# Patient Record
Sex: Female | Born: 1990 | Race: White | Hispanic: No | Smoking: Former smoker
Health system: Southern US, Community
[De-identification: ages and names within clinical notes are randomized; demographics above are authoritative.]

## PROBLEM LIST (undated history)

## (undated) ENCOUNTER — Inpatient Hospital Stay (HOSPITAL_COMMUNITY): Payer: Medicaid Other

## (undated) DIAGNOSIS — F419 Anxiety disorder, unspecified: Secondary | ICD-10-CM

## (undated) DIAGNOSIS — F32A Depression, unspecified: Secondary | ICD-10-CM

## (undated) HISTORY — DX: Anxiety disorder, unspecified: F41.9

## (undated) HISTORY — DX: Depression, unspecified: F32.A

## (undated) HISTORY — PX: NO PAST SURGERIES: SHX2092

---
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
## 2015-06-24 ENCOUNTER — Emergency Department
Admission: EM | Admit: 2015-06-24 | Discharge: 2015-06-24 | Disposition: A | Discharge status: Home / Self Care | Payer: Self-pay | Attending: Emergency Medicine | Admitting: Emergency Medicine

## 2015-06-24 ENCOUNTER — Encounter: Payer: Self-pay | Admitting: Emergency Medicine

## 2015-06-24 DIAGNOSIS — Z720 Tobacco use: Secondary | ICD-10-CM | POA: Insufficient documentation

## 2015-06-24 DIAGNOSIS — L509 Urticaria, unspecified: Secondary | ICD-10-CM | POA: Insufficient documentation

## 2015-06-24 MED ORDER — FAMOTIDINE 20 MG PO TABS
ORAL_TABLET | ORAL | Status: AC
  Administered 2015-06-24: 20 mg via ORAL
  Filled 2015-06-24: qty 1

## 2015-06-24 MED ORDER — DIPHENHYDRAMINE HCL 50 MG PO CAPS
ORAL_CAPSULE | ORAL | Status: AC
  Administered 2015-06-24: 50 mg via ORAL
  Filled 2015-06-24: qty 1

## 2015-06-24 MED ORDER — PREDNISONE 20 MG PO TABS: 40.0000 mg | ORAL_TABLET | Freq: Every day | ORAL | Status: AC

## 2015-06-24 MED ORDER — EPINEPHRINE 0.3 MG/0.3ML IJ SOAJ: 0.3000 mg | Freq: Once | INTRAMUSCULAR | Status: DC

## 2015-06-24 MED ORDER — PREDNISONE 20 MG PO TABS
60.0000 mg | ORAL_TABLET | Freq: Once | ORAL | Status: AC
  Administered 2015-06-24: 60 mg via ORAL

## 2015-06-24 MED ORDER — PREDNISONE 20 MG PO TABS
ORAL_TABLET | ORAL | Status: AC
  Administered 2015-06-24: 60 mg via ORAL
  Filled 2015-06-24: qty 3

## 2015-06-24 MED ORDER — FAMOTIDINE 20 MG PO TABS
20.0000 mg | ORAL_TABLET | Freq: Once | ORAL | Status: AC
  Administered 2015-06-24: 20 mg via ORAL

## 2015-06-24 MED ORDER — DIPHENHYDRAMINE HCL 50 MG PO CAPS
50.0000 mg | ORAL_CAPSULE | Freq: Once | ORAL | Status: AC
  Administered 2015-06-24: 50 mg via ORAL

## 2015-06-24 NOTE — ED Notes (Signed)
Patient stable at discharge.

## 2015-06-24 NOTE — ED Provider Notes (Signed)
[facility] Emergency Department Provider Note  ____________________________________________  Time seen: 6:15 AM  I have reviewed the triage vital signs and the nursing notes.   HISTORY  Chief Complaint Allergic Reaction     HPI Danielle Gillespie is a 24 y.o. female presents with generalize pruritic rash acute onset last night. Patient states after returning indoor from outdoor she started to experience generalized pruritus with resultant hives. Patient denies any difficulty breathing no difficulty swallowing. Patient denies any previous history of allergies.    Past medical history None  Past surgical history None Allergies No known drug allergies No family history on file.  Social History History  Substance Use Topics  . Smoking status: Current Every Day Smoker -- 1.00 packs/day    Types: Cigarettes  . Smokeless tobacco: Not on file  . Alcohol Use: No    Review of Systems  Constitutional: Negative for fever. Eyes: Negative for visual changes. ENT: Negative for sore throat. Cardiovascular: Negative for chest pain. Respiratory: Negative for shortness of breath. Gastrointestinal: Negative for abdominal pain, vomiting and diarrhea. Genitourinary: Negative for dysuria. Musculoskeletal: Negative for back pain. Skin: Positive for rash. Neurological: Negative for headaches, focal weakness or numbness.   10-point ROS otherwise negative.  ____________________________________________   PHYSICAL EXAM:  VITAL SIGNS: ED Triage Vitals  Enc Vitals Group     BP 06/24/15 0454 114/57 mmHg     Pulse Rate 06/24/15 0454 60     Resp 06/24/15 0454 18     Temp 06/24/15 0454 98 F (36.7 C)     Temp Source 06/24/15 0454 Oral     SpO2 06/24/15 0454 99 %     Weight 06/24/15 0454 125 lb (56.7 kg)     Height 06/24/15 0454 5\' 2"  (1.575 m)     Head Cir --      Peak Flow --      Pain Score --      Pain Loc --      Pain Edu? --      Excl. in GC? --       Constitutional: Alert and oriented. Well appearing and in no distress. Eyes: Conjunctivae are normal. PERRL. Normal extraocular movements. ENT   Head: Normocephalic and atraumatic.   Nose: No congestion/rhinnorhea.   Mouth/Throat: Mucous membranes are moist.   Neck: No stridor. Cardiovascular: Normal rate, regular rhythm. Normal and symmetric distal pulses are present in all extremities. No murmurs, rubs, or gallops. Respiratory: Normal respiratory effort without tachypnea nor retractions. Breath sounds are clear and equal bilaterally. No wheezes/rales/rhonchi. Gastrointestinal: Soft and nontender. No distention. There is no CVA tenderness. Genitourinary: deferred Musculoskeletal: Nontender with normal range of motion in all extremities. No joint effusions.  No lower extremity tenderness nor edema. Neurologic:  Normal speech and language. No gross focal neurologic deficits are appreciated. Speech is normal.  Skin:  Generalized urticaria involving posterior neck abdomen back and arms. Psychiatric: Mood and affect are normal. Speech and behavior are normal. Patient exhibits appropriate insight and judgment.      INITIAL IMPRESSION / ASSESSMENT AND PLAN / ED COURSE  Pertinent labs & imaging results that were available during my care of the patient were reviewed by me and considered in my medical decision making (see chart for details).  History of physical exam consistent with hives/urticaria most likely secondary to allergic reaction to unknown agent.  ____________________________________________   FINAL CLINICAL IMPRESSION(S) / ED DIAGNOSES  Final diagnoses:  Hives      Darci Current,  MD 06/25/15 0532

## 2015-06-24 NOTE — ED Notes (Signed)
Patient ambulatory to triage with steady gait, without difficulty or distress noted; pt reports since last night noted some generalized itching; awoke with some hives noted, no known cause

## 2015-06-24 NOTE — Discharge Instructions (Signed)
Hives Hives are itchy, red, swollen areas of the skin. They can vary in size and location on your body. Hives can come and go for hours or several days (acute hives) or for several weeks (chronic hives). Hives do not spread from person to person (noncontagious). They may get worse with scratching, exercise, and emotional stress. CAUSES   Allergic reaction to food, additives, or drugs.  Infections, including the common cold.  Illness, such as vasculitis, lupus, or thyroid disease.  Exposure to sunlight, heat, or cold.  Exercise.  Stress.  Contact with chemicals. SYMPTOMS   Red or white swollen patches on the skin. The patches may change size, shape, and location quickly and repeatedly.  Itching.  Swelling of the hands, feet, and face. This may occur if hives develop deeper in the skin. DIAGNOSIS  Your caregiver can usually tell what is wrong by performing a physical exam. Skin or blood tests may also be done to determine the cause of your hives. In some cases, the cause cannot be determined. TREATMENT  Mild cases usually get better with medicines such as antihistamines. Severe cases may require an emergency epinephrine injection. If the cause of your hives is known, treatment includes avoiding that trigger.  HOME CARE INSTRUCTIONS   Avoid causes that trigger your hives.  Take antihistamines as directed by your caregiver to reduce the severity of your hives. Non-sedating or low-sedating antihistamines are usually recommended. Do not drive while taking an antihistamine.  Take any other medicines prescribed for itching as directed by your caregiver.  Wear loose-fitting clothing.  Keep all follow-up appointments as directed by your caregiver. SEEK MEDICAL CARE IF:   You have persistent or severe itching that is not relieved with medicine.  You have painful or swollen joints. SEEK IMMEDIATE MEDICAL CARE IF:   You have a fever.  Your tongue or lips are swollen.  You have  trouble breathing or swallowing.  You feel tightness in the throat or chest.  You have abdominal pain. These problems may be the first sign of a life-threatening allergic reaction. Call your local emergency services (911 in [place] MAKE SURE YOU:   Understand these instructions.  Will watch your condition.  Will get help right away if you are not doing well or get worse. Document Released: 11/20/2005 Document Revised: 11/25/2013 Document Reviewed: 02/13/2012 Exit[employer] Patient Information 2015 [employer]. This information is not intended to replace advice given to you by your health care provider. Make sure you discuss any questions you have with your health care provider.  

## 2017-01-23 ENCOUNTER — Emergency Department
Admission: EM | Admit: 2017-01-23 | Discharge: 2017-01-23 | Disposition: A | Discharge status: Home / Self Care | Payer: Self-pay | Attending: Emergency Medicine | Admitting: Emergency Medicine

## 2017-01-23 DIAGNOSIS — Z79899 Other long term (current) drug therapy: Secondary | ICD-10-CM | POA: Insufficient documentation

## 2017-01-23 DIAGNOSIS — B349 Viral infection, unspecified: Secondary | ICD-10-CM | POA: Insufficient documentation

## 2017-01-23 DIAGNOSIS — R69 Illness, unspecified: Secondary | ICD-10-CM

## 2017-01-23 DIAGNOSIS — F17210 Nicotine dependence, cigarettes, uncomplicated: Secondary | ICD-10-CM | POA: Insufficient documentation

## 2017-01-23 DIAGNOSIS — J111 Influenza due to unidentified influenza virus with other respiratory manifestations: Secondary | ICD-10-CM

## 2017-01-23 LAB — INFLUENZA PANEL BY PCR (TYPE A & B)
Influenza A By PCR: NEGATIVE
Influenza B By PCR: NEGATIVE

## 2017-01-23 MED ORDER — IBUPROFEN 600 MG PO TABS: 600.0000 mg | ORAL_TABLET | Freq: Three times a day (TID) | ORAL | 0 refills | Status: DC | PRN

## 2017-01-23 MED ORDER — PSEUDOEPH-BROMPHEN-DM 30-2-10 MG/5ML PO SYRP: 5.0000 mL | ORAL_SOLUTION | Freq: Four times a day (QID) | ORAL | 0 refills | Status: DC | PRN

## 2017-01-23 MED ORDER — ONDANSETRON HCL 8 MG PO TABS: 8.0000 mg | ORAL_TABLET | Freq: Three times a day (TID) | ORAL | 0 refills | Status: DC | PRN

## 2017-01-23 MED ORDER — ACYCLOVIR 400 MG PO TABS: 400.0000 mg | ORAL_TABLET | Freq: Every day | ORAL | 0 refills | Status: AC

## 2017-01-23 MED ORDER — ONDANSETRON 8 MG PO TBDP
8.0000 mg | ORAL_TABLET | Freq: Once | ORAL | Status: AC
  Administered 2017-01-23: 8 mg via ORAL
  Filled 2017-01-23: qty 1

## 2017-01-23 MED ORDER — HYDROXYZINE HCL 50 MG PO TABS: 50.0000 mg | ORAL_TABLET | Freq: Three times a day (TID) | ORAL | 0 refills | Status: DC | PRN

## 2017-01-23 MED ORDER — KETOROLAC TROMETHAMINE 60 MG/2ML IM SOLN
60.0000 mg | Freq: Once | INTRAMUSCULAR | Status: AC
  Administered 2017-01-23: 60 mg via INTRAMUSCULAR
  Filled 2017-01-23: qty 2

## 2017-01-23 NOTE — ED Notes (Signed)
See triage note  States she developed fever,nasal congestion and body yesterday  Afebrile on arrival

## 2017-01-23 NOTE — ED Provider Notes (Signed)
[facility] Emergency Department Provider Note  ____________________________________________   First MD Initiated Contact with Patient 01/23/17 1112     (approximate)  I have reviewed the triage vital signs and the nursing notes.   HISTORY  Chief Complaint Fever    HPI Danielle Gillespie is a 26 y.o. female patient complaining of fever, bodyaches, nasal congestion for 3 days. Patient also state there is nausea but no vomiting. Patient states she has diarrhea. No palliative measures for this complaint. Patient has not taken a flu shot this season. She rates the pain as a 7/10. Patient however pain as "achy".   No past medical history on file.  There are no active problems to display for this patient.   No past surgical history on file.  Prior to Admission medications   Medication Sig Start Date End Date Taking? Authorizing Provider  acyclovir (ZOVIRAX) 400 MG tablet Take 1 tablet (400 mg total) by mouth 5 (five) times daily. 01/23/17 02/02/17  Joni Reining, PA-C  brompheniramine-pseudoephedrine-DM 30-2-10 MG/5ML syrup Take 5 mLs by mouth 4 (four) times daily as needed. 01/23/17   Joni Reining, PA-C  EPINEPHrine (EPIPEN 2-PAK) 0.3 mg/0.3 mL IJ SOAJ injection Inject 0.3 mLs (0.3 mg total) into the muscle once. 06/24/15   Darci Current, MD  hydrOXYzine (ATARAX/VISTARIL) 50 MG tablet Take 1 tablet (50 mg total) by mouth 3 (three) times daily as needed. 01/23/17   Joni Reining, PA-C  ibuprofen (ADVIL,MOTRIN) 600 MG tablet Take 1 tablet (600 mg total) by mouth every 8 (eight) hours as needed. 01/23/17   Joni Reining, PA-C  ondansetron (ZOFRAN) 8 MG tablet Take 1 tablet (8 mg total) by mouth every 8 (eight) hours as needed for nausea or vomiting. 01/23/17   Joni Reining, PA-C    Allergies Patient has no known allergies.  No family history on file.  Social History Social History  Substance Use Topics  . Smoking status: Current Every Day Smoker   Packs/day: 1.00    Types: Cigarettes  . Smokeless tobacco: Not on file  . Alcohol use No    Review of Systems Constitutional: Fever and body ache Eyes: No visual changes. ENT: No throat and nasal congestion Cardiovascular: Denies chest pain. Respiratory: Denies shortness of breath. Gastrointestinal: No abdominal pain.  No nausea, no vomiting.  No diarrhea.  No constipation. Genitourinary: Negative for dysuria. Musculoskeletal: Negative for back pain. Skin: Negative for rash. Neurological: Negative for headaches, focal weakness or numbness.    ____________________________________________   PHYSICAL EXAM:  VITAL SIGNS: ED Triage Vitals  Enc Vitals Group     BP 01/23/17 1026 100/60     Pulse Rate 01/23/17 1026 94     Resp 01/23/17 1026 18     Temp 01/23/17 1026 98.1 F (36.7 C)     Temp Source 01/23/17 1026 Oral     SpO2 01/23/17 1026 100 %     Weight 01/23/17 1026 145 lb (65.8 kg)     Height 01/23/17 1026 5\' 3"  (1.6 m)     Head Circumference --      Peak Flow --      Pain Score 01/23/17 1032 7     Pain Loc --      Pain Edu? --      Excl. in GC? --     Constitutional: Alert and oriented. Well appearing and in no acute distress. Eyes: Conjunctivae are normal. PERRL. EOMI. Head: Atraumatic. Nose: Edematous nasal turbinates with  clear rhinorrhea Mouth/Throat: Mucous membranes are moist.  Oropharynx non-erythematous. Neck: No stridor.  No cervical spine tenderness to palpation. Hematological/Lymphatic/Immunilogical: No cervical lymphadenopathy. Cardiovascular: Normal rate, regular rhythm. Grossly normal heart sounds.  Good peripheral circulation. Respiratory: Normal respiratory effort.  No retractions. Lungs CTAB. Gastrointestinal: Soft and nontender. No distention. No abdominal bruits. No CVA tenderness. Musculoskeletal: No lower extremity tenderness nor edema.  No joint effusions. Neurologic:  Normal speech and language. No gross focal neurologic deficits are  appreciated. No gait instability. Skin:  Skin is warm, dry and intact. No rash noted. Psychiatric: Mood and affect are normal. Speech and behavior are normal.  ____________________________________________   LABS (all labs ordered are listed, but only abnormal results are displayed)  Labs Reviewed  INFLUENZA PANEL BY PCR (TYPE A & B)   ____________________________________________  EKG   ____________________________________________  RADIOLOGY   ____________________________________________   PROCEDURES  Procedure(s) performed: None  Procedures  Critical Care performed: No  ____________________________________________   INITIAL IMPRESSION / ASSESSMENT AND PLAN / ED COURSE  Pertinent labs & imaging results that were available during my care of the patient were reviewed by me and considered in my medical decision making (see chart for details).  Viral illness. Patient advised  test was negative. Patient given discharge care instructions. Skin prescription from [facility] and ibuprofen. Patient given work excuse. Patient advised to [facility].      ____________________________________________   FINAL CLINICAL IMPRESSION(S) / ED DIAGNOSES  Final diagnoses:  Influenza-like illness      NEW MEDICATIONS STARTED DURING THIS VISIT:  New Prescriptions   ACYCLOVIR (ZOVIRAX) 400 MG TABLET    Take 1 tablet (400 mg total) by mouth 5 (five) times daily.   BROMPHENIRAMINE-PSEUDOEPHEDRINE-DM 30-2-10 MG/5ML SYRUP    Take 5 mLs by mouth 4 (four) times daily as needed.   HYDROXYZINE (ATARAX/VISTARIL) 50 MG TABLET    Take 1 tablet (50 mg total) by mouth 3 (three) times daily as needed.   IBUPROFEN (ADVIL,MOTRIN) 600 MG TABLET    Take 1 tablet (600 mg total) by mouth every 8 (eight) hours as needed.   ONDANSETRON (ZOFRAN) 8 MG TABLET    Take 1 tablet (8 mg total) by mouth every 8 (eight) hours as needed for nausea or vomiting.     Note:   This document was prepared using Dragon voice recognition software and may include unintentional dictation errors.    Joni Reiningonald K Chas Axel, PA-C 01/23/17 1247    Phineas SemenGraydon Goodman, MD 01/23/17 [phone]

## 2017-01-23 NOTE — ED Triage Notes (Signed)
Pt reports she began having fever, body aches nasal congestion Sunday.

## 2017-01-23 NOTE — ED Notes (Signed)
Pt alert and oriented X4, active, cooperative, pt in NAD. RR even and unlabored, color WNL.  Pt informed to return if any life threatening symptoms occur.   

## 2020-04-20 ENCOUNTER — Ambulatory Visit (INDEPENDENT_AMBULATORY_CARE_PROVIDER_SITE_OTHER): Payer: Medicaid Other | Admitting: Obstetrics and Gynecology

## 2020-04-20 ENCOUNTER — Other Ambulatory Visit: Payer: Self-pay

## 2020-04-20 ENCOUNTER — Encounter: Payer: Self-pay | Admitting: Obstetrics and Gynecology

## 2020-04-20 VITALS — BP 111/74 | HR 89 | Ht 63.0 in | Wt 168.4 lb

## 2020-04-20 DIAGNOSIS — N912 Amenorrhea, unspecified: Secondary | ICD-10-CM | POA: Diagnosis not present

## 2020-04-20 LAB — POCT URINE PREGNANCY: Preg Test, Ur: POSITIVE — AB

## 2020-04-20 NOTE — Progress Notes (Signed)
HPI:      [name] is a 29 y.o. G1P0 who LMP was Patient's last menstrual period was 03/01/2020.  Subjective:   She presents today for pregnancy confirmation.  She had been told earlier in her life that she could not become pregnant and so has had unprotected intercourse for more than 10 years.  She has now become pregnant.  She thought the reason for her inability to conceive had to do with cervical issues and some kind of a "dye test" that told her she had cancer of the cervix.  She reports normal Pap smears in the last few years.    Hx: The following portions of the patient's history were reviewed and updated as appropriate:             She  has no past medical history on file. She does not have a problem list on file. She  has no past surgical history on file. Her family history is not on file. She  reports that she quit smoking about 2 weeks ago. Her smoking use included cigarettes. She smoked 1.00 pack per day. She has never used smokeless tobacco. She reports previous drug use. She reports that she does not drink alcohol. She currently has no medications in their medication list. She has No Known Allergies.       Review of Systems:  Review of Systems  Constitutional: Denied constitutional symptoms, night sweats, recent illness, fatigue, fever, insomnia and weight loss.  Eyes: Denied eye symptoms, eye pain, photophobia, vision change and visual disturbance.  Ears/Nose/Throat/Neck: Denied ear, nose, throat or neck symptoms, hearing loss, nasal discharge, sinus congestion and sore throat.  Cardiovascular: Denied cardiovascular symptoms, arrhythmia, chest pain/pressure, edema, exercise intolerance, orthopnea and palpitations.  Respiratory: Denied pulmonary symptoms, asthma, pleuritic pain, productive sputum, cough, dyspnea and wheezing.  Gastrointestinal: Denied, gastro-esophageal reflux, melena, nausea and vomiting.  Genitourinary: Denied genitourinary symptoms including  symptomatic vaginal discharge, pelvic relaxation issues, and urinary complaints.  Musculoskeletal: Denied musculoskeletal symptoms, stiffness, swelling, muscle weakness and myalgia.  Dermatologic: Denied dermatology symptoms, rash and scar.  Neurologic: Denied neurology symptoms, dizziness, headache, neck pain and syncope.  Psychiatric: Denied psychiatric symptoms, anxiety and depression.  Endocrine: Denied endocrine symptoms including hot flashes and night sweats.   Meds:   No current outpatient medications on file prior to visit.   No current facility-administered medications on file prior to visit.    Objective:     Vitals:   04/20/20 1342  BP: 111/74  Pulse: 89              UPT positive  Assessment:    G1P0 There are no problems to display for this patient.    1. Amenorrhea     Approximately 7-week estimated gestational age based on LMP   Plan:            Prenatal Plan 1.  The patient was given prenatal literature. 2.  She was begun on prenatal vitamins. 3.  A prenatal lab panel was ordered or drawn. 4.  An ultrasound was ordered to better determine an EDC. 5.  A nurse visit was scheduled. 6.  Genetic testing and testing for other inheritable conditions discussed in detail. She will decide in the future whether to have these labs performed. 7.  A general overview of pregnancy testing, visit schedule, ultrasound schedule, and prenatal care was discussed. 8.  COVID and its risks associated with pregnancy, prevention by limiting exposure and use of masks, as well as  the risks and benefits of vaccination during pregnancy were discussed in detail.  [facility] policy regarding office and hospital visitation and testing was explained. 9.  Benefits of breast-feeding discussed in detail including both maternal and infant benefits. Ready Set Baby website discussed.   Orders Orders Placed This Encounter  Procedures  . US OB Transvaginal  . US OB Comp Less 14 Wks  . POCT  urine pregnancy    No orders of the defined types were placed in this encounter.     F/U  Return in about 5 weeks (around 05/25/2020). I spent 33 minutes involved in the care of this patient preparing to see the patient by obtaining and reviewing her medical history (including labs, imaging tests and prior procedures), documenting clinical information in the electronic health record (EHR), counseling and coordinating care plans, writing and sending prescriptions, ordering tests or procedures and directly communicating with the patient by discussing pertinent items from her history and physical exam as well as detailing my assessment and plan as noted above so that she has an informed understanding.  All of her questions were answered.  Danielle Gillespie, M.D. 04/20/2020 2:19 PM

## 2020-04-27 ENCOUNTER — Other Ambulatory Visit: Payer: Self-pay

## 2020-04-27 ENCOUNTER — Ambulatory Visit (INDEPENDENT_AMBULATORY_CARE_PROVIDER_SITE_OTHER): Payer: Medicaid Other

## 2020-04-27 DIAGNOSIS — Z3A01 Less than 8 weeks gestation of pregnancy: Secondary | ICD-10-CM

## 2020-04-27 DIAGNOSIS — N912 Amenorrhea, unspecified: Secondary | ICD-10-CM

## 2020-05-02 ENCOUNTER — Telehealth: Payer: Self-pay | Admitting: Nurse Practitioner

## 2020-05-02 ENCOUNTER — Ambulatory Visit (INDEPENDENT_AMBULATORY_CARE_PROVIDER_SITE_OTHER)
Admission: RE | Admit: 2020-05-02 | Discharge: 2020-05-02 | Disposition: A | Discharge status: Home / Self Care | Payer: Self-pay | Source: Ambulatory Visit

## 2020-05-02 DIAGNOSIS — Z3490 Encounter for supervision of normal pregnancy, unspecified, unspecified trimester: Secondary | ICD-10-CM

## 2020-05-02 DIAGNOSIS — J00 Acute nasopharyngitis [common cold]: Secondary | ICD-10-CM

## 2020-05-02 DIAGNOSIS — B3731 Acute candidiasis of vulva and vagina: Secondary | ICD-10-CM

## 2020-05-02 DIAGNOSIS — R509 Fever, unspecified: Secondary | ICD-10-CM

## 2020-05-02 DIAGNOSIS — N760 Acute vaginitis: Secondary | ICD-10-CM

## 2020-05-02 MED ORDER — FLUTICASONE PROPIONATE 50 MCG/ACT NA SUSP: 2.0000 | Freq: Every day | NASAL | 6 refills | Status: DC

## 2020-05-02 MED ORDER — CLOTRIMAZOLE 2 % VA CREA: 1.0000 | TOPICAL_CREAM | Freq: Every day | VAGINAL | 0 refills | Status: AC

## 2020-05-02 NOTE — Discharge Instructions (Addendum)
Start clotrimazole as directed. Continue to monitor, contact OBGYN if symptoms not improving, or recheck as scheduled. If having vaginal bleeding, abdominal pain, go to [facility] ED

## 2020-05-02 NOTE — Progress Notes (Signed)
We are sorry you are not feeling well.  Here is how we plan to help!  Based on what you have shared with me, it looks like you may have a viral upper respiratory infection.  Upper respiratory infections are caused by a large number of viruses; however, rhinovirus is the most common cause.   Symptoms vary from person to person, with common symptoms including sore throat, cough, fatigue or lack of energy and feeling of general discomfort.  A low-grade fever of up to 100.4 may present, but is often uncommon.  Symptoms vary however, and are closely related to a person's age or underlying illnesses.  The most common symptoms associated with an upper respiratory infection are nasal discharge or congestion, cough, sneezing, headache and pressure in the ears and face.  These symptoms usually persist for about 3 to 10 days, but can last up to 2 weeks.  It is important to know that upper respiratory infections do not cause serious illness or complications in most cases.    Upper respiratory infections can be transmitted from person to person, with the most common method of transmission being a person's hands.  The virus is able to live on the skin and can infect other persons for up to 2 hours after direct contact.  Also, these can be transmitted when someone coughs or sneezes; thus, it is important to cover the mouth to reduce this risk.  To keep the spread of the illness at [place], good hand hygiene is very important.  This is an infection that is most likely caused by a virus. There are no specific treatments other than to help you with the symptoms until the infection runs its course.  We are sorry you are not feeling well.  Here is how we plan to help!   For nasal congestion, you may use an oral decongestants such as Mucinex D or if you have glaucoma or high blood pressure use plain Mucinex.  Saline nasal spray or nasal drops can help and can safely be used as often as needed for congestion.  For your congestion,  I have prescribed Fluticasone nasal spray one spray in each nostril twice a day  If you do not have a history of heart disease, hypertension, diabetes or thyroid disease, prostate/bladder issues or glaucoma, you may also use Sudafed to treat nasal congestion.  It is highly recommended that you consult with a pharmacist or your primary care physician to ensure this medication is safe for you to take.     If you have a cough, you may use cough suppressants such as  Robitussin is safe during pregnancy.       If you have a sore or scratchy throat, use a saltwater gargle-  to  teaspoon of salt dissolved in a 4-ounce to 8-ounce glass of warm water.  Gargle the solution for approximately 15-30 seconds and then spit.  It is important not to swallow the solution.  You can also use throat lozenges/cough drops and Chloraseptic spray to help with throat pain or discomfort.  Warm or cold liquids can also be helpful in relieving throat pain.  For headache, pain or general discomfort, you can use Ibuprofen or Tylenol as directed.   Some authorities believe that zinc sprays or the use of Echinacea may shorten the course of your symptoms.   HOME CARE . Only take medications as instructed by your medical team. . Be sure to drink plenty of fluids. Water is fine as well as fruit  juices, sodas and electrolyte beverages. You may want to stay away from caffeine or alcohol. If you are nauseated, try taking small sips of liquids. How do you know if you are getting enough fluid? Your urine should be a pale yellow or almost colorless. . Get rest. . Taking a steamy shower or using a humidifier may help nasal congestion and ease sore throat pain. You can place a towel over your head and breathe in the steam from hot water coming from a faucet. . Using a saline nasal spray works much the same way. . Cough drops, hard candies and sore throat lozenges may ease your cough. . Avoid close contacts especially the very young and  the elderly . Cover your mouth if you cough or sneeze . Always remember to wash your hands.   GET HELP RIGHT AWAY IF: . You develop worsening fever. . If your symptoms do not improve within 10 days . You develop yellow or green discharge from your nose over 3 days. . You have coughing fits . You develop a severe head ache or visual changes. . You develop shortness of breath, difficulty breathing or start having chest pain . Your symptoms persist after you have completed your treatment plan  MAKE SURE YOU   Understand these instructions.  Will watch your condition.  Will get help right away if you are not doing well or get worse.  Your e-visit answers were reviewed by a board certified advanced clinical practitioner to complete your personal care plan. Depending upon the condition, your plan could have included both over the counter or prescription medications. Please review your pharmacy choice. If there is a problem, you may call our nursing hot line at and have the prescription routed to another pharmacy. Your safety is important to [place]. If you have drug allergies check your prescription carefully.   You can use MyChart to ask questions about today's visit, request a non-urgent call back, or ask for a work or school excuse for 24 hours related to this e-Visit. If it has been greater than 24 hours you will need to follow up with your provider, or enter a new e-Visit to address those concerns. You will get an e-mail in the next two days asking about your experience.  I hope that your e-visit has been valuable and will speed your recovery. Thank you for using e-visits.    5-10 minutes spent reviewing and documenting in chart.

## 2020-05-02 NOTE — Progress Notes (Signed)
For the safety of you and your child, I recommend a face to face office visit with a health care provider. sound like yeast infection but that does not cause a fever. You will need to ciontact your OB or go to urgent care for treatment Many mothers need to take medicines during their pregnancy and while nursing.  Almost all medicines pass into the breast milk in small quantities.  Most are generally considered safe for a mother to take but some medicines must be avoided.  After reviewing your E-Visit request, I recommend that you consult your OB/GYN or pediatrician for medical advice in relation to your condition and prescription medications while pregnant or breastfeeding. NOTE: If you entered your credit card information for this eVisit, you will not be charged. You may see a "hold" on your card for the $35 but that hold will drop off and you will not have a charge processed.  If you are having a true medical emergency please call 911.     For an urgent face to face visit, [place] has four urgent care centers for your convenience:    NEW:  [facility] [place] [phone] [address] [place], [place] [address] .  Monday - Friday 10 am - 6 pm     . [facility]    [phone]                  Get Driving Directions  [address] [place], [place] [address] . 10 am to 8 pm Monday-Friday . 12 pm to 8 pm Saturday-Sunday   . [facility] at [facility]  [phone]                  Get Driving Directions  [address] [place], [place] [address] . 8 am to 8 pm Monday-Friday . 9 am to 6 pm Saturday . 11 am to 6 pm Sunday   . [facility] at [facility]  [phone]                  Get Driving Directions   [address] [place], [place] [address] . 8 am to 8 pm Monday-Friday . 8 am to 4 pm Saturday-Sunday    . [facility] at  [facility] Directions  [phone]  [address] [place], [place] [address]  . Monday-Friday, 12 PM to 6 PM    Your e-visit answers were reviewed by a board certified advanced clinical practitioner to complete your personal care plan.  Thank you for using e-Visits.

## 2020-05-02 NOTE — ED Provider Notes (Signed)
Virtual Visit via Video Note:  Danielle Gillespie  initiated request for Telemedicine visit with [facility]gent [facility] team. I connected with Danielle Gillespie  on 05/02/2020 at 10:00 AM  for a synchronized telemedicine visit using a video enabled HIPPA compliant telemedicine application. I verified that I am speaking with Danielle Gillespie  using two identifiers. Drakkar Medeiros Doree Fudge, PA-C  was physically located in a [facility] Urgent care site and Kimmerly Lora was located at a different location.   The limitations of evaluation and management by telemedicine as well as the availability of in-person appointments were discussed. Patient was informed that she  may incur a bill ( including co-pay) for this virtual visit encounter. Danielle Gillespie  expressed understanding and gave verbal consent to proceed with virtual visit.     History of Present Illness:Danielle Gillespie  is a 29 y.o. female presents with few day history of vaginal itching, discomfort with mild discharge. Patient is [redacted] weeks pregnant, has seen OB and next appointment in 5 days. Denies abdominal pain, vaginal bleeding. No changes to urinary symptoms. Patient states has had these symptoms in the past with new supplements. Started prenatal vitamins recently.   No past medical history on file.  No Known Allergies      Observations/Objective: General: Well appearing, nontoxic, no acute distress. Sitting comfortably. Head: Normocephalic, atraumatic Eye: No conjunctival injection, eyelid swelling. EOMI ENT: Mucus membranes moist, no lip cracking. No obvious nasal drainage. Pulm: Speaking in full sentences without difficulty. Normal effort. No respiratory distress, accessory muscle use. Neuro: Normal mental status. Alert and oriented x 3.  Assessment and Plan: Will start clotrimazole topical for possible yeast vaginitis. Return precautions given, otherwise to follow up as scheduled with OB, sooner if symptoms worsen. Patient expresses understanding and  agrees to plan.  Follow Up Instructions:    I discussed the assessment and treatment plan with the patient. The patient was provided an opportunity to ask questions and all were answered. The patient agreed with the plan and demonstrated an understanding of the instructions.   The patient was advised to call back or seek an in-person evaluation if the symptoms worsen or if the condition fails to improve as anticipated.  I provided 10 minutes of non-face-to-face time during this encounter.    Belinda Fisher, PA-C  05/02/2020 10:00 AM         Belinda Fisher, PA-C 05/02/20 1036

## 2020-05-04 ENCOUNTER — Encounter: Payer: Self-pay | Admitting: Obstetrics and Gynecology

## 2020-05-04 ENCOUNTER — Ambulatory Visit (INDEPENDENT_AMBULATORY_CARE_PROVIDER_SITE_OTHER): Payer: Medicaid Other | Admitting: Obstetrics and Gynecology

## 2020-05-04 ENCOUNTER — Other Ambulatory Visit: Payer: Self-pay

## 2020-05-04 VITALS — BP 113/78 | HR 97 | Ht 63.0 in | Wt 169.0 lb

## 2020-05-04 DIAGNOSIS — B3731 Acute candidiasis of vulva and vagina: Secondary | ICD-10-CM

## 2020-05-04 DIAGNOSIS — B373 Candidiasis of vulva and vagina: Secondary | ICD-10-CM | POA: Diagnosis not present

## 2020-05-04 NOTE — Progress Notes (Signed)
HPI:      [name] is a 29 y.o. G1P0 who LMP was Patient's last menstrual period was 03/01/2020.  Subjective:   She presents today after doing a televisit with complaint of severe burning with urination and vaginal discharge with itching.  She was prescribed Chlortrimazole and states that her symptoms are significantly reduced today. She denies ulcerated lesions and the pain with urination has resolved. She is approximately 9 weeks estimated gestational age.    Hx: The following portions of the patient's history were reviewed and updated as appropriate:             She  has no past medical history on file. She does not have a problem list on file. She  has no past surgical history on file. Her family history is not on file. She  reports that she quit smoking about 4 weeks ago. Her smoking use included cigarettes. She smoked 1.00 pack per day. She has never used smokeless tobacco. She reports previous drug use. She reports that she does not drink alcohol. She has a current medication list which includes the following prescription(s): clotrimazole and fluticasone. She has No Known Allergies.       Review of Systems:  Review of Systems  Constitutional: Denied constitutional symptoms, night sweats, recent illness, fatigue, fever, insomnia and weight loss.  Eyes: Denied eye symptoms, eye pain, photophobia, vision change and visual disturbance.  Ears/Nose/Throat/Neck: Denied ear, nose, throat or neck symptoms, hearing loss, nasal discharge, sinus congestion and sore throat.  Cardiovascular: Denied cardiovascular symptoms, arrhythmia, chest pain/pressure, edema, exercise intolerance, orthopnea and palpitations.  Respiratory: Denied pulmonary symptoms, asthma, pleuritic pain, productive sputum, cough, dyspnea and wheezing.  Gastrointestinal: Denied, gastro-esophageal reflux, melena, nausea and vomiting.  Genitourinary: See HPI for additional information.  Musculoskeletal: Denied  musculoskeletal symptoms, stiffness, swelling, muscle weakness and myalgia.  Dermatologic: Denied dermatology symptoms, rash and scar.  Neurologic: Denied neurology symptoms, dizziness, headache, neck pain and syncope.  Psychiatric: Denied psychiatric symptoms, anxiety and depression.  Endocrine: Denied endocrine symptoms including hot flashes and night sweats.   Meds:   Current Outpatient Medications on File Prior to Visit  Medication Sig Dispense Refill  . clotrimazole (GYNE-LOTRIMIN 3) 2 % vaginal cream Place 1 Applicatorful vaginally at bedtime for 3 days. 21 g 0  . fluticasone (FLONASE) 50 MCG/ACT nasal spray Place 2 sprays into both nostrils daily. 16 g 6   No current facility-administered medications on file prior to visit.    Objective:     Vitals:   05/04/20 1038  BP: 113/78  Pulse: 97              Physical examination   Pelvic:  Vulva:  Some erythema and some excoriated areas noted especially the anterior aspect and clitoral hood.  Lesions do not appear consistent with HSV  Vagina: No lesions or abnormalities noted.  Support: Normal pelvic support.  Urethra No masses tenderness or scarring.  Meatus Normal size without lesions or prolapse.  Cervix:   Anus: Normal exam.  No lesions.  Perineum: Normal exam.  No lesions.     Assessment:    G1P0 There are no problems to display for this patient.    1. Monilial vaginitis     Patient improving with use of Chlortrimazole   Plan:            1.  Continue medication as directed.  2.  Follow-up for nurse visit and new OB as scheduled. Orders No orders of the  defined types were placed in this encounter.   No orders of the defined types were placed in this encounter.     F/U  Return for Next Scheduled Follow-up. I spent 21 minutes involved in the care of this patient preparing to see the patient by obtaining and reviewing her medical history (including labs, imaging tests and prior procedures), documenting  clinical information in the electronic health record (EHR), counseling and coordinating care plans, writing and sending prescriptions, ordering tests or procedures and directly communicating with the patient by discussing pertinent items from her history and physical exam as well as detailing my assessment and plan as noted above so that she has an informed understanding.  All of her questions were answered.  Elonda Husky, M.D. 05/04/2020 10:55 AM

## 2020-05-07 ENCOUNTER — Other Ambulatory Visit: Payer: Self-pay

## 2020-05-07 ENCOUNTER — Ambulatory Visit (INDEPENDENT_AMBULATORY_CARE_PROVIDER_SITE_OTHER): Payer: Medicaid Other | Admitting: Certified Nurse Midwife

## 2020-05-07 DIAGNOSIS — Z0283 Encounter for blood-alcohol and blood-drug test: Secondary | ICD-10-CM

## 2020-05-07 DIAGNOSIS — Z3401 Encounter for supervision of normal first pregnancy, first trimester: Secondary | ICD-10-CM

## 2020-05-07 DIAGNOSIS — Z113 Encounter for screening for infections with a predominantly sexual mode of transmission: Secondary | ICD-10-CM

## 2020-05-07 NOTE — Patient Instructions (Signed)
WHAT OB PATIENTS CAN EXPECT   Confirmation of pregnancy and ultrasound ordered if medically indicated-[redacted] weeks gestation  New OB (NOB) intake with nurse and New OB (NOB) labs- [redacted] weeks gestation  New OB (NOB) physical examination with provider- 11/[redacted] weeks gestation  Flu vaccine-[redacted] weeks gestation  Anatomy scan-[redacted] weeks gestation  Glucose tolerance test, blood work to test for anemia, T-dap vaccine-[redacted] weeks gestation  Vaginal swabs/cultures-STD/Group B strep-[redacted] weeks gestation  Appointments every 4 weeks until 28 weeks  Every 2 weeks from 28 weeks until 36 weeks  Weekly visits from 36 weeks until delivery  Second Trimester of Pregnancy  The second trimester is from week 14 through week 27 (month 4 through 6). This is often the time in pregnancy that you feel your best. Often times, morning sickness has lessened or quit. You may have more energy, and you may get hungry more often. Your unborn baby is growing rapidly. At the end of the sixth month, he or she is about 9 inches long and weighs about 1 pounds. You will likely feel the baby move between 18 and 20 weeks of pregnancy. Follow these instructions at home: Medicines  Take over-the-counter and prescription medicines only as told by your doctor. Some medicines are safe and some medicines are not safe during pregnancy.  Take a prenatal vitamin that contains at least 600 micrograms (mcg) of folic acid.  If you have trouble pooping (constipation), take medicine that will make your stool soft (stool softener) if your doctor approves. Eating and drinking   Eat regular, healthy meals.  Avoid raw meat and uncooked cheese.  If you get low calcium from the food you eat, talk to your doctor about taking a daily calcium supplement.  Avoid foods that are high in fat and sugars, such as fried and sweet foods.  If you feel sick to your stomach (nauseous) or throw up (vomit): ? Eat 4 or 5 small meals a day instead of 3 large  meals. ? Try eating a few soda crackers. ? Drink liquids between meals instead of during meals.  To prevent constipation: ? Eat foods that are high in fiber, like fresh fruits and vegetables, whole grains, and beans. ? Drink enough fluids to keep your pee (urine) clear or pale yellow. Activity  Exercise only as told by your doctor. Stop exercising if you start to have cramps.  Do not exercise if it is too hot, too humid, or if you are in a place of great height (high altitude).  Avoid heavy lifting.  Wear low-heeled shoes. Sit and stand up straight.  You can continue to have sex unless your doctor tells you not to. Relieving pain and discomfort  Wear a good support bra if your breasts are tender.  Take warm water baths (sitz baths) to soothe pain or discomfort caused by hemorrhoids. Use hemorrhoid cream if your doctor approves.  Rest with your legs raised if you have leg cramps or low back pain.  If you develop puffy, bulging veins (varicose veins) in your legs: ? Wear support hose or compression stockings as told by your doctor. ? Raise (elevate) your feet for 15 minutes, 3-4 times a day. ? Limit salt in your food. Prenatal care  Write down your questions. Take them to your prenatal visits.  Keep all your prenatal visits as told by your doctor. This is important. Safety  Wear your seat belt when driving.  Make a list of emergency phone numbers, including numbers for family, friends, the  hospital, and police and fire departments. General instructions  Ask your doctor about the right foods to eat or for help finding a counselor, if you need these services.  Ask your doctor about local prenatal classes. Begin classes before month 6 of your pregnancy.  Do not use hot tubs, steam rooms, or saunas.  Do not douche or use tampons or scented sanitary pads.  Do not cross your legs for long periods of time.  Visit your dentist if you have not done so. Use a soft toothbrush  to brush your teeth. Floss gently.  Avoid all smoking, herbs, and alcohol. Avoid drugs that are not approved by your doctor.  Do not use any products that contain nicotine or tobacco, such as cigarettes and e-cigarettes. If you need help quitting, ask your doctor.  Avoid cat litter boxes and soil used by cats. These carry germs that can cause birth defects in the baby and can cause a loss of your baby (miscarriage) or stillbirth. Contact a doctor if:  You have mild cramps or pressure in your lower belly.  You have pain when you pee (urinate).  You have bad smelling fluid coming from your vagina.  You continue to feel sick to your stomach (nauseous), throw up (vomit), or have watery poop (diarrhea).  You have a nagging pain in your belly area.  You feel dizzy. Get help right away if:  You have a fever.  You are leaking fluid from your vagina.  You have spotting or bleeding from your vagina.  You have severe belly cramping or pain.  You lose or gain weight rapidly.  You have trouble catching your breath and have chest pain.  You notice sudden or extreme puffiness (swelling) of your face, hands, ankles, feet, or legs.  You have not felt the baby move in over an hour.  You have severe headaches that do not go away when you take medicine.  You have trouble seeing. Summary  The second trimester is from week 14 through week 27 (months 4 through 6). This is often the time in pregnancy that you feel your best.  To take care of yourself and your unborn baby, you will need to eat healthy meals, take medicines only if your doctor tells you to do so, and do activities that are safe for you and your baby.  Call your doctor if you get sick or if you notice anything unusual about your pregnancy. Also, call your doctor if you need help with the right food to eat, or if you want to know what activities are safe for you. This information is not intended to replace advice given to you by  your health care provider. Make sure you discuss any questions you have with your health care provider. Document Revised: 03/14/2019 Document Reviewed: 12/26/2016 Elsevier Patient Education  2020 [employer]. Prenatal Care Prenatal care is health care during pregnancy. It helps you and your unborn baby (fetus) stay as healthy as possible. Prenatal care may be provided by a midwife, a family practice health care provider, or a childbirth and pregnancy specialist (obstetrician). How does this affect me? During pregnancy, you will be closely monitored for any new conditions that might develop. To lower your risk of pregnancy complications, you and your health care provider will talk about any underlying conditions you have. How does this affect my baby? Early and consistent prenatal care increases the chance that your baby will be healthy during pregnancy. Prenatal care lowers the risk that your  baby will be:  Born early (prematurely).  Smaller than expected at birth (small for gestational age). What can I expect at the first prenatal care visit? Your first prenatal care visit will likely be the longest. You should schedule your first prenatal care visit as soon as you know that you are pregnant. Your first visit is a good time to talk about any questions or concerns you have about pregnancy. At your visit, you and your health care provider will talk about:  Your medical history, including: ? Any past pregnancies. ? Your family's medical history. ? The baby's father's medical history. ? Any long-term (chronic) health conditions you have and how you manage them. ? Any surgeries or procedures you have had. ? Any current over-the-counter or prescription medicines, herbs, or supplements you are taking.  Other factors that could pose a risk to your baby, including:  Your home setting and your stress levels, including: ? Exposure to abuse or violence. ? Household financial strain. ? Mental  health conditions you have.  Your daily health habits, including diet and exercise. Your health care provider will also:  Measure your weight, height, and blood pressure.  Do a physical exam, including a pelvic and breast exam.  Perform blood tests and urine tests to check for: ? Urinary tract infection. ? Sexually transmitted infections (STIs). ? Low iron levels in your blood (anemia). ? Blood type and certain proteins on red blood cells (Rh antibodies). ? Infections and immunity to viruses, such as hepatitis B and rubella. ? HIV (human immunodeficiency virus).  Do an ultrasound to confirm your baby's growth and development and to help predict your estimated due date (EDD). This ultrasound is done with a probe that is inserted into the vagina (transvaginal ultrasound).  Discuss your options for genetic screening.  Give you information about how to keep yourself and your baby healthy, including: ? Nutrition and taking vitamins. ? Physical activity. ? How to manage pregnancy symptoms such as nausea and vomiting (morning sickness). ? Infections and substances that may be harmful to your baby and how to avoid them. ? Food safety. ? Dental care. ? Working. ? Travel. ? Warning signs to watch for and when to call your health care provider. How often will I have prenatal care visits? After your first prenatal care visit, you will have regular visits throughout your pregnancy. The visit schedule is often as follows:  Up to week 28 of pregnancy: once every 4 weeks.  28-36 weeks: once every 2 weeks.  After 36 weeks: every week until delivery. Some women may have visits more or less often depending on any underlying health conditions and the health of the baby. Keep all follow-up and prenatal care visits as told by your health care provider. This is important. What happens during routine prenatal care visits? Your health care provider will:  Measure your weight and blood  pressure.  Check for fetal heart sounds.  Measure the height of your uterus in your abdomen (fundal height). This may be measured starting around week 20 of pregnancy.  Check the position of your baby inside your uterus.  Ask questions about your diet, sleeping patterns, and whether you can feel the baby move.  Review warning signs to watch for and signs of labor.  Ask about any pregnancy symptoms you are having and how you are dealing with them. Symptoms may include: ? Headaches. ? Nausea and vomiting. ? Vaginal discharge. ? Swelling. ? Fatigue. ? Constipation. ? Any discomfort, including back  or pelvic pain. Make a list of questions to ask your health care provider at your routine visits. What tests might I have during prenatal care visits? You may have blood, urine, and imaging tests throughout your pregnancy, such as:  Urine tests to check for glucose, protein, or signs of infection.  Glucose tests to check for a form of diabetes that can develop during pregnancy (gestational diabetes mellitus). This is usually done around week 24 of pregnancy.  An ultrasound to check your baby's growth and development and to check for birth defects. This is usually done around week 20 of pregnancy.  A test to check for group B strep (GBS) infection. This is usually done around week 36 of pregnancy.  Genetic testing. This may include blood or imaging tests, such as an ultrasound. Some genetic tests are done during the first trimester and some are done during the second trimester. What else can I expect during prenatal care visits? Your health care provider may recommend getting certain vaccines during pregnancy. These may include:  A yearly flu shot (annual influenza vaccine). This is especially important if you will be pregnant during flu season.  Tdap (tetanus, diphtheria, pertussis) vaccine. Getting this vaccine during pregnancy can protect your baby from whooping cough (pertussis) after  birth. This vaccine may be recommended between weeks 27 and 36 of pregnancy. Later in your pregnancy, your health care provider may give you information about:  Childbirth and breastfeeding classes.  Choosing a health care provider for your baby.  Umbilical cord banking.  Breastfeeding.  Birth control after your baby is born.  The hospital labor and delivery unit and how to tour it.  Registering at the hospital before you go into labor. Where to find more information  Office on Women's Health: LegalWarrants.gl  American Pregnancy Association: americanpregnancy.org  March of Dimes: marchofdimes.org Summary  Prenatal care helps you and your baby stay as healthy as possible during pregnancy.  Your first prenatal care visit will most likely be the longest.  You will have visits and tests throughout your pregnancy to monitor your health and your baby's health.  Bring a list of questions to your visits to ask your health care provider.  Make sure to keep all follow-up and prenatal care visits with your health care provider. This information is not intended to replace advice given to you by your health care provider. Make sure you discuss any questions you have with your health care provider. Document Revised: 03/12/2019 Document Reviewed: 11/19/2017 Elsevier Patient Education  [place]. Morning Sickness  Morning sickness is when you feel sick to your stomach (nauseous) during pregnancy. You may feel sick to your stomach and throw up (vomit). You may feel sick in the morning, but you can feel this way at any time of day. Some women feel very sick to their stomach and cannot stop throwing up (hyperemesis gravidarum). Follow these instructions at home: Medicines  Take over-the-counter and prescription medicines only as told by your doctor. Do not take any medicines until you talk with your doctor about them first.  Taking multivitamins before getting pregnant can stop or  lessen the harshness of morning sickness. Eating and drinking  Eat dry toast or crackers before getting out of bed.  Eat 5 or 6 small meals a day.  Eat dry and bland foods like rice and baked potatoes.  Do not eat greasy, fatty, or spicy foods.  Have someone cook for you if the smell of food causes you to  feel sick or throw up.  If you feel sick to your stomach after taking prenatal vitamins, take them at night or with a snack.  Eat protein when you need a snack. Nuts, yogurt, and cheese are good choices.  Drink fluids throughout the day.  Try ginger ale made with real ginger, ginger tea made from fresh grated ginger, or ginger candies. General instructions  Do not use any products that have nicotine or tobacco in them, such as cigarettes and e-cigarettes. If you need help quitting, ask your doctor.  Use an air purifier to keep the air in your house free of smells.  Get lots of fresh air.  Try to avoid smells that make you feel sick.  Try: ? Wearing a bracelet that is used for seasickness (acupressure wristband). ? Going to a doctor who puts thin needles into certain body points (acupuncture) to improve how you feel. Contact a doctor if:  You need medicine to feel better.  You feel dizzy or light-headed.  You are losing weight. Get help right away if:  You feel very sick to your stomach and cannot stop throwing up.  You pass out (faint).  You have very bad pain in your belly. Summary  Morning sickness is when you feel sick to your stomach (nauseous) during pregnancy.  You may feel sick in the morning, but you can feel this way at any time of day.  Making some changes to what you eat may help your symptoms go away. This information is not intended to replace advice given to you by your health care provider. Make sure you discuss any questions you have with your health care provider. Document Revised: 11/02/2017 Document Reviewed: 12/21/2016 Elsevier Patient  Education  2020 [employer]. How a Baby Grows During Pregnancy  Pregnancy begins when a female's sperm enters a female's egg (fertilization). Fertilization usually happens in one of the tubes (fallopian tubes) that connect the ovaries to the womb (uterus). The fertilized egg moves down the fallopian tube to the uterus. Once it reaches the uterus, it implants into the lining of the uterus and begins to grow. For the first 10 weeks, the fertilized egg is called an embryo. After 10 weeks, it is called a fetus. As the fetus continues to grow, it receives oxygen and nutrients through tissue (placenta) that grows to support the developing baby. The placenta is the life support system for the baby. It provides oxygen and nutrition and removes waste. Learning as much as you can about your pregnancy and how your baby is developing can help you enjoy the experience. It can also make you aware of when there might be a problem and when to ask questions. How long does a typical pregnancy last? A pregnancy usually lasts 280 days, or about 40 weeks. Pregnancy is divided into three periods of growth, also called trimesters:  First trimester: 0-12 weeks.  Second trimester: 13-27 weeks.  Third trimester: 28-40 weeks. The day when your baby is ready to be born (full term) is your estimated date of delivery. How does my baby develop month by month? First month  The fertilized egg attaches to the inside of the uterus.  Some cells will form the placenta. Others will form the fetus.  The arms, legs, brain, spinal cord, lungs, and heart begin to develop.  At the end of the first month, the heart begins to beat. Second month  The bones, inner ear, eyelids, hands, and feet form.  The genitals develop.  By  the end of 8 weeks, all major organs are developing. Third month  All of the internal organs are forming.  Teeth develop below the gums.  Bones and muscles begin to grow. The spine can flex.  The skin  is transparent.  Fingernails and toenails begin to form.  Arms and legs continue to grow longer, and hands and feet develop.  The fetus is about 3 inches (7.6 cm) long. Fourth month  The placenta is completely formed.  The external sex organs, neck, outer ear, eyebrows, eyelids, and fingernails are formed.  The fetus can hear, swallow, and move its arms and legs.  The kidneys begin to produce urine.  The skin is covered with a white, waxy coating (vernix) and very fine hair (lanugo). Fifth month  The fetus moves around more and can be felt for the first time (quickening).  The fetus starts to sleep and wake up and may begin to suck its finger.  The nails grow to the end of the fingers.  The organ in the digestive system that makes bile (gallbladder) functions and helps to digest nutrients.  If your baby is a girl, eggs are present in her ovaries. If your baby is a boy, testicles start to move down into his scrotum. Sixth month  The lungs are formed.  The eyes open. The brain continues to develop.  Your baby has fingerprints and toe prints. Your baby's hair grows thicker.  At the end of the second trimester, the fetus is about 9 inches (22.9 cm) long. Seventh month  The fetus kicks and stretches.  The eyes are developed enough to sense changes in light.  The hands can make a grasping motion.  The fetus responds to sound. Eighth month  All organs and body systems are fully developed and functioning.  Bones harden, and taste buds develop. The fetus may hiccup.  Certain areas of the brain are still developing. The skull remains soft. Ninth month  The fetus gains about  lb (0.23 kg) each week.  The lungs are fully developed.  Patterns of sleep develop.  The fetus's head typically moves into a head-down position (vertex) in the uterus to prepare for birth.  The fetus weighs 6-9 lb (2.72-4.08 kg) and is 19-20 inches (48.26-50.8 cm) long. What can I do to  have a healthy pregnancy and help my baby develop? General instructions  Take prenatal vitamins as directed by your health care provider. These include vitamins such as folic acid, iron, calcium, and vitamin D. They are important for healthy development.  Take medicines only as directed by your health care provider. Read labels and ask a pharmacist or your health care provider whether over-the-counter medicines, supplements, and prescription drugs are safe to take during pregnancy.  Keep all follow-up visits as directed by your health care provider. This is important. Follow-up visits include prenatal care and screening tests. How do I know if my baby is developing well? At each prenatal visit, your health care provider will do several different tests to check on your health and keep track of your baby's development. These include:  Fundal height and position. ? Your health care provider will measure your growing belly from your pubic bone to the top of the uterus using a tape measure. ? Your health care provider will also feel your belly to determine your baby's position.  Heartbeat. ? An ultrasound in the first trimester can confirm pregnancy and show a heartbeat, depending on how far along you are. ? Your  health care provider will check your baby's heart rate at every prenatal visit.  Second trimester ultrasound. ? This ultrasound checks your baby's development. It also may show your baby's gender. What should I do if I have concerns about my baby's development? Always talk with your health care provider about any concerns that you may have about your pregnancy and your baby. Summary  A pregnancy usually lasts 280 days, or about 40 weeks. Pregnancy is divided into three periods of growth, also called trimesters.  Your health care provider will monitor your baby's growth and development throughout your pregnancy.  Follow your health care provider's recommendations about taking prenatal  vitamins and medicines during your pregnancy.  Talk with your health care provider if you have any concerns about your pregnancy or your developing baby. This information is not intended to replace advice given to you by your health care provider. Make sure you discuss any questions you have with your health care provider. Document Revised: 03/13/2019 Document Reviewed: 10/03/2017 Elsevier Patient Education  [place] [employer] Gain During Pregnancy, Adult A certain amount of weight gain during pregnancy is normal and healthy. How much weight you should gain depends on your overall health and a measurement called BMI (body mass index). BMI is an estimate of your body fat based on your height and weight. You can use an Fre[employer] to figure out your BMI, or you can ask your health care provider to calculate it for you at your next visit. Your recommended pregnancy weight gain is based on your pre-pregnancy BMI. General guidelines for a healthy total weight gain during pregnancy are listed below. If your BMI at or before the start of your pregnancy is:  Less than 18.5 (underweight), you should gain 28-40 lb (13-18 kg).  18.5-24.9 (normal weight), you should gain 25-35 lb (11-16 kg).  25-29.9 (overweight), you should gain 15-25 lb (7-11 kg).  30 or higher (obese), you should gain 11-20 lb (5-9 kg). These ranges vary depending on your individual health. If you are carrying more than one baby (multiples), it may be safe to gain more weight than these recommendations. If you gain less weight than recommended, that may be safe as long as your baby is growing and developing normally. How can unhealthy weight gain affect me and my baby? Gaining too much weight during pregnancy can lead to pregnancy complications, such as:  A temporary form of diabetes that develops during pregnancy (gestational diabetes).  High blood pressure during pregnancy and protein in your urine  (preeclampsia).  High blood pressure during pregnancy without protein in your urine (gestational hypertension).  Your baby having a high weight at birth, which may: ? Raise your risk of having a more difficult delivery or a surgical delivery (cesarean delivery, or C-section). ? Raise your child's risk of developing obesity during childhood. Not gaining enough weight can be life-threatening for your baby, and it may raise your baby's chances of:  Being born early (preterm).  Growing more slowly than normal during pregnancy (growth restriction).  Having a low weight at birth. What actions can I take to gain a healthy amount of weight during pregnancy? General instructions  Keep track of your weight gain during pregnancy.  Take over-the-counter and prescription medicines only as told by your health care provider. Take all prenatal supplements as directed.  Keep all health care visits during pregnancy (prenatal visits). These visits are a good time to discuss your weight gain. Your health care provider will weigh  you at each visit to make sure you are gaining a healthy amount of weight. Nutrition   Eat a balanced, nutrient-rich diet. Eat plenty of: ? Fruits and vegetables, such as berries and broccoli. ? Whole grains, such as millet, barley, whole-wheat breads and cereals, and oatmeal. ? Low-fat dairy products or non-dairy products such as almond milk or rice milk. ? Protein foods, such as lean meat, chicken, eggs, and legumes (such as peas, beans, soybeans, and lentils).  Avoid foods that are fried or have a lot of fat, salt (sodium), or sugar.  Drink enough fluid to keep your urine pale yellow.  Choose healthy snack and drink options when you are at work or on the go: ? Drink water. Avoid soda, sports drinks, and juices that have added sugar. ? Avoid drinks with caffeine, such as coffee and energy drinks. ? Eat snacks that are high in protein, such as nuts, protein bars, and  low-fat yogurt. ? Carry convenient snacks in your purse that do not need refrigeration, such as a pack of trail mix, an apple, or a granola bar.  If you need help improving your diet, work with a health care provider or a diet and nutrition specialist (dietitian). Activity   Exercise regularly, as told by your health care provider. ? If you were active before becoming pregnant, you may be able to continue your regular fitness activities. ? If you were not active before pregnancy, you may gradually build up to exercising for 30 or more minutes on most days of the week. This may include walking, swimming, or yoga.  Ask your health care provider what activities are safe for you. Talk with your health care provider about whether you may need to be excused from certain school or work activities. Where to find more information Learn more about managing your weight gain during pregnancy from:  American Pregnancy Association: www.americanpregnancy.org  [place] Department of Agriculture pregnancy weight gain calculator: Former[employer] Summary  Too much weight gain during pregnancy can lead to complications for you and your baby.  Find out your pre-pregnancy BMI to determine how much weight gain is healthy for you.  Eat nutritious foods and stay active.  Keep all of your prenatal visits as told by your health care provider. This information is not intended to replace advice given to you by your health care provider. Make sure you discuss any questions you have with your health care provider. Document Revised: 08/13/2019 Document Reviewed: 08/10/2017 Elsevier Patient Education  [employer] of Pregnancy  The first trimester of pregnancy is from week 1 until the end of week 13 (months 1 through 3). During this time, your baby will begin to develop inside you. At 6-8 weeks, the eyes and face are formed, and the heartbeat can be seen on ultrasound. At the end of 12 weeks,  all the baby's organs are formed. Prenatal care is all the medical care you receive before the birth of your baby. Make sure you get good prenatal care and follow all of your doctor's instructions. Follow these instructions at home: Medicines  Take over-the-counter and prescription medicines only as told by your doctor. Some medicines are safe and some medicines are not safe during pregnancy.  Take a prenatal vitamin that contains at least 600 micrograms (mcg) of folic acid.  If you have trouble pooping (constipation), take medicine that will make your stool soft (stool softener) if your doctor approves. Eating and drinking   Eat regular, healthy meals.  Your doctor will tell you the amount of weight gain that is right for you.  Avoid raw meat and uncooked cheese.  If you feel sick to your stomach (nauseous) or throw up (vomit): ? Eat 4 or 5 small meals a day instead of 3 large meals. ? Try eating a few soda crackers. ? Drink liquids between meals instead of during meals.  To prevent constipation: ? Eat foods that are high in fiber, like fresh fruits and vegetables, whole grains, and beans. ? Drink enough fluids to keep your pee (urine) clear or pale yellow. Activity  Exercise only as told by your doctor. Stop exercising if you have cramps or pain in your lower belly (abdomen) or low back.  Do not exercise if it is too hot, too humid, or if you are in a place of great height (high altitude).  Try to avoid standing for long periods of time. Move your legs often if you must stand in one place for a long time.  Avoid heavy lifting.  Wear low-heeled shoes. Sit and stand up straight.  You can have sex unless your doctor tells you not to. Relieving pain and discomfort  Wear a good support bra if your breasts are sore.  Take warm water baths (sitz baths) to soothe pain or discomfort caused by hemorrhoids. Use hemorrhoid cream if your doctor says it is okay.  Rest with your legs  raised if you have leg cramps or low back pain.  If you have puffy, bulging veins (varicose veins) in your legs: ? Wear support hose or compression stockings as told by your doctor. ? Raise (elevate) your feet for 15 minutes, 3-4 times a day. ? Limit salt in your food. Prenatal care  Schedule your prenatal visits by the twelfth week of pregnancy.  Write down your questions. Take them to your prenatal visits.  Keep all your prenatal visits as told by your doctor. This is important. Safety  Wear your seat belt at all times when driving.  Make a list of emergency phone numbers. The list should include numbers for family, friends, the hospital, and police and fire departments. General instructions  Ask your doctor for a referral to a local prenatal class. Begin classes no later than at the start of month 6 of your pregnancy.  Ask for help if you need counseling or if you need help with nutrition. Your doctor can give you advice or tell you where to go for help.  Do not use hot tubs, steam rooms, or saunas.  Do not douche or use tampons or scented sanitary pads.  Do not cross your legs for long periods of time.  Avoid all herbs and alcohol. Avoid drugs that are not approved by your doctor.  Do not use any tobacco products, including cigarettes, chewing tobacco, and electronic cigarettes. If you need help quitting, ask your doctor. You may get counseling or other support to help you quit.  Avoid cat litter boxes and soil used by cats. These carry germs that can cause birth defects in the baby and can cause a loss of your baby (miscarriage) or stillbirth.  Visit your dentist. At home, brush your teeth with a soft toothbrush. Be gentle when you floss. Contact a doctor if:  You are dizzy.  You have mild cramps or pressure in your lower belly.  You have a nagging pain in your belly area.  You continue to feel sick to your stomach, you throw up, or you have watery poop  (  diarrhea).  You have a bad smelling fluid coming from your vagina.  You have pain when you pee (urinate).  You have increased puffiness (swelling) in your face, hands, legs, or ankles. Get help right away if:  You have a fever.  You are leaking fluid from your vagina.  You have spotting or bleeding from your vagina.  You have very bad belly cramping or pain.  You gain or lose weight rapidly.  You throw up blood. It may look like coffee grounds.  You are around people who have [place] measles, fifth disease, or chickenpox.  You have a very bad headache.  You have shortness of breath.  You have any kind of trauma, such as from a fall or a car accident. Summary  The first trimester of pregnancy is from week 1 until the end of week 13 (months 1 through 3).  To take care of yourself and your unborn baby, you will need to eat healthy meals, take medicines only if your doctor tells you to do so, and do activities that are safe for you and your baby.  Keep all follow-up visits as told by your doctor. This is important as your doctor will have to ensure that your baby is healthy and growing well. This information is not intended to replace advice given to you by your health care provider. Make sure you discuss any questions you have with your health care provider. Document Revised: 03/13/2019 Document Reviewed: 11/28/2016 Elsevier Patient Education  2020 [employer]. Commonly Asked Questions During Pregnancy  Cats: A parasite can be excreted in cat feces.  To avoid exposure you need to have another person empty the little box.  If you must empty the litter box you will need to wear gloves.  Wash your hands after handling your cat.  This parasite can also be found in raw or undercooked meat so this should also be avoided.  Colds, Sore Throats, Flu: Please check your medication sheet to see what you can take for symptoms.  If your symptoms are unrelieved by these medications please  call the office.  Dental Work: Most any dental work Investment banker, corporate recommends is permitted.  X-rays should only be taken during the first trimester if absolutely necessary.  Your abdomen should be shielded with a lead apron during all x-rays.  Please notify your provider prior to receiving any x-rays.  Novocaine is fine; gas is not recommended.  If your dentist requires a note from [place] prior to dental work please call the office and we will provide one for you.  Exercise: Exercise is an important part of staying healthy during your pregnancy.  You may continue most exercises you were accustomed to prior to pregnancy.  Later in your pregnancy you will most likely notice you have difficulty with activities requiring balance like riding a bicycle.  It is important that you listen to your body and avoid activities that put you at a higher risk of falling.  Adequate rest and staying well hydrated are a must!  If you have questions about the safety of specific activities ask your provider.    Exposure to Children with illness: Try to avoid obvious exposure; report any symptoms to [place] when noted,  If you have chicken pos, red measles or mumps, you should be immune to these diseases.   Please do not take any vaccines while pregnant unless you have checked with your OB provider.  Fetal Movement: After 28 weeks we recommend you do "kick counts"  twice daily.  Lie or sit down in a calm quiet environment and count your baby movements "kicks".  You should feel your baby at least 10 times per hour.  If you have not felt 10 kicks within the first hour get up, walk around and have something sweet to eat or drink then repeat for an additional hour.  If count remains less than 10 per hour notify your provider.  Fumigating: Follow your pest control agent's advice as to how long to stay out of your home.  Ventilate the area well before re-entering.  Hemorrhoids:   Most over-the-counter preparations can be used during pregnancy.   Check your medication to see what is safe to use.  It is important to use a stool softener or fiber in your diet and to drink lots of liquids.  If hemorrhoids seem to be getting worse please call the office.   Hot Tubs:  Hot tubs Jacuzzis and saunas are not recommended while pregnant.  These increase your internal body temperature and should be avoided.  Intercourse:  Sexual intercourse is safe during pregnancy as long as you are comfortable, unless otherwise advised by your provider.  Spotting may occur after intercourse; report any bright red bleeding that is heavier than spotting.  Labor:  If you know that you are in labor, please go to the hospital.  If you are unsure, please call the office and let us help you decide what to do.  Lifting, straining, etc:  If your job requires heavy lifting or straining please check with your provider for any limitations.  Generally, you should not lift items heavier than that you can lift simply with your hands and arms (no back muscles)  Painting:  Paint fumes do not harm your pregnancy, but may make you ill and should be avoided if possible.  Latex or water based paints have less odor than oils.  Use adequate ventilation while painting.  Permanents & Hair Color:  Chemicals in hair dyes are not recommended as they cause increase hair dryness which can increase hair loss during pregnancy.  " Highlighting" and permanents are allowed.  Dye may be absorbed differently and permanents may not hold as well during pregnancy.  Sunbathing:  Use a sunscreen, as skin burns easily during pregnancy.  Drink plenty of fluids; avoid over heating.  Tanning Beds:  Because their possible side effects are still unknown, tanning beds are not recommended.  Ultrasound Scans:  Routine ultrasounds are performed at approximately 20 weeks.  You will be able to see your baby's general anatomy an if you would like to know the gender this can usually be determined as well.  If it is  questionable when you conceived you may also receive an ultrasound early in your pregnancy for dating purposes.  Otherwise ultrasound exams are not routinely performed unless there is a medical necessity.  Although you can request a scan we ask that you pay for it when conducted because insurance does not cover " patient request" scans.  Work: If your pregnancy proceeds without complications you may work until your due date, unless your physician or employer advises otherwise.  Round Ligament Pain/Pelvic Discomfort:  Sharp, shooting pains not associated with bleeding are fairly common, usually occurring in the second trimester of pregnancy.  They tend to be worse when standing up or when you remain standing for long periods of time.  These are the result of pressure of certain pelvic ligaments called "round ligaments".  Rest, Tylenol and heat  seem to be the most effective relief.  As the womb and fetus grow, they rise out of the pelvis and the discomfort improves.  Please notify the office if your pain seems different than that described.  It may represent a more serious condition.  Common Medications Safe in Pregnancy  Acne:      Constipation:  Benzoyl Peroxide     Colace  Clindamycin      Dulcolax Suppository  Topica Erythromycin     Fibercon  Salicylic Acid      Metamucil         Miralax AVOID:        Senakot   Accutane    Cough:  Retin-A       Cough Drops  Tetracycline      Phenergan w/ Codeine if Rx  Minocycline      Robitussin (Plain & DM)  Antibiotics:     Crabs/Lice:  Ceclor       RID  Cephalosporins    AVOID:  E-Mycins      Kwell  Keflex  Macrobid/Macrodantin   Diarrhea:  Penicillin      Kao-Pectate  Zithromax      Imodium AD         PUSH FLUIDS AVOID:       Cipro     Fever:  Tetracycline      Tylenol (Regular or Extra  Minocycline       Strength)  Levaquin      Extra Strength-Do not          Exceed 8 tabs/24 hrs Caffeine:        '200mg'$ /day (equiv. To 1 cup of coffee  or  approx. 3 12 oz sodas)         Gas: Cold/Hayfever:       Gas-X  Benadryl      Mylicon  Claritin       Phazyme  **Claritin-D        Chlor-Trimeton    Headaches:  Dimetapp      ASA-Free Excedrin  Drixoral-Non-Drowsy     Cold Compress  Mucinex (Guaifenasin)     Tylenol (Regular or Extra  Sudafed/Sudafed-12 Hour     Strength)  **Sudafed PE Pseudoephedrine   Tylenol Cold & Sinus     Vicks Vapor Rub  Zyrtec  **AVOID if Problems With Blood Pressure         Heartburn: Avoid lying down for at least 1 hour after meals  Aciphex      Maalox     Rash:  Milk of Magnesia     Benadryl    Mylanta       1% Hydrocortisone Cream  Pepcid  Pepcid Complete   Sleep Aids:  Prevacid      Ambien   Prilosec       Benadryl  Rolaids       Chamomile Tea  Tums (Limit 4/day)     Unisom  Zantac       Tylenol PM         Warm milk-add vanilla or  Hemorrhoids:       Sugar for taste  Anusol/Anusol H.C.  (RX: Analapram 2.5%)  Sugar Substitutes:  Hydrocortisone OTC     Ok in moderation  Preparation H      Tucks        Vaseline lotion applied to tissue with wiping    Herpes:     Throat:  Acyclovir      Oragel  Famvir  Valtrex     Vaccines:         Flu Shot Leg Cramps:       *Gardasil  Benadryl      Hepatitis A         Hepatitis B Nasal Spray:       Pneumovax  Saline Nasal Spray     Polio Booster         Tetanus Nausea:       Tuberculosis test or PPD  Vitamin B6 25 mg TID   AVOID:    Dramamine      *Gardasil  Emetrol       Live Poliovirus  Ginger Root 250 mg QID    MMR (measles, mumps &  High Complex Carbs @ Bedtime    rebella)  Sea Bands-Accupressure    Varicella (Chickenpox)  Unisom 1/2 tab TID     *No known complications           If received before Pain:         Known pregnancy;   Darvocet       Resume series after  Lortab        Delivery  Percocet    Yeast:   Tramadol      Femstat  Tylenol 3      Gyne-lotrimin  Ultram       Monistat  Vicodin           MISC:         All  Sunscreens           Hair Coloring/highlights          Insect Repellant's          (Including DEET)         Mystic Tans

## 2020-05-07 NOTE — Progress Notes (Signed)
Danielle Gillespie presents for NOB nurse intake visit. Pregnancy confirmation done at [facility], 04/20/2020, with Linzie Collin.  G 1.  P 0.  LMP 03/01/2020.  EDD 12/12/2020.  Ga [redacted]w[redacted]d. Pregnancy education material explained and given.   0 cats in the home.  NOB labs ordered. BMI less than 30. TSH/HbgA1c not ordered. Sickle cell not order due to race. HIV and drug screen explained and ordered. Genetic screening discussed. Genetic testing; Unsure. Pt to discuss genetic testing with provider. PNV encouraged. Pt to follow up with provider in 4 weeks for NOB physical.  FMLA and [facility] Financial Policy reviewed and signed.    Pt requested to see Midwives.

## 2020-05-08 LAB — URINALYSIS, ROUTINE W REFLEX MICROSCOPIC
Bilirubin, UA: NEGATIVE
Glucose, UA: NEGATIVE
Leukocytes,UA: NEGATIVE
Nitrite, UA: NEGATIVE
RBC, UA: NEGATIVE
Specific Gravity, UA: 1.028 (ref 1.005–1.030)
Urobilinogen, Ur: 0.2 mg/dL (ref 0.2–1.0)
pH, UA: 8 — ABNORMAL HIGH (ref 5.0–7.5)

## 2020-05-09 LAB — GC/CHLAMYDIA PROBE AMP
Chlamydia trachomatis, NAA: NEGATIVE
Neisseria Gonorrhoeae by PCR: NEGATIVE

## 2020-05-09 LAB — URINE CULTURE, OB REFLEX: Organism ID, Bacteria: NO GROWTH

## 2020-05-09 LAB — CULTURE, OB URINE

## 2020-05-10 ENCOUNTER — Encounter: Payer: Self-pay | Admitting: Certified Nurse Midwife

## 2020-05-10 DIAGNOSIS — Z2839 Other underimmunization status: Secondary | ICD-10-CM | POA: Insufficient documentation

## 2020-05-10 LAB — VARICELLA ZOSTER ANTIBODY, IGG: Varicella zoster IgG: 163 index — ABNORMAL LOW (ref >165)

## 2020-05-10 LAB — ABO AND RH: Rh Factor: POSITIVE

## 2020-05-10 LAB — RPR: RPR Ser Ql: NONREACTIVE

## 2020-05-10 LAB — TOXOPLASMA ANTIBODIES- IGG AND  IGM
Toxoplasma Antibody- IgM: <3 AU/mL (ref 0.0–7.9)
Toxoplasma IgG Ratio: <3 IU/mL (ref 0.0–7.1)

## 2020-05-10 LAB — ANTIBODY SCREEN: Antibody Screen: NEGATIVE

## 2020-05-10 LAB — RUBELLA SCREEN: Rubella Antibodies, IGG: <0.9 index — ABNORMAL LOW (ref >0.99)

## 2020-05-10 LAB — HIV ANTIBODY (ROUTINE TESTING W REFLEX): HIV Screen 4th Generation wRfx: NONREACTIVE

## 2020-05-10 LAB — HEPATITIS B SURFACE ANTIGEN: Hepatitis B Surface Ag: NEGATIVE

## 2020-05-10 NOTE — Progress Notes (Signed)
I have reviewed the record and concur with patient management and plan of care.    Gunnar Bulla, CNM [facility], [facility] 05/10/20 9:10 AM

## 2020-05-11 LAB — DRUG PROFILE, UR, 9 DRUGS (LABCORP)
Amphetamines, Urine: NEGATIVE ng/mL
Barbiturate Quant, Ur: NEGATIVE ng/mL
Benzodiazepine Quant, Ur: NEGATIVE ng/mL
Cannabinoid Quant, Ur: POSITIVE — AB
Cocaine (Metab.): NEGATIVE ng/mL
Methadone Screen, Urine: NEGATIVE ng/mL
Opiate Quant, Ur: NEGATIVE ng/mL
PCP Quant, Ur: NEGATIVE ng/mL
Propoxyphene: NEGATIVE ng/mL

## 2020-05-11 LAB — NICOTINE SCREEN, URINE: Cotinine Ql Scrn, Ur: POSITIVE ng/mL — AB

## 2020-05-13 ENCOUNTER — Encounter: Payer: Self-pay | Admitting: Certified Nurse Midwife

## 2020-05-13 DIAGNOSIS — F1290 Cannabis use, unspecified, uncomplicated: Secondary | ICD-10-CM | POA: Insufficient documentation

## 2020-05-25 ENCOUNTER — Encounter: Payer: Self-pay | Admitting: Obstetrics and Gynecology

## 2020-05-28 ENCOUNTER — Ambulatory Visit (INDEPENDENT_AMBULATORY_CARE_PROVIDER_SITE_OTHER): Payer: Medicaid Other | Admitting: Certified Nurse Midwife

## 2020-05-28 VITALS — BP 88/52 | HR 79 | Wt 172.2 lb

## 2020-05-28 DIAGNOSIS — Z3401 Encounter for supervision of normal first pregnancy, first trimester: Secondary | ICD-10-CM | POA: Diagnosis not present

## 2020-05-28 DIAGNOSIS — Z283 Underimmunization status: Secondary | ICD-10-CM

## 2020-05-28 DIAGNOSIS — Z1379 Encounter for other screening for genetic and chromosomal anomalies: Secondary | ICD-10-CM

## 2020-05-28 DIAGNOSIS — Z2839 Other underimmunization status: Secondary | ICD-10-CM

## 2020-05-28 DIAGNOSIS — O99891 Other specified diseases and conditions complicating pregnancy: Secondary | ICD-10-CM

## 2020-05-28 DIAGNOSIS — Z3A11 11 weeks gestation of pregnancy: Secondary | ICD-10-CM | POA: Diagnosis not present

## 2020-05-28 DIAGNOSIS — O09899 Supervision of other high risk pregnancies, unspecified trimester: Secondary | ICD-10-CM

## 2020-05-28 DIAGNOSIS — F1290 Cannabis use, unspecified, uncomplicated: Secondary | ICD-10-CM

## 2020-05-28 LAB — POCT URINALYSIS DIPSTICK OB
Bilirubin, UA: NEGATIVE
Blood, UA: NEGATIVE
Glucose, UA: NEGATIVE
Ketones, UA: NEGATIVE
Leukocytes, UA: NEGATIVE
Nitrite, UA: NEGATIVE
POC,PROTEIN,UA: NEGATIVE
Spec Grav, UA: 1.01 (ref 1.010–1.025)
Urobilinogen, UA: 0.2 E.U./dL
pH, UA: 5 (ref 5.0–8.0)

## 2020-05-28 NOTE — Patient Instructions (Signed)
WHAT OB PATIENTS CAN EXPECT   Confirmation of pregnancy and ultrasound ordered if medically indicated-[redacted] weeks gestation  New OB (NOB) intake with nurse and New OB (NOB) labs- [redacted] weeks gestation  New OB (NOB) physical examination with provider- 11/[redacted] weeks gestation  Flu vaccine-[redacted] weeks gestation  Anatomy scan-[redacted] weeks gestation  Glucose tolerance test, blood work to test for anemia, T-dap vaccine-[redacted] weeks gestation  Vaginal swabs/cultures-STD/Group B strep-[redacted] weeks gestation  Appointments every 4 weeks until 28 weeks  Every 2 weeks from 28 weeks until 36 weeks  Weekly visits from 36 weeks until delivery    Exercise During Pregnancy Exercise is an important part of being healthy for people of all ages. Exercise improves the function of your heart and lungs and helps you maintain strength, flexibility, and a healthy body weight. Exercise also boosts energy levels and elevates mood. Most women should exercise regularly during pregnancy. In rare cases, women with certain medical conditions or complications may be asked to limit or avoid exercise during pregnancy. How does this affect me? Along with maintaining general strength and flexibility, exercising during pregnancy can help:  Keep strength in muscles that are used during labor and childbirth.  Decrease low back pain.  Reduce symptoms of depression.  Control weight gain during pregnancy.  Reduce the risk of needing insulin if you develop diabetes during pregnancy.  Decrease the risk of cesarean delivery.  Speed up your recovery after giving birth. How does this affect my baby? Exercise can help you have a healthy pregnancy. Exercise does not cause premature birth. It will not cause your baby to weigh less at birth. What exercises can I do? Many exercises are safe for you to do during pregnancy. Do a variety of exercises that safely increase your heart and breathing rates and help you build and maintain muscle strength.  Do exercises exactly as told by your health care provider. You may do these exercises:  Walking or hiking.  Swimming.  Water aerobics.  Riding a stationary bike.  Strength training.  Modified yoga or Pilates. Tell your instructor that you are pregnant. Avoid overstretching, and avoid lying on your back for long periods of time.  Running or jogging. Only choose this type of exercise if you: ? Ran or jogged regularly before your pregnancy. ? Can run or jog and still talk in complete sentences. What exercises should I avoid? Depending on your level of fitness and whether you exercised regularly before your pregnancy, you may be told to limit high-intensity exercise. You can tell that you are exercising at a high intensity if you are breathing much harder and faster and cannot hold a conversation while exercising. You must avoid:  Contact sports.  Activities that put you at risk for falling on or being hit in the belly, such as downhill skiing, water skiing, surfing, rock climbing, cycling, gymnastics, and horseback riding.  Scuba diving.  Skydiving.  Yoga or Pilates in a room that is heated to high temperatures.  Jogging or running, unless you ran or jogged regularly before your pregnancy. While jogging or running, you should always be able to talk in full sentences. Do not run or jog so fast that you are unable to have a conversation.  Do not exercise at more than 6,000 feet above sea level (high elevation) if you are not used to exercising at high elevation. How do I exercise in a safe way?   Avoid overheating. Do not exercise in very high temperatures.  Wear loose-fitting, breathable  clothes.  Avoid dehydration. Drink enough water before, during, and after exercise to keep your urine pale yellow.  Avoid overstretching. Because of hormone changes during pregnancy, it is easy to overstretch muscles, tendons, and ligaments during pregnancy.  Start slowly and ask your health  care provider to recommend the types of exercise that are safe for you.  Do not exercise to lose weight. Follow these instructions at home:  Exercise on most days or all days of the week. Try to exercise for 30 minutes a day, 5 days a week, unless your health care provider tells you not to.  If you actively exercised before your pregnancy and you are healthy, your health care provider may tell you to continue to do moderate to high-intensity exercise.  If you are just starting to exercise or did not exercise much before your pregnancy, your health care provider may tell you to do low to moderate-intensity exercise. Questions to ask your health care provider  Is exercise safe for me?  What are signs that I should stop exercising?  Does my health condition mean that I should not exercise during pregnancy?  When should I avoid exercising during pregnancy? Stop exercising and contact a health care provider if: You have any unusual symptoms, such as:  Mild contractions of the uterus or cramps in the abdomen.  Dizziness that does not go away when you rest. Stop exercising and get help right away if: You have any unusual symptoms, such as:  Sudden, severe pain in your low back or your belly.  Mild contractions of the uterus or cramps in the abdomen that do not improve with rest and drinking fluids.  Chest pain.  Bleeding or fluid leaking from your vagina.  Shortness of breath. These symptoms may represent a serious problem that is an emergency. Do not wait to see if the symptoms will go away. Get medical help right away. Call your local emergency services (911 in the [place] Do not drive yourself to the hospital. Summary  Most women should exercise regularly throughout pregnancy. In rare cases, women with certain medical conditions or complications may be asked to limit or avoid exercise during pregnancy.  Do not exercise to lose weight during pregnancy.  Your health care provider  will tell you what level of physical activity is right for you.  Stop exercising and contact a health care provider if you have mild contractions of the uterus or cramps in the abdomen. Get help right away if these contractions or cramps do not improve with rest and drinking fluids.  Stop exercising and get help right away if you have sudden, severe pain in your low back or belly, chest pain, shortness of breath, or bleeding or leaking of fluid from your vagina. This information is not intended to replace advice given to you by your health care provider. Make sure you discuss any questions you have with your health care provider. Document Revised: 03/13/2019 Document Reviewed: 12/25/2018 Elsevier Patient Education  [place] Weight Gain During Pregnancy, Adult A certain amount of weight gain during pregnancy is normal and healthy. How much weight you should gain depends on your overall health and a measurement called BMI (body mass index). BMI is an estimate of your body fat based on your height and weight. You can use an Freight forwarder to figure out your BMI, or you can ask your health care provider to calculate it for you at your next visit. Your recommended pregnancy weight gain  is based on your pre-pregnancy BMI. General guidelines for a healthy total weight gain during pregnancy are listed below. If your BMI at or before the start of your pregnancy is:  Less than 18.5 (underweight), you should gain 28-40 lb (13-18 kg).  18.5-24.9 (normal weight), you should gain 25-35 lb (11-16 kg).  25-29.9 (overweight), you should gain 15-25 lb (7-11 kg).  30 or higher (obese), you should gain 11-20 lb (5-9 kg). These ranges vary depending on your individual health. If you are carrying more than one baby (multiples), it may be safe to gain more weight than these recommendations. If you gain less weight than recommended, that may be safe as long as your baby is growing and developing  normally. How can unhealthy weight gain affect me and my baby? Gaining too much weight during pregnancy can lead to pregnancy complications, such as:  A temporary form of diabetes that develops during pregnancy (gestational diabetes).  High blood pressure during pregnancy and protein in your urine (preeclampsia).  High blood pressure during pregnancy without protein in your urine (gestational hypertension).  Your baby having a high weight at birth, which may: ? Raise your risk of having a more difficult delivery or a surgical delivery (cesarean delivery, or C-section). ? Raise your child's risk of developing obesity during childhood. Not gaining enough weight can be life-threatening for your baby, and it may raise your baby's chances of:  Being born early (preterm).  Growing more slowly than normal during pregnancy (growth restriction).  Having a low weight at birth. What actions can I take to gain a healthy amount of weight during pregnancy? General instructions  Keep track of your weight gain during pregnancy.  Take over-the-counter and prescription medicines only as told by your health care provider. Take all prenatal supplements as directed.  Keep all health care visits during pregnancy (prenatal visits). These visits are a good time to discuss your weight gain. Your health care provider will weigh you at each visit to make sure you are gaining a healthy amount of weight. Nutrition   Eat a balanced, nutrient-rich diet. Eat plenty of: ? Fruits and vegetables, such as berries and broccoli. ? Whole grains, such as millet, barley, whole-wheat breads and cereals, and oatmeal. ? Low-fat dairy products or non-dairy products such as almond milk or rice milk. ? Protein foods, such as lean meat, chicken, eggs, and legumes (such as peas, beans, soybeans, and lentils).  Avoid foods that are fried or have a lot of fat, salt (sodium), or sugar.  Drink enough fluid to keep your urine pale  yellow.  Choose healthy snack and drink options when you are at work or on the go: ? Drink water. Avoid soda, sports drinks, and juices that have added sugar. ? Avoid drinks with caffeine, such as coffee and energy drinks. ? Eat snacks that are high in protein, such as nuts, protein bars, and low-fat yogurt. ? Carry convenient snacks in your purse that do not need refrigeration, such as a pack of trail mix, an apple, or a granola bar.  If you need help improving your diet, work with a health care provider or a diet and nutrition specialist (dietitian). Activity   Exercise regularly, as told by your health care provider. ? If you were active before becoming pregnant, you may be able to continue your regular fitness activities. ? If you were not active before pregnancy, you may gradually build up to exercising for 30 or more minutes on most days of  the week. This may include walking, swimming, or yoga.  Ask your health care provider what activities are safe for you. Talk with your health care provider about whether you may need to be excused from certain school or work activities. Where to find more information Learn more about managing your weight gain during pregnancy from:  American Pregnancy Association: www.americanpregnancy.org  [place] Department of Agriculture pregnancy weight gain calculator: [employer] Summary  Too much weight gain during pregnancy can lead to complications for you and your baby.  Find out your pre-pregnancy BMI to determine how much weight gain is healthy for you.  Eat nutritious foods and stay active.  Keep all of your prenatal visits as told by your health care provider. This information is not intended to replace advice given to you by your health care provider. Make sure you discuss any questions you have with your health care provider. Document Revised: 08/13/2019 Document Reviewed: 08/10/2017 Elsevier Patient Education  [place].   Common Medications Safe in Pregnancy  Acne:      Constipation:  Benzoyl Peroxide     Colace  Clindamycin      Dulcolax Suppository  Topica Erythromycin     Fibercon  Salicylic Acid      Metamucil         Miralax AVOID:        Senakot   Accutane    Cough:  Retin-A       Cough Drops  Tetracycline      Phenergan w/ Codeine if Rx  Minocycline      Robitussin (Plain & DM)  Antibiotics:     Crabs/Lice:  Ceclor       RID  Cephalosporins    AVOID:  E-Mycins      Kwell  Keflex  Macrobid/Macrodantin   Diarrhea:  Penicillin      Kao-Pectate  Zithromax      Imodium AD         PUSH FLUIDS AVOID:       Cipro     Fever:  Tetracycline      Tylenol (Regular or Extra  Minocycline       Strength)  Levaquin      Extra Strength-Do not          Exceed 8 tabs/24 hrs Caffeine:        <221m/day (equiv. To 1 cup of coffee or  approx. 3 12 oz sodas)         Gas: Cold/Hayfever:       Gas-X  Benadryl      Mylicon  Claritin       Phazyme  **Claritin-D        Chlor-Trimeton    Headaches:  Dimetapp      ASA-Free Excedrin  Drixoral-Non-Drowsy     Cold Compress  Mucinex (Guaifenasin)     Tylenol (Regular or Extra  Sudafed/Sudafed-12 Hour     Strength)  **Sudafed PE Pseudoephedrine   Tylenol Cold & Sinus     Vicks Vapor Rub  Zyrtec  **AVOID if Problems With Blood Pressure         Heartburn: Avoid lying down for at least 1 hour after meals  Aciphex      Maalox     Rash:  Milk of Magnesia     Benadryl    Mylanta       1% Hydrocortisone Cream  Pepcid  Pepcid Complete   Sleep Aids:  Prevacid      Ambien  Prilosec       Benadryl  Rolaids       Chamomile Tea  Tums (Limit 4/day)     Unisom  Zantac       Tylenol PM         Warm milk-add vanilla or  Hemorrhoids:       Sugar for taste  Anusol/Anusol H.C.  (RX: Analapram 2.5%)  Sugar Substitutes:  Hydrocortisone OTC     Ok in moderation  Preparation H      Tucks        Vaseline lotion applied to tissue with  wiping    Herpes:     Throat:  Acyclovir      Oragel  Famvir  Valtrex     Vaccines:         Flu Shot Leg Cramps:       *Gardasil  Benadryl      Hepatitis A         Hepatitis B Nasal Spray:       Pneumovax  Saline Nasal Spray     Polio Booster         Tetanus Nausea:       Tuberculosis test or PPD  Vitamin B6 25 mg TID   AVOID:    Dramamine      *Gardasil  Emetrol       Live Poliovirus  Ginger Root 250 mg QID    MMR (measles, mumps &  High Complex Carbs @ Bedtime    rebella)  Sea Bands-Accupressure    Varicella (Chickenpox)  Unisom 1/2 tab TID     *No known complications           If received before Pain:         Known pregnancy;   Darvocet       Resume series after  Lortab        Delivery  Percocet    Yeast:   Tramadol      Femstat  Tylenol 3      Gyne-lotrimin  Ultram       Monistat  Vicodin           MISC:         All Sunscreens           Hair Coloring/highlights          Insect Repellant's          (Including DEET)         Mystic Tans   Second Trimester of Pregnancy  The second trimester is from week 14 through week 27 (month 4 through 6). This is often the time in pregnancy that you feel your best. Often times, morning sickness has lessened or quit. You may have more energy, and you may get hungry more often. Your unborn baby is growing rapidly. At the end of the sixth month, he or she is about 9 inches long and weighs about 1 pounds. You will likely feel the baby move between 18 and 20 weeks of pregnancy. Follow these instructions at home: Medicines  Take over-the-counter and prescription medicines only as told by your doctor. Some medicines are safe and some medicines are not safe during pregnancy.  Take a prenatal vitamin that contains at least 600 micrograms (mcg) of folic acid.  If you have trouble pooping (constipation), take medicine that will make your stool soft (stool softener) if your doctor approves. Eating and drinking   Eat regular, healthy  meals.  Avoid raw meat and uncooked  cheese.  If you get low calcium from the food you eat, talk to your doctor about taking a daily calcium supplement.  Avoid foods that are high in fat and sugars, such as fried and sweet foods.  If you feel sick to your stomach (nauseous) or throw up (vomit): ? Eat 4 or 5 small meals a day instead of 3 large meals. ? Try eating a few soda crackers. ? Drink liquids between meals instead of during meals.  To prevent constipation: ? Eat foods that are high in fiber, like fresh fruits and vegetables, whole grains, and beans. ? Drink enough fluids to keep your pee (urine) clear or pale yellow. Activity  Exercise only as told by your doctor. Stop exercising if you start to have cramps.  Do not exercise if it is too hot, too humid, or if you are in a place of great height (high altitude).  Avoid heavy lifting.  Wear low-heeled shoes. Sit and stand up straight.  You can continue to have sex unless your doctor tells you not to. Relieving pain and discomfort  Wear a good support bra if your breasts are tender.  Take warm water baths (sitz baths) to soothe pain or discomfort caused by hemorrhoids. Use hemorrhoid cream if your doctor approves.  Rest with your legs raised if you have leg cramps or low back pain.  If you develop puffy, bulging veins (varicose veins) in your legs: ? Wear support hose or compression stockings as told by your doctor. ? Raise (elevate) your feet for 15 minutes, 3-4 times a day. ? Limit salt in your food. Prenatal care  Write down your questions. Take them to your prenatal visits.  Keep all your prenatal visits as told by your doctor. This is important. Safety  Wear your seat belt when driving.  Make a list of emergency phone numbers, including numbers for family, friends, the hospital, and police and fire departments. General instructions  Ask your doctor about the right foods to eat or for help finding a counselor,  if you need these services.  Ask your doctor about local prenatal classes. Begin classes before month 6 of your pregnancy.  Do not use hot tubs, steam rooms, or saunas.  Do not douche or use tampons or scented sanitary pads.  Do not cross your legs for long periods of time.  Visit your dentist if you have not done so. Use a soft toothbrush to brush your teeth. Floss gently.  Avoid all smoking, herbs, and alcohol. Avoid drugs that are not approved by your doctor.  Do not use any products that contain nicotine or tobacco, such as cigarettes and e-cigarettes. If you need help quitting, ask your doctor.  Avoid cat litter boxes and soil used by cats. These carry germs that can cause birth defects in the baby and can cause a loss of your baby (miscarriage) or stillbirth. Contact a doctor if:  You have mild cramps or pressure in your lower belly.  You have pain when you pee (urinate).  You have bad smelling fluid coming from your vagina.  You continue to feel sick to your stomach (nauseous), throw up (vomit), or have watery poop (diarrhea).  You have a nagging pain in your belly area.  You feel dizzy. Get help right away if:  You have a fever.  You are leaking fluid from your vagina.  You have spotting or bleeding from your vagina.  You have severe belly cramping or pain.  You lose or gain  weight rapidly.  You have trouble catching your breath and have chest pain.  You notice sudden or extreme puffiness (swelling) of your face, hands, ankles, feet, or legs.  You have not felt the baby move in over an hour.  You have severe headaches that do not go away when you take medicine.  You have trouble seeing. Summary  The second trimester is from week 14 through week 27 (months 4 through 6). This is often the time in pregnancy that you feel your best.  To take care of yourself and your unborn baby, you will need to eat healthy meals, take medicines only if your doctor tells  you to do so, and do activities that are safe for you and your baby.  Call your doctor if you get sick or if you notice anything unusual about your pregnancy. Also, call your doctor if you need help with the right food to eat, or if you want to know what activities are safe for you. This information is not intended to replace advice given to you by your health care provider. Make sure you discuss any questions you have with your health care provider. Document Revised: 03/14/2019 Document Reviewed: 12/26/2016 Elsevier Patient Education  [place].

## 2020-05-28 NOTE — Progress Notes (Signed)
NEW OB HISTORY AND PHYSICAL  SUBJECTIVE:       Danielle Gillespie is a 29 y.o. G1P0 female, Patient's last menstrual period was 03/01/2020., Estimated Date of Delivery: 12/12/20, [redacted]w[redacted]d, presents today for establishment of Prenatal Care.  Endorses breast tenderness, nausea without vomiting and occasional cramping.   Denies difficulty breathing or respiratory distress, chest pain, abdominal pain, vaginal bleeding, dysuria, and leg pain or swelling.   Desires genetic screening. Prefers midwifery care.    Gynecologic History  Patient's last menstrual period was 03/01/2020.   Contraception: none   Last Pap: 2020. Results were: normal per patient ([facility])  Obstetric History  OB History  Gravida Para Term Preterm AB Living  1            SAB TAB Ectopic Multiple Live Births               # Outcome Date GA Lbr Len/2nd Weight Sex Delivery Anes PTL Lv  1 Current             Current Outpatient Medications on File Prior to Visit  Medication Sig Dispense Refill  . fluticasone (FLONASE) 50 MCG/ACT nasal spray Place 2 sprays into both nostrils daily. 16 g 6  . Prenatal Vit-Fe Fumarate-FA (PRENATAL MULTIVITAMIN) TABS tablet Take 1 tablet by mouth daily at 12 noon.     No current facility-administered medications on file prior to visit.    No Known Allergies  Social History   Socioeconomic History  . Marital status: Single    Spouse name: Not on file  . Number of children: Not on file  . Years of education: Not on file  . Highest education level: Not on file  Occupational History  . Not on file  Tobacco Use  . Smoking status: Former Smoker    Packs/day: 1.00    Types: Cigarettes    Quit date: 04/05/2020    Years since quitting: 0.1  . Smokeless tobacco: Never Used  Substance and Sexual Activity  . Alcohol use: No  . Drug use: Not Currently  . Sexual activity: Yes    Partners: Male  Other Topics Concern  . Not on file  Social History Narrative  . Not on file   Social  Determinants of Health   Financial Resource Strain:   . Difficulty of Paying Living Expenses:   Food Insecurity:   . Worried About Programme researcher, broadcasting/film/video in the Last Year:   . Barista in the Last Year:   Transportation Needs:   . Freight forwarder (Medical):   Marland Kitchen Lack of Transportation (Non-Medical):   Physical Activity:   . Days of Exercise per Week:   . Minutes of Exercise per Session:   Stress:   . Feeling of Stress :   Social Connections:   . Frequency of Communication with Friends and Family:   . Frequency of Social Gatherings with Friends and Family:   . Attends Religious Services:   . Active Member of Clubs or Organizations:   . Attends Banker Meetings:   Marland Kitchen Marital Status:   Intimate Partner Violence:   . Fear of Current or Ex-Partner:   . Emotionally Abused:   Marland Kitchen Physically Abused:   . Sexually Abused:     The following portions of the patient's history were reviewed and updated as appropriate: allergies, current medications, past OB history, past medical history, past surgical history, past family history, past social history, and problem list.  Review of Systems;  ROS negative except as noted above. Information obtained from patient.   OBJECTIVE:  BP (!) 88/52   Pulse 79   Wt 172 lb 4 oz (78.1 kg)   LMP 03/01/2020   BMI 30.51 kg/m   Initial Physical Exam (New OB)  GENERAL APPEARANCE: alert, well appearing, in no apparent distress  HEAD: normocephalic, atraumatic  MOUTH: mucous membranes moist, pharynx normal without lesions  THYROID: no thyromegaly or masses present  BREASTS: no masses noted, no significant tenderness, no palpable axillary nodes, no skin changes  LUNGS: clear to auscultation, no wheezes, rales or rhonchi, symmetric air entry  HEART: regular rate and rhythm, no murmurs  ABDOMEN: soft, nontender, nondistended, no abnormal masses, no epigastric pain, obese and FHT present  EXTREMITIES: no redness or tenderness  in the calves or thighs, no edema  SKIN: normal coloration and turgor, no rashes  LYMPH NODES: no adenopathy palpable  NEUROLOGIC: alert, oriented, normal speech, no focal findings or movement disorder noted  PELVIC EXAM EXTERNAL GENITALIA: normal appearing vulva with no masses, tenderness or lesions  ASSESSMENT: Normal pregnancy Rh positive Varicella non-immune Rubella non-immune History of marijuana use-PMH completed Desires genetic screening-Panorama collected today Prefers midwifery care  PLAN: Prenatal care New OB counseling: The patient has been given an overview regarding routine prenatal care. Recommendations regarding diet, weight gain, and exercise in pregnancy were given. Prenatal testing, optional genetic testing, and ultrasound use in pregnancy were reviewed.  Benefits of Breast Feeding were discussed. The patient is encouraged to consider nursing her baby post partum. See orders

## 2020-05-31 DIAGNOSIS — Z3A11 11 weeks gestation of pregnancy: Secondary | ICD-10-CM | POA: Diagnosis not present

## 2020-06-01 LAB — CBC
Hematocrit: 38.2 % (ref 34.0–46.6)
Hemoglobin: 12.9 g/dL (ref 11.1–15.9)
MCH: 32.5 pg (ref 26.6–33.0)
MCHC: 33.8 g/dL (ref 31.5–35.7)
MCV: 96 fL (ref 79–97)
Platelets: 364 10*3/uL (ref 150–450)
RBC: 3.97 x10E6/uL (ref 3.77–5.28)
RDW: 12.4 % (ref 11.7–15.4)
WBC: 16.6 10*3/uL — ABNORMAL HIGH (ref 3.4–10.8)

## 2020-06-03 DIAGNOSIS — Z419 Encounter for procedure for purposes other than remedying health state, unspecified: Secondary | ICD-10-CM | POA: Diagnosis not present

## 2020-06-22 ENCOUNTER — Encounter: Payer: Self-pay | Admitting: Certified Nurse Midwife

## 2020-06-22 ENCOUNTER — Ambulatory Visit (INDEPENDENT_AMBULATORY_CARE_PROVIDER_SITE_OTHER): Payer: Medicaid Other | Admitting: Certified Nurse Midwife

## 2020-06-22 VITALS — BP 99/43 | HR 90 | Wt 178.6 lb

## 2020-06-22 DIAGNOSIS — Z3402 Encounter for supervision of normal first pregnancy, second trimester: Secondary | ICD-10-CM

## 2020-06-22 NOTE — Progress Notes (Signed)
ROB doing well. Has some mild nuasea but is dealing oik. Denies vomiting. Discussed anatomy u/s next visit. She verbalizes and agrees. Follow up 4.5 wks with Marcelino Duster.   Doreene Burke, CNM

## 2020-06-22 NOTE — Patient Instructions (Signed)

## 2020-06-23 ENCOUNTER — Encounter: Payer: Medicaid Other | Admitting: Certified Nurse Midwife

## 2020-07-04 DIAGNOSIS — Z419 Encounter for procedure for purposes other than remedying health state, unspecified: Secondary | ICD-10-CM | POA: Diagnosis not present

## 2020-07-16 ENCOUNTER — Ambulatory Visit (INDEPENDENT_AMBULATORY_CARE_PROVIDER_SITE_OTHER): Payer: Medicaid Other | Admitting: Certified Nurse Midwife

## 2020-07-16 DIAGNOSIS — R300 Dysuria: Secondary | ICD-10-CM | POA: Diagnosis not present

## 2020-07-16 DIAGNOSIS — O26892 Other specified pregnancy related conditions, second trimester: Secondary | ICD-10-CM | POA: Diagnosis not present

## 2020-07-16 LAB — POCT URINALYSIS DIPSTICK
Bilirubin, UA: NEGATIVE
Blood, UA: NEGATIVE
Glucose, UA: NEGATIVE
Ketones, UA: NEGATIVE
Leukocytes, UA: NEGATIVE
Nitrite, UA: NEGATIVE
Protein, UA: NEGATIVE
Spec Grav, UA: 1.025 (ref 1.010–1.025)
Urobilinogen, UA: 0.2 E.U./dL
pH, UA: 5 (ref 5.0–8.0)

## 2020-07-16 MED ORDER — NITROFURANTOIN MONOHYD MACRO 100 MG PO CAPS: 100.0000 mg | ORAL_CAPSULE | Freq: Two times a day (BID) | ORAL | 1 refills | Status: DC

## 2020-07-16 NOTE — Progress Notes (Signed)
Patient called the office to report symptoms of UTI- urgency, lower right side abdominal pain radiating around to right side back. Patient was advised to come to the office for a urine drop off. U/A and urine culture done. Macrobid sent to preferred pharmacy patient informed.

## 2020-07-18 LAB — URINE CULTURE: Organism ID, Bacteria: NO GROWTH

## 2020-07-29 ENCOUNTER — Encounter: Payer: Self-pay | Admitting: Certified Nurse Midwife

## 2020-07-29 ENCOUNTER — Ambulatory Visit (INDEPENDENT_AMBULATORY_CARE_PROVIDER_SITE_OTHER): Payer: Medicaid Other

## 2020-07-29 ENCOUNTER — Other Ambulatory Visit: Payer: Self-pay

## 2020-07-29 ENCOUNTER — Ambulatory Visit (INDEPENDENT_AMBULATORY_CARE_PROVIDER_SITE_OTHER): Payer: Medicaid Other | Admitting: Certified Nurse Midwife

## 2020-07-29 VITALS — BP 104/57 | HR 88 | Wt 184.3 lb

## 2020-07-29 DIAGNOSIS — Z3402 Encounter for supervision of normal first pregnancy, second trimester: Secondary | ICD-10-CM

## 2020-07-29 DIAGNOSIS — Z3A20 20 weeks gestation of pregnancy: Secondary | ICD-10-CM

## 2020-07-29 DIAGNOSIS — Z3689 Encounter for other specified antenatal screening: Secondary | ICD-10-CM

## 2020-07-29 NOTE — Progress Notes (Signed)
ROB-Doing well, no questions or concerns. Anatomy scan today incomplete, see finding below. Education on health weight gain in pregnancy, see AVS. Anticipatory guidance regarding course of prenatal care. Reviewed red flag symptoms and when to call. RTC x 2-3 weeks for follow up ultrasound and ROB or sooner if needed.   ULTRASOUND REPORT  Location: [facility] Date of Service: 07/29/2020   Indications:Anatomy Ultrasound Findings:  Singleton intrauterine pregnancy is visualized with FHR at 145 BPM. Biometrics give an (U/S) Gestational age of [redacted]w[redacted]d and an (U/S) EDD of 12/13/2020; this correlates with the clinically established Estimated Date of Delivery: 12/12/20  Fetal presentation is Variable.  EFW: 321 g ( 11 oz). Fetal Percentile  Placenta: anterior. Grade: 1 AFI: subjectively normal.  Anatomic survey is incomplete for Spine,LVOT and normal; Gender - female.    Right Ovary is normal in appearance. Left Ovary is normal appearance. Survey of the adnexa demonstrates no adnexal masses. There is no free peritoneal fluid in the cul de sac.  Impression: 1. [redacted]w[redacted]d Viable Singleton Intrauterine pregnancy by U/S. 2. (U/S) EDD is consistent with Clinically established Estimated Date of Delivery: 12/12/20 . 3. Incomplete for LVOT, Spine.  Recommendations: 1.Clinical correlation with the patient's History and Physical Exam.

## 2020-07-29 NOTE — Patient Instructions (Addendum)
Second Trimester of Pregnancy  The second trimester is from week 14 through week 27 (month 4 through 6). This is often the time in pregnancy that you feel your best. Often times, morning sickness has lessened or quit. You may have more energy, and you may get hungry more often. Your unborn baby is growing rapidly. At the end of the sixth month, he or she is about 9 inches long and weighs about 1 pounds. You will likely feel the baby move between 18 and 20 weeks of pregnancy. Follow these instructions at home: Medicines  Take over-the-counter and prescription medicines only as told by your doctor. Some medicines are safe and some medicines are not safe during pregnancy.  Take a prenatal vitamin that contains at least 600 micrograms (mcg) of folic acid.  If you have trouble pooping (constipation), take medicine that will make your stool soft (stool softener) if your doctor approves. Eating and drinking   Eat regular, healthy meals.  Avoid raw meat and uncooked cheese.  If you get low calcium from the food you eat, talk to your doctor about taking a daily calcium supplement.  Avoid foods that are high in fat and sugars, such as fried and sweet foods.  If you feel sick to your stomach (nauseous) or throw up (vomit): ? Eat 4 or 5 small meals a day instead of 3 large meals. ? Try eating a few soda crackers. ? Drink liquids between meals instead of during meals.  To prevent constipation: ? Eat foods that are high in fiber, like fresh fruits and vegetables, whole grains, and beans. ? Drink enough fluids to keep your pee (urine) clear or pale yellow. Activity  Exercise only as told by your doctor. Stop exercising if you start to have cramps.  Do not exercise if it is too hot, too humid, or if you are in a place of great height (high altitude).  Avoid heavy lifting.  Wear low-heeled shoes. Sit and stand up straight.  You can continue to have sex unless your doctor tells you not  to. Relieving pain and discomfort  Wear a good support bra if your breasts are tender.  Take warm water baths (sitz baths) to soothe pain or discomfort caused by hemorrhoids. Use hemorrhoid cream if your doctor approves.  Rest with your legs raised if you have leg cramps or low back pain.  If you develop puffy, bulging veins (varicose veins) in your legs: ? Wear support hose or compression stockings as told by your doctor. ? Raise (elevate) your feet for 15 minutes, 3-4 times a day. ? Limit salt in your food. Prenatal care  Write down your questions. Take them to your prenatal visits.  Keep all your prenatal visits as told by your doctor. This is important. Safety  Wear your seat belt when driving.  Make a list of emergency phone numbers, including numbers for family, friends, the hospital, and police and fire departments. General instructions  Ask your doctor about the right foods to eat or for help finding a counselor, if you need these services.  Ask your doctor about local prenatal classes. Begin classes before month 6 of your pregnancy.  Do not use hot tubs, steam rooms, or saunas.  Do not douche or use tampons or scented sanitary pads.  Do not cross your legs for long periods of time.  Visit your dentist if you have not done so. Use a soft toothbrush to brush your teeth. Floss gently.  Avoid all smoking, herbs,   herbs, and alcohol. Avoid drugs that are not approved by your doctor.  Do not use any products that contain nicotine or tobacco, such as cigarettes and e-cigarettes. If you need help quitting, ask your doctor.  Avoid cat litter boxes and soil used by cats. These carry germs that can cause birth defects in the baby and can cause a loss of your baby (miscarriage) or stillbirth. Contact a doctor if:  You have mild cramps or pressure in your lower belly.  You have pain when you pee (urinate).  You have bad smelling fluid coming from your vagina.  You continue to  feel sick to your stomach (nauseous), throw up (vomit), or have watery poop (diarrhea).  You have a nagging pain in your belly area.  You feel dizzy. Get help right away if:  You have a fever.  You are leaking fluid from your vagina.  You have spotting or bleeding from your vagina.  You have severe belly cramping or pain.  You lose or gain weight rapidly.  You have trouble catching your breath and have chest pain.  You notice sudden or extreme puffiness (swelling) of your face, hands, ankles, feet, or legs.  You have not felt the baby move in over an hour.  You have severe headaches that do not go away when you take medicine.  You have trouble seeing. Summary  The second trimester is from week 14 through week 27 (months 4 through 6). This is often the time in pregnancy that you feel your best.  To take care of yourself and your unborn baby, you will need to eat healthy meals, take medicines only if your doctor tells you to do so, and do activities that are safe for you and your baby.  Call your doctor if you get sick or if you notice anything unusual about your pregnancy. Also, call your doctor if you need help with the right food to eat, or if you want to know what activities are safe for you. This information is not intended to replace advice given to you by your health care provider. Make sure you discuss any questions you have with your health care provider. Document Revised: 03/14/2019 Document Reviewed: 12/26/2016 Elsevier Patient Education  2020 [employer] Inc[employer]   Back Pain in Pregnancy Back pain during pregnancy is common. Back pain may be caused by several factors that are related to changes during your pregnancy. Follow these instructions at home: Managing pain, stiffness, and swelling      If directed, for sudden (acute) back pain, put ice on the painful area. ? Put ice in a plastic bag. ? Place a towel between your skin and the bag. ? Leave the ice on for 20  minutes, 2-3 times per day.  If directed, apply heat to the affected area before you exercise. Use the heat source that your health care provider recommends, such as a moist heat pack or a heating pad. ? Place a towel between your skin and the heat source. ? Leave the heat on for 20-30 minutes. ? Remove the heat if your skin turns bright red. This is especially important if you are unable to feel pain, heat, or cold. You may have a greater risk of getting burned.  If directed, massage the affected area. Activity  Exercise as told by your health care provider. Gentle exercise is the best way to prevent or manage back pain.  Listen to your body when lifting. If lifting hurts, ask for help or bend  your knees. This uses your leg muscles instead of your back muscles.  Squat down when picking up something from the floor. Do not bend over.  Only use bed rest for short periods as told by your health care provider. Bed rest should only be used for the most severe episodes of back pain. Standing, sitting, and lying down  Do not stand in one place for long periods of time.  Use good posture when sitting. Make sure your head rests over your shoulders and is not hanging forward. Use a pillow on your lower back if necessary.  Try sleeping on your side, preferably the left side, with a pregnancy support pillow or 1-2 regular pillows between your legs. ? If you have back pain after a night's rest, your bed may be too soft. ? A firm mattress may provide more support for your back during pregnancy. General instructions  Do not wear high heels.  Eat a healthy diet. Try to gain weight within your health care provider's recommendations.  Use a maternity girdle, elastic sling, or back brace as told by your health care provider.  Take over-the-counter and prescription medicines only as told by your health care provider.  Work with a physical therapist or massage therapist to find ways to manage back  pain. Acupuncture or massage therapy may be helpful.  Keep all follow-up visits as told by your health care provider. This is important. Contact a health care provider if:  Your back pain interferes with your daily activities.  You have increasing pain in other parts of your body. Get help right away if:  You develop numbness, tingling, weakness, or problems with the use of your arms or legs.  You develop severe back pain that is not controlled with medicine.  You have a change in bowel or bladder control.  You develop shortness of breath, dizziness, or you faint.  You develop nausea, vomiting, or sweating.  You have back pain that is a rhythmic, cramping pain similar to labor pains. Labor pain is usually 1-2 minutes apart, lasts for about 1 minute, and involves a bearing down feeling or pressure in your pelvis.  You have back pain and your water breaks or you have vaginal bleeding.  You have back pain or numbness that travels down your leg.  Your back pain developed after you fell.  You develop pain on one side of your back.  You see blood in your urine.  You develop skin blisters in the area of your back pain. Summary  Back pain may be caused by several factors that are related to changes during your pregnancy.  Follow instructions as told by your health care provider for managing pain, stiffness, and swelling.  Exercise as told by your health care provider. Gentle exercise is the best way to prevent or manage back pain.  Take over-the-counter and prescription medicines only as told by your health care provider.  Keep all follow-up visits as told by your health care provider. This is important. This information is not intended to replace advice given to you by your health care provider. Make sure you discuss any questions you have with your health care provider. Document Revised: 03/11/2019 Document Reviewed: 05/08/2018 Elsevier Patient Education  2020 Elsevier  Inc.   Round Ligament Pain  The round ligament is a cord of muscle and tissue that helps support the uterus. It can become a source of pain during pregnancy if it becomes stretched or twisted as the baby grows. The pain  usually begins in the second trimester (13-28 weeks) of pregnancy, and it can come and go until the baby is delivered. It is not a serious problem, and it does not cause harm to the baby. Round ligament pain is usually a short, sharp, and pinching pain, but it can also be a dull, lingering, and aching pain. The pain is felt in the lower side of the abdomen or in the groin. It usually starts deep in the groin and moves up to the outside of the hip area. The pain may occur when you:  Suddenly change position, such as quickly going from a sitting to standing position.  Roll over in bed.  Cough or sneeze.  Do physical activity. Follow these instructions at home:   Watch your condition for any changes.  When the pain starts, relax. Then try any of these methods to help with the pain: ? Sitting down. ? Flexing your knees up to your abdomen. ? Lying on your side with one pillow under your abdomen and another pillow between your legs. ? Sitting in a warm bath for 15-20 minutes or until the pain goes away.  Take over-the-counter and prescription medicines only as told by your health care provider.  Move slowly when you sit down or stand up.  Avoid long walks if they cause pain.  Stop or reduce your physical activities if they cause pain.  Keep all follow-up visits as told by your health care provider. This is important. Contact a health care provider if:  Your pain does not go away with treatment.  You feel pain in your back that you did not have before.  Your medicine is not helping. Get help right away if:  You have a fever or chills.  You develop uterine contractions.  You have vaginal bleeding.  You have nausea or vomiting.  You have diarrhea.  You  have pain when you urinate. Summary  Round ligament pain is felt in the lower abdomen or groin. It is usually a short, sharp, and pinching pain. It can also be a dull, lingering, and aching pain.  This pain usually begins in the second trimester (13-28 weeks). It occurs because the uterus is stretching with the growing baby, and it is not harmful to the baby.  You may notice the pain when you suddenly change position, when you cough or sneeze, or during physical activity.  Relaxing, flexing your knees to your abdomen, lying on one side, or taking a warm bath may help to get rid of the pain.  Get help from your health care provider if the pain does not go away or if you have vaginal bleeding, nausea, vomiting, diarrhea, or painful urination. This information is not intended to replace advice given to you by your health care provider. Make sure you discuss any questions you have with your health care provider. Document Revised: 05/08/2018 Document Reviewed: 05/08/2018 Elsevier Patient Education  2020 Elsevier Inc.   Healthy Weight Gain During Pregnancy, Adult A certain amount of weight gain during pregnancy is normal and healthy. How much weight you should gain depends on your overall health and a measurement called BMI (body mass index). BMI is an estimate of your body fat based on your height and weight. You can use an Software engineer to figure out your BMI, or you can ask your health care provider to calculate it for you at your next visit. Your recommended pregnancy weight gain is based on your pre-pregnancy BMI. General guidelines for a  healthy total weight gain during pregnancy are listed below. If your BMI at or before the start of your pregnancy is:  Less than 18.5 (underweight), you should gain 28-40 lb (13-18 kg).  18.5-24.9 (normal weight), you should gain 25-35 lb (11-16 kg).  25-29.9 (overweight), you should gain 15-25 lb (7-11 kg).  30 or higher (obese), you should gain  11-20 lb (5-9 kg). These ranges vary depending on your individual health. If you are carrying more than one baby (multiples), it may be safe to gain more weight than these recommendations. If you gain less weight than recommended, that may be safe as long as your baby is growing and developing normally. How can unhealthy weight gain affect me and my baby? Gaining too much weight during pregnancy can lead to pregnancy complications, such as:  A temporary form of diabetes that develops during pregnancy (gestational diabetes).  High blood pressure during pregnancy and protein in your urine (preeclampsia).  High blood pressure during pregnancy without protein in your urine (gestational hypertension).  Your baby having a high weight at birth, which may: ? Raise your risk of having a more difficult delivery or a surgical delivery (cesarean delivery, or C-section). ? Raise your child's risk of developing obesity during childhood. Not gaining enough weight can be life-threatening for your baby, and it may raise your baby's chances of:  Being born early (preterm).  Growing more slowly than normal during pregnancy (growth restriction).  Having a low weight at birth. What actions can I take to gain a healthy amount of weight during pregnancy? General instructions  Keep track of your weight gain during pregnancy.  Take over-the-counter and prescription medicines only as told by your health care provider. Take all prenatal supplements as directed.  Keep all health care visits during pregnancy (prenatal visits). These visits are a good time to discuss your weight gain. Your health care provider will weigh you at each visit to make sure you are gaining a healthy amount of weight. Nutrition   Eat a balanced, nutrient-rich diet. Eat plenty of: ? Fruits and vegetables, such as berries and broccoli. ? Whole grains, such as millet, barley, whole-wheat breads and cereals, and oatmeal. ? Low-fat dairy  products or non-dairy products such as almond milk or rice milk. ? Protein foods, such as lean meat, chicken, eggs, and legumes (such as peas, beans, soybeans, and lentils).  Avoid foods that are fried or have a lot of fat, salt (sodium), or sugar.  Drink enough fluid to keep your urine pale yellow.  Choose healthy snack and drink options when you are at work or on the go: ? Drink water. Avoid soda, sports drinks, and juices that have added sugar. ? Avoid drinks with caffeine, such as coffee and energy drinks. ? Eat snacks that are high in protein, such as nuts, protein bars, and low-fat yogurt. ? Carry convenient snacks in your purse that do not need refrigeration, such as a pack of trail mix, an apple, or a granola bar.  If you need help improving your diet, work with a health care provider or a diet and nutrition specialist (dietitian). Activity   Exercise regularly, as told by your health care provider. ? If you were active before becoming pregnant, you may be able to continue your regular fitness activities. ? If you were not active before pregnancy, you may gradually build up to exercising for 30 or more minutes on most days of the week. This may include walking, swimming, or yoga.  Ask your health care provider what activities are safe for you. Talk with your health care provider about whether you may need to be excused from certain school or work activities. Where to find more information Learn more about managing your weight gain during pregnancy from:  American Pregnancy Association: www.americanpregnancy.org  [place] Department of Agriculture pregnancy weight gain calculator: https://ball-collins.biz/www.choosemyplate.gov Summary  Too much weight gain during pregnancy can lead to complications for you and your baby.  Find out your pre-pregnancy BMI to determine how much weight gain is healthy for you.  Eat nutritious foods and stay active.  Keep all of your prenatal visits as told by your health  care provider. This information is not intended to replace advice given to you by your health care provider. Make sure you discuss any questions you have with your health care provider. Document Revised: 08/13/2019 Document Reviewed: 08/10/2017 Elsevier Patient Education  2020 [employer]

## 2020-07-29 NOTE — Addendum Note (Signed)
Addended by: Blair Heys on: 07/29/2020 03:35 PM   Modules accepted: Orders

## 2020-08-04 DIAGNOSIS — Z419 Encounter for procedure for purposes other than remedying health state, unspecified: Secondary | ICD-10-CM | POA: Diagnosis not present

## 2020-08-17 ENCOUNTER — Ambulatory Visit (INDEPENDENT_AMBULATORY_CARE_PROVIDER_SITE_OTHER): Payer: Medicaid Other

## 2020-08-17 ENCOUNTER — Other Ambulatory Visit: Payer: Self-pay

## 2020-08-17 ENCOUNTER — Ambulatory Visit (INDEPENDENT_AMBULATORY_CARE_PROVIDER_SITE_OTHER): Payer: Medicaid Other | Admitting: Certified Nurse Midwife

## 2020-08-17 ENCOUNTER — Encounter: Payer: Self-pay | Admitting: Certified Nurse Midwife

## 2020-08-17 VITALS — BP 102/59 | HR 105 | Wt 186.0 lb

## 2020-08-17 DIAGNOSIS — Z3A23 23 weeks gestation of pregnancy: Secondary | ICD-10-CM | POA: Diagnosis not present

## 2020-08-17 DIAGNOSIS — Z3689 Encounter for other specified antenatal screening: Secondary | ICD-10-CM

## 2020-08-17 DIAGNOSIS — Z3A20 20 weeks gestation of pregnancy: Secondary | ICD-10-CM

## 2020-08-17 DIAGNOSIS — Z3402 Encounter for supervision of normal first pregnancy, second trimester: Secondary | ICD-10-CM

## 2020-08-17 LAB — POCT URINALYSIS DIPSTICK OB
Bilirubin, UA: NEGATIVE
Blood, UA: NEGATIVE
Glucose, UA: NEGATIVE
Ketones, UA: NEGATIVE
Leukocytes, UA: NEGATIVE
Nitrite, UA: NEGATIVE
POC,PROTEIN,UA: NEGATIVE
Spec Grav, UA: 1.025 (ref 1.010–1.025)
Urobilinogen, UA: 0.2 E.U./dL
pH, UA: 6 (ref 5.0–8.0)

## 2020-08-17 NOTE — Progress Notes (Signed)
ROB doing well. Feels movement. U/s today for completion of anatomy (see below), scan still not complete (spine) pt to return 1-2 wks for completed. Anticipatory guidance 28 wk visit. She verbalizes understanding and agreement. ROB in 4 wk with Marcelino Duster.   Doreene Burke, CNM  Patient Name: Danielle Gillespie DOB: 02/05/1991 MRN: [id number] ULTRASOUND REPORT  Location: [facility] Date of Service: 08/17/2020   Indications: Anatomy follow up ultrasound Findings:  Mason Jim intrauterine pregnancy is visualized with FHR at 151 BPM.  Fetal presentation is Cephalic.  Placenta: anterior. Grade: 1 AFI: subjectively normal.  Anatomic survey is incomplete for spine.   There is no free peritoneal fluid in the cul de sac.  Impression: 1. [redacted]w[redacted]d Viable Singleton Intrauterine pregnancy previously established criteria. 2. Normal Anatomy Scan is still incomplete for spine.  Recommendations: 1.Clinical correlation with the patient's History and Physical Exam.

## 2020-08-17 NOTE — Patient Instructions (Signed)

## 2020-08-31 ENCOUNTER — Other Ambulatory Visit: Payer: Self-pay

## 2020-08-31 ENCOUNTER — Ambulatory Visit (INDEPENDENT_AMBULATORY_CARE_PROVIDER_SITE_OTHER): Payer: Medicaid Other

## 2020-08-31 DIAGNOSIS — N912 Amenorrhea, unspecified: Secondary | ICD-10-CM

## 2020-09-03 DIAGNOSIS — Z419 Encounter for procedure for purposes other than remedying health state, unspecified: Secondary | ICD-10-CM | POA: Diagnosis not present

## 2020-09-16 ENCOUNTER — Ambulatory Visit (INDEPENDENT_AMBULATORY_CARE_PROVIDER_SITE_OTHER): Payer: Medicaid Other | Admitting: Certified Nurse Midwife

## 2020-09-16 ENCOUNTER — Other Ambulatory Visit: Payer: Medicaid Other

## 2020-09-16 ENCOUNTER — Other Ambulatory Visit: Payer: Self-pay

## 2020-09-16 ENCOUNTER — Encounter: Payer: Self-pay | Admitting: Certified Nurse Midwife

## 2020-09-16 VITALS — BP 88/68 | HR 87 | Wt 192.5 lb

## 2020-09-16 DIAGNOSIS — Z131 Encounter for screening for diabetes mellitus: Secondary | ICD-10-CM

## 2020-09-16 DIAGNOSIS — Z23 Encounter for immunization: Secondary | ICD-10-CM

## 2020-09-16 DIAGNOSIS — Z3A27 27 weeks gestation of pregnancy: Secondary | ICD-10-CM

## 2020-09-16 DIAGNOSIS — Z113 Encounter for screening for infections with a predominantly sexual mode of transmission: Secondary | ICD-10-CM

## 2020-09-16 DIAGNOSIS — Z3402 Encounter for supervision of normal first pregnancy, second trimester: Secondary | ICD-10-CM

## 2020-09-16 DIAGNOSIS — Z130 Encounter for screening for diseases of the blood and blood-forming organs and certain disorders involving the immune mechanism: Secondary | ICD-10-CM

## 2020-09-16 DIAGNOSIS — O99619 Diseases of the digestive system complicating pregnancy, unspecified trimester: Secondary | ICD-10-CM

## 2020-09-16 DIAGNOSIS — K219 Gastro-esophageal reflux disease without esophagitis: Secondary | ICD-10-CM

## 2020-09-16 LAB — POCT URINALYSIS DIPSTICK OB
Bilirubin, UA: NEGATIVE
Blood, UA: NEGATIVE
Glucose, UA: NEGATIVE
Ketones, UA: NEGATIVE
Leukocytes, UA: NEGATIVE
Nitrite, UA: NEGATIVE
POC,PROTEIN,UA: NEGATIVE
Spec Grav, UA: 1.015 (ref 1.010–1.025)
Urobilinogen, UA: 0.2 E.U./dL
pH, UA: 7.5 (ref 5.0–8.0)

## 2020-09-16 MED ORDER — FAMOTIDINE 20 MG PO TABS: 20.0000 mg | ORAL_TABLET | Freq: Two times a day (BID) | ORAL | 3 refills | Status: DC

## 2020-09-16 NOTE — Progress Notes (Signed)
Reports increased reflux and use of MJ for decreased appetite. Education provided, see AVS. Rx Pepcid, see orders. Blood transfusion consent completed. TDaP given, see chart. 28 week labs today, see orders. Third trimester handouts provided. Plans epidural, breastfeeding (see checklist), and pill as contraception. Anticipatory guidance regarding course of prenatal care. Reviewed red flag symptoms and when to call. RTC x 2 weeks for ROB or sooner if needed.

## 2020-09-16 NOTE — Patient Instructions (Addendum)
WHAT OB PATIENTS CAN EXPECT   Confirmation of pregnancy and ultrasound ordered if medically indicated-[redacted] weeks gestation  New OB (NOB) intake with nurse and New OB (NOB) labs- [redacted] weeks gestation  New OB (NOB) physical examination with provider- 11/[redacted] weeks gestation  Flu vaccine-[redacted] weeks gestation  Anatomy scan-[redacted] weeks gestation  Glucose tolerance test, blood work to test for anemia, T-dap vaccine-[redacted] weeks gestation  Vaginal swabs/cultures-STD/Group B strep-[redacted] weeks gestation  Appointments every 4 weeks until 28 weeks  Every 2 weeks from 28 weeks until 36 weeks  Weekly visits from 36 weeks until delivery    Healthy Weight Gain During Pregnancy, Adult A certain amount of weight gain during pregnancy is normal and healthy. How much weight you should gain depends on your overall health and a measurement called BMI (body mass index). BMI is an estimate of your body fat based on your height and weight. You can use an Freight [employer]er to figure out your BMI, or you can ask your health care provider to calculate it for you at your next visit. Your recommended pregnancy weight gain is based on your pre-pregnancy BMI. General guidelines for a healthy total weight gain during pregnancy are listed below. If your BMI at or before the start of your pregnancy is:  Less than 18.5 (underweight), you should gain 28-40 lb (13-18 kg).  18.5-24.9 (normal weight), you should gain 25-35 lb (11-16 kg).  25-29.9 (overweight), you should gain 15-25 lb (7-11 kg).  30 or higher (obese), you should gain 11-20 lb (5-9 kg). These ranges vary depending on your individual health. If you are carrying more than one baby (multiples), it may be safe to gain more weight than these recommendations. If you gain less weight than recommended, that may be safe as long as your baby is growing and developing normally. How can unhealthy weight gain affect me and my baby? Gaining too much weight during pregnancy can lead to  pregnancy complications, such as:  A temporary form of diabetes that develops during pregnancy (gestational diabetes).  High blood pressure during pregnancy and protein in your urine (preeclampsia).  High blood pressure during pregnancy without protein in your urine (gestational hypertension).  Your baby having a high weight at birth, which may: ? Raise your risk of having a more difficult delivery or a surgical delivery (cesarean delivery, or C-section). ? Raise your child's risk of developing obesity during childhood. Not gaining enough weight can be life-threatening for your baby, and it may raise your baby's chances of:  Being born early (preterm).  Growing more slowly than normal during pregnancy (growth restriction).  Having a low weight at birth. What actions can I take to gain a healthy amount of weight during pregnancy? General instructions  Keep track of your weight gain during pregnancy.  Take over-the-counter and prescription medicines only as told by your health care provider. Take all prenatal supplements as directed.  Keep all health care visits during pregnancy (prenatal visits). These visits are a good time to discuss your weight gain. Your health care provider will weigh you at each visit to make sure you are gaining a healthy amount of weight. Nutrition   Eat a balanced, nutrient-rich diet. Eat plenty of: ? Fruits and vegetables, such as berries and broccoli. ? Whole grains, such as millet, barley, whole-wheat breads and cereals, and oatmeal. ? Low-fat dairy products or non-dairy products such as almond milk or rice milk. ? Protein foods, such as lean meat, chicken, eggs, and legumes (such as  peas, beans, soybeans, and lentils).  Avoid foods that are fried or have a lot of fat, salt (sodium), or sugar.  Drink enough fluid to keep your urine pale yellow.  Choose healthy snack and drink options when you are at work or on the go: ? Drink water. Avoid soda,  sports drinks, and juices that have added sugar. ? Avoid drinks with caffeine, such as coffee and energy drinks. ? Eat snacks that are high in protein, such as nuts, protein bars, and low-fat yogurt. ? Carry convenient snacks in your purse that do not need refrigeration, such as a pack of trail mix, an apple, or a granola bar.  If you need help improving your diet, work with a health care provider or a diet and nutrition specialist (dietitian). Activity   Exercise regularly, as told by your health care provider. ? If you were active before becoming pregnant, you may be able to continue your regular fitness activities. ? If you were not active before pregnancy, you may gradually build up to exercising for 30 or more minutes on most days of the week. This may include walking, swimming, or yoga.  Ask your health care provider what activities are safe for you. Talk with your health care provider about whether you may need to be excused from certain school or work activities. Where to find more information Learn more about managing your weight gain during pregnancy from:  American Pregnancy Association: www.americanpregnancy.org  [place] Department of Agriculture pregnancy weight gain calculator: [employer] Summary  Too much weight gain during pregnancy can lead to complications for you and your baby.  Find out your pre-pregnancy BMI to determine how much weight gain is healthy for you.  Eat nutritious foods and stay active.  Keep all of your prenatal visits as told by your health care provider. This information is not intended to replace advice given to you by your health care provider. Make sure you discuss any questions you have with your health care provider. Document Revised: 08/13/2019 Document Reviewed: 08/10/2017 Elsevier Patient Education  [place].   Exercise During Pregnancy Exercise is an important part of being healthy for people of all ages. Exercise  improves the function of your heart and lungs and helps you maintain strength, flexibility, and a healthy body weight. Exercise also boosts energy levels and elevates mood. Most women should exercise regularly during pregnancy. In rare cases, women with certain medical conditions or complications may be asked to limit or avoid exercise during pregnancy. How does this affect me? Along with maintaining general strength and flexibility, exercising during pregnancy can help:  Keep strength in muscles that are used during labor and childbirth.  Decrease low back pain.  Reduce symptoms of depression.  Control weight gain during pregnancy.  Reduce the risk of needing insulin if you develop diabetes during pregnancy.  Decrease the risk of cesarean delivery.  Speed up your recovery after giving birth. How does this affect my baby? Exercise can help you have a healthy pregnancy. Exercise does not cause premature birth. It will not cause your baby to weigh less at birth. What exercises can I do? Many exercises are safe for you to do during pregnancy. Do a variety of exercises that safely increase your heart and breathing rates and help you build and maintain muscle strength. Do exercises exactly as told by your health care provider. You may do these exercises:  Walking or hiking.  Swimming.  Water aerobics.  Riding a stationary bike.  Strength  training.  Modified yoga or Pilates. Tell your instructor that you are pregnant. Avoid overstretching, and avoid lying on your back for long periods of time.  Running or jogging. Only choose this type of exercise if you: ? Ran or jogged regularly before your pregnancy. ? Can run or jog and still talk in complete sentences. What exercises should I avoid? Depending on your level of fitness and whether you exercised regularly before your pregnancy, you may be told to limit high-intensity exercise. You can tell that you are exercising at a high  intensity if you are breathing much harder and faster and cannot hold a conversation while exercising. You must avoid:  Contact sports.  Activities that put you at risk for falling on or being hit in the belly, such as downhill skiing, water skiing, surfing, rock climbing, cycling, gymnastics, and horseback riding.  Scuba diving.  Skydiving.  Yoga or Pilates in a room that is heated to high temperatures.  Jogging or running, unless you ran or jogged regularly before your pregnancy. While jogging or running, you should always be able to talk in full sentences. Do not run or jog so fast that you are unable to have a conversation.  Do not exercise at more than 6,000 feet above sea level (high elevation) if you are not used to exercising at high elevation. How do I exercise in a safe way?   Avoid overheating. Do not exercise in very high temperatures.  Wear loose-fitting, breathable clothes.  Avoid dehydration. Drink enough water before, during, and after exercise to keep your urine pale yellow.  Avoid overstretching. Because of hormone changes during pregnancy, it is easy to overstretch muscles, tendons, and ligaments during pregnancy.  Start slowly and ask your health care provider to recommend the types of exercise that are safe for you.  Do not exercise to lose weight. Follow these instructions at home:  Exercise on most days or all days of the week. Try to exercise for 30 minutes a day, 5 days a week, unless your health care provider tells you not to.  If you actively exercised before your pregnancy and you are healthy, your health care provider may tell you to continue to do moderate to high-intensity exercise.  If you are just starting to exercise or did not exercise much before your pregnancy, your health care provider may tell you to do low to moderate-intensity exercise. Questions to ask your health care provider  Is exercise safe for me?  What are signs that I should  stop exercising?  Does my health condition mean that I should not exercise during pregnancy?  When should I avoid exercising during pregnancy? Stop exercising and contact a health care provider if: You have any unusual symptoms, such as:  Mild contractions of the uterus or cramps in the abdomen.  Dizziness that does not go away when you rest. Stop exercising and get help right away if: You have any unusual symptoms, such as:  Sudden, severe pain in your low back or your belly.  Mild contractions of the uterus or cramps in the abdomen that do not improve with rest and drinking fluids.  Chest pain.  Bleeding or fluid leaking from your vagina.  Shortness of breath. These symptoms may represent a serious problem that is an emergency. Do not wait to see if the symptoms will go away. Get medical help right away. Call your local emergency services (911 in the [place] Do not drive yourself to the hospital. Summary  Most  women should exercise regularly throughout pregnancy. In rare cases, women with certain medical conditions or complications may be asked to limit or avoid exercise during pregnancy.  Do not exercise to lose weight during pregnancy.  Your health care provider will tell you what level of physical activity is right for you.  Stop exercising and contact a health care provider if you have mild contractions of the uterus or cramps in the abdomen. Get help right away if these contractions or cramps do not improve with rest and drinking fluids.  Stop exercising and get help right away if you have sudden, severe pain in your low back or belly, chest pain, shortness of breath, or bleeding or leaking of fluid from your vagina. This information is not intended to replace advice given to you by your health care provider. Make sure you discuss any questions you have with your health care provider. Document Revised: 03/13/2019 Document Reviewed: 12/25/2018 Elsevier Patient Education   2020 [employer]vier Inc.    Common Medications Safe in Pregnancy  Acne:      Constipation:  Benzoyl Peroxide     Colace  Clindamycin      Dulcolax Suppository  Topica Erythromycin     Fibercon  Salicylic Acid      Metamucil         Miralax AVOID:        Senakot   Accutane    Cough:  Retin-A       Cough Drops  Tetracycline      Phenergan w/ Codeine if Rx  Minocycline      Robitussin (Plain & DM)  Antibiotics:     Crabs/Lice:  Ceclor       RID  Cephalosporins    AVOID:  E-Mycins      Kwell  Keflex  Macrobid/Macrodantin   Diarrhea:  Penicillin      Kao-Pectate  Zithromax      Imodium AD         PUSH FLUIDS AVOID:       Cipro     Fever:  Tetracycline      Tylenol (Regular or Extra  Minocycline       Strength)  Levaquin      Extra Strength-Do not          Exceed 8 tabs/24 hrs Caffeine:        <200mg/day (equiv. To 1 cup of coffee or  approx. 3 12 oz sodas)         Gas: Cold/Hayfever:       Gas-X  Benadryl      Mylicon  Claritin       Phazyme  **Claritin-D        Chlor-Trimeton    Headaches:  Dimetapp      ASA-Free Excedrin  Drixoral-Non-Drowsy     Cold Compress  Mucinex (Guaifenasin)     Tylenol (Regular or Extra  Sudafed/Sudafed-12 Hour     Strength)  **Sudafed PE Pseudoephedrine   Tylenol Cold & Sinus     Vicks Vapor Rub  Zyrtec  **AVOID if Problems With Blood Pressure         Heartburn: Avoid lying down for at least 1 hour after meals  Aciphex      Maalox     Rash:  Milk of Magnesia     Benadryl    Mylanta       1% Hydrocortisone Cream  Pepcid  Pepcid Complete   Sleep Aids:  Prevacid      Ambien     Prilosec       Benadryl  Rolaids       Chamomile Tea  Tums (Limit 4/day)     Unisom         Tylenol PM         Warm milk-add vanilla or  Hemorrhoids:       Sugar for taste  Anusol/Anusol H.C.  (RX: Analapram 2.5%)  Sugar Substitutes:  Hydrocortisone OTC     Ok in moderation  Preparation H      Tucks        Vaseline lotion applied to tissue with  wiping    Herpes:     Throat:  Acyclovir      Oragel  Famvir  Valtrex     Vaccines:         Flu Shot Leg Cramps:       *Gardasil  Benadryl      Hepatitis A         Hepatitis B Nasal Spray:       Pneumovax  Saline Nasal Spray     Polio Booster         Tetanus Nausea:       Tuberculosis test or PPD  Vitamin B6 25 mg TID   AVOID:    Dramamine      *Gardasil  Emetrol       Live Poliovirus  Ginger Root 250 mg QID    MMR (measles, mumps &  High Complex Carbs @ Bedtime    rebella)  Sea Bands-Accupressure    Varicella (Chickenpox)  Unisom 1/2 tab TID     *No known complications           If received before Pain:         Known pregnancy;   Darvocet       Resume series after  Lortab        Delivery  Percocet    Yeast:   Tramadol      Femstat  Tylenol 3      Gyne-lotrimin  Ultram       Monistat  Vicodin           MISC:         All Sunscreens           Hair Coloring/highlights          Insect Repellant's          (Including DEET)         Mystic Tans    Third Trimester of Pregnancy  The third trimester is from week 28 through week 40 (months 7 through 9). This trimester is when your unborn baby (fetus) is growing very fast. At the end of the ninth month, the unborn baby is about 20 inches in length. It weighs about 6-10 pounds. Follow these instructions at home: Medicines  Take over-the-counter and prescription medicines only as told by your doctor. Some medicines are safe and some medicines are not safe during pregnancy.  Take a prenatal vitamin that contains at least 600 micrograms (mcg) of folic acid.  If you have trouble pooping (constipation), take medicine that will make your stool soft (stool softener) if your doctor approves. Eating and drinking   Eat regular, healthy meals.  Avoid raw meat and uncooked cheese.  If you get low calcium from the food you eat, talk to your doctor about taking a daily calcium supplement.  Eat four or five small meals rather than  three large meals a day.  Avoid  foods that are high in fat and sugars, such as fried and sweet foods.  To prevent constipation: ? Eat foods that are high in fiber, like fresh fruits and vegetables, whole grains, and beans. ? Drink enough fluids to keep your pee (urine) clear or pale yellow. Activity  Exercise only as told by your doctor. Stop exercising if you start to have cramps.  Avoid heavy lifting, wear low heels, and sit up straight.  Do not exercise if it is too hot, too humid, or if you are in a place of great height (high altitude).  You may continue to have sex unless your doctor tells you not to. Relieving pain and discomfort  Wear a good support bra if your breasts are tender.  Take frequent breaks and rest with your legs raised if you have leg cramps or low back pain.  Take warm water baths (sitz baths) to soothe pain or discomfort caused by hemorrhoids. Use hemorrhoid cream if your doctor approves.  If you develop puffy, bulging veins (varicose veins) in your legs: ? Wear support hose or compression stockings as told by your doctor. ? Raise (elevate) your feet for 15 minutes, 3-4 times a day. ? Limit salt in your food. Safety  Wear your seat belt when driving.  Make a list of emergency phone numbers, including numbers for family, friends, the hospital, and police and fire departments. Preparing for your baby's arrival To prepare for the arrival of your baby:  Take prenatal classes.  Practice driving to the hospital.  Visit the hospital and tour the maternity area.  Talk to your work about taking leave once the baby comes.  Pack your hospital bag.  Prepare the baby's room.  Go to your doctor visits.  Buy a rear-facing car seat. Learn how to install it in your car. General instructions  Do not use hot tubs, steam rooms, or saunas.  Do not use any products that contain nicotine or tobacco, such as cigarettes and e-cigarettes. If you need help  quitting, ask your doctor.  Do not drink alcohol.  Do not douche or use tampons or scented sanitary pads.  Do not cross your legs for long periods of time.  Do not travel for long distances unless you must. Only do so if your doctor says it is okay.  Visit your dentist if you have not gone during your pregnancy. Use a soft toothbrush to brush your teeth. Be gentle when you floss.  Avoid cat litter boxes and soil used by cats. These carry germs that can cause birth defects in the baby and can cause a loss of your baby (miscarriage) or stillbirth.  Keep all your prenatal visits as told by your doctor. This is important. Contact a doctor if:  You are not sure if you are in labor or if your water has broken.  You are dizzy.  You have mild cramps or pressure in your lower belly.  You have a nagging pain in your belly area.  You continue to feel sick to your stomach, you throw up, or you have watery poop.  You have bad smelling fluid coming from your vagina.  You have pain when you pee. Get help right away if:  You have a fever.  You are leaking fluid from your vagina.  You are spotting or bleeding from your vagina.  You have severe belly cramps or pain.  You lose or gain weight quickly.  You have trouble catching your breath and have chest pain.  You notice sudden or extreme puffiness (swelling) of your face, hands, ankles, feet, or legs.  You have not felt the baby move in over an hour.  You have severe headaches that do not go away with medicine.  You have trouble seeing.  You are leaking, or you are having a gush of fluid, from your vagina before you are 37 weeks.  You have regular belly spasms (contractions) before you are 37 weeks. Summary  The third trimester is from week 28 through week 40 (months 7 through 9). This time is when your unborn baby is growing very fast.  Follow your doctor's advice about medicine, food, and activity.  Get ready for the  arrival of your baby by taking prenatal classes, getting all the baby items ready, preparing the baby's room, and visiting your doctor to be checked.  Get help right away if you are bleeding from your vagina, or you have chest pain and trouble catching your breath, or if you have not felt your baby move in over an hour. This information is not intended to replace advice given to you by your health care provider. Make sure you discuss any questions you have with your health care provider. Document Revised: 03/13/2019 Document Reviewed: 12/26/2016 Elsevier Patient Education  2020 [employer].  Marijuana Use During Pregnancy and Breastfeeding  Marijuana is the dried leaves, flowers, and stems of the Cannabis sativa or Cannabis indica plant. The plant's active ingredients (cannabinoids), including a chemical called THC, change the chemistry of the brain. Marijuana smoke also has many of the same chemicals as cigarette smoke that cause breathing problems. Marijuana gets into your blood through your lungs when you smoke it and through your digestive system when you swallow it. Using marijuana in any form may be harmful for you and your baby when you are trying to become pregnant and during pregnancy. This includes marijuana that is prescribed to you by a health care provider (medical marijuana). Once marijuana is in your blood, it can travel through your placenta to your baby. It may also pass through breast milk. How does this affect me? Marijuana affects you both mentally and physically. Using marijuana can make you feel high and relaxed. It can also have negative effects, especially at high doses or with long-term use. These include:  Rapid heartbeat and stress on your heart.  Lung irritation and breathing problems.  Difficulty thinking and making decisions.  Seeing or believing things that are not true (hallucinations and paranoia).  Mood swings, depression, or anxiety.  Decreased ability to  learn and remember.  Difficulty getting pregnant. Marijuana can also affect your pregnancy. Not all the effects are known. However, if you use marijuana during pregnancy, you may:  Be less likely to get regular prenatal care and do the things that you need to do to have a healthy pregnancy.  Be more likely to use other drugs that can harm your pregnancy, like drinking alcohol and smoking cigarettes.  Be at higher risk of having your baby die after 28 weeks of pregnancy (stillbirth).  Be at higher risk of giving birth before 37 weeks of pregnancy (premature birth). How does this affect my baby? If you use marijuana during pregnancy, this may affect your baby's development, birth, and life after birth. Your baby may:  Be born prematurely, which can cause physical and mental problems.  Be born with a low birth weight, which can lead to physical and mental problems.  Have problems with brain development.  Have  difficulty growing.  Have attention and behavior problems later in life.  Do poorly at school and have learning problems later in life.  Have problems with vision and coordination.  Be at higher risk for using marijuana by age 51. More research is needed to find out exactly how marijuana affects a baby during breastfeeding. Some studies suggest that the chemicals in marijuana can be passed to a baby through breast milk. To limit possible risks, you should not use marijuana during breastfeeding. Follow these instructions at home:  Let your health care provider know if you use marijuana before trying to get pregnant, during pregnancy, or during breastfeeding.  Do not use marijuana in any form when you are trying to get pregnant, when you are pregnant, or when you are breastfeeding. If you are having trouble stopping marijuana use, ask your health care provider for help.  Do not smoke. If you need help quitting, ask your health care provider for help.  If you are using medical  marijuana, ask your health care provider to switch you to a medicine that is safer to use during pregnancy or breastfeeding.  Keep all your prenatal visits as told by your health care provider. This is important. Where to find more information [employer] on Drug Abuse: www.drugabuse.gov March of Dimes: www.marchofdimes.org/pregnancy Contact a health care provider if:  You use marijuana and want to get pregnant.  You use marijuana during pregnancy or breastfeeding.  You need help stopping marijuana use. Get help right away if:  Your baby is not gaining weight or growing as expected. Summary  Using marijuana in any form may be harmful for you and your baby when you are trying to become pregnant, during pregnancy, and during breastfeeding. This includes marijuana that is prescribed to you (medical marijuana).  Some studies suggest that marijuana may pass through breast milk and can affect your baby's brain development.  Talk to your health care provider if you use marijuana in any form while trying to get pregnant, during pregnancy, or while breastfeeding.  Ask your health care provider for help if you are not able to stop using marijuana. This information is not intended to replace advice given to you by your health care provider. Make sure you discuss any questions you have with your health care provider. Document Revised: 03/14/2019 Document Reviewed: 08/08/2017 Elsevier Patient Education  [place].

## 2020-09-17 LAB — CBC
Hematocrit: 32.7 % — ABNORMAL LOW (ref 34.0–46.6)
Hemoglobin: 11.2 g/dL (ref 11.1–15.9)
MCH: 33.6 pg — ABNORMAL HIGH (ref 26.6–33.0)
MCHC: 34.3 g/dL (ref 31.5–35.7)
MCV: 98 fL — ABNORMAL HIGH (ref 79–97)
Platelets: 360 10*3/uL (ref 150–450)
RBC: 3.33 x10E6/uL — ABNORMAL LOW (ref 3.77–5.28)
RDW: 12.9 % (ref 11.7–15.4)
WBC: 16.2 10*3/uL — ABNORMAL HIGH (ref 3.4–10.8)

## 2020-09-17 LAB — RPR: RPR Ser Ql: NONREACTIVE

## 2020-09-17 LAB — GLUCOSE, 1 HOUR GESTATIONAL: Gestational Diabetes Screen: 122 mg/dL (ref 65–139)

## 2020-09-29 ENCOUNTER — Encounter: Payer: Self-pay | Admitting: Certified Nurse Midwife

## 2020-09-29 ENCOUNTER — Other Ambulatory Visit: Payer: Self-pay

## 2020-09-29 ENCOUNTER — Ambulatory Visit (INDEPENDENT_AMBULATORY_CARE_PROVIDER_SITE_OTHER): Payer: Medicaid Other | Admitting: Certified Nurse Midwife

## 2020-09-29 VITALS — BP 90/48 | HR 84 | Wt 193.4 lb

## 2020-09-29 DIAGNOSIS — Z3A29 29 weeks gestation of pregnancy: Secondary | ICD-10-CM

## 2020-09-29 LAB — POCT URINALYSIS DIPSTICK OB
Bilirubin, UA: NEGATIVE
Blood, UA: NEGATIVE
Glucose, UA: NEGATIVE
Ketones, UA: NEGATIVE
Leukocytes, UA: NEGATIVE
Nitrite, UA: NEGATIVE
POC,PROTEIN,UA: NEGATIVE
Spec Grav, UA: 1.015 (ref 1.010–1.025)
Urobilinogen, UA: 0.2 E.U./dL
pH, UA: 6 (ref 5.0–8.0)

## 2020-09-29 NOTE — Progress Notes (Signed)
ROB doing well. Feels good movement. RSB reviewed. See check list for topics reviewed. Follow up 2 wk with Marcelino Duster.   Doreene Burke, CNM

## 2020-09-29 NOTE — Patient Instructions (Signed)

## 2020-10-04 DIAGNOSIS — Z419 Encounter for procedure for purposes other than remedying health state, unspecified: Secondary | ICD-10-CM | POA: Diagnosis not present

## 2020-10-14 ENCOUNTER — Ambulatory Visit (INDEPENDENT_AMBULATORY_CARE_PROVIDER_SITE_OTHER): Payer: Medicaid Other | Admitting: Certified Nurse Midwife

## 2020-10-14 ENCOUNTER — Encounter: Payer: Self-pay | Admitting: Certified Nurse Midwife

## 2020-10-14 ENCOUNTER — Other Ambulatory Visit: Payer: Self-pay

## 2020-10-14 VITALS — BP 102/70 | HR 106 | Wt 194.6 lb

## 2020-10-14 DIAGNOSIS — Z3403 Encounter for supervision of normal first pregnancy, third trimester: Secondary | ICD-10-CM

## 2020-10-14 DIAGNOSIS — Z3A31 31 weeks gestation of pregnancy: Secondary | ICD-10-CM

## 2020-10-14 LAB — POCT URINALYSIS DIPSTICK
Bilirubin, UA: NEGATIVE
Blood, UA: NEGATIVE
Glucose, UA: NEGATIVE
Ketones, UA: NEGATIVE
Leukocytes, UA: NEGATIVE
Nitrite, UA: NEGATIVE
Protein, UA: NEGATIVE
Spec Grav, UA: 1.015 (ref 1.010–1.025)
Urobilinogen, UA: 0.2 E.U./dL
pH, UA: 7.5 (ref 5.0–8.0)

## 2020-10-14 NOTE — Progress Notes (Signed)
ROB-Doing well, discussed low blood pressure in pregnancy and home treatment measures. Traveling to [place] for Thanksgiving. Travel and Pregnancy handout provided. Anticipatory guidance regarding course of prenatal care. Reviewed red flag symptoms and when to call. RTC x 2-3 weeks for ROB or sooner if needed.

## 2020-10-14 NOTE — Patient Instructions (Addendum)
Pregnancy and Travel  Most pregnant women can safely travel until the last month of their pregnancy. Your doctor may recommend limiting or avoiding travel depending on how far you are in the pregnancy, and if you have any medical or pregnancy problems. General travel tips Before you go:  Discuss your trip with your doctor. Get examined shortly before you go.  Get a copy of your medical records. Take it with you.  Try to get names of doctors and hospitals in the area where you will be visiting.  Pack your pillow.  Pack any approved medicines and supplements.  Get enough sleep the night before the trip. During your trip:  Ask for locations of doctors and hospitals.  Wear flat, comfortable shoes.  Wear loose-fitting, comfortable clothes.  Wear compression stockings as told by your doctor. They may prevent blood clots that arise from sitting for a long time.  Do leg exercises as told by your doctor.  Eat a balanced diet, drink lots of fluid, and take your vitamins and supplements.  Take water, crackers, and fruit with you.  Take breaks to use the restroom and walk every 2 hours or during stops.  Do not wear yourself out.  Do not ride on a motorcycle.  Rest. If your trip is long, lie down for 30 or more minutes with your feet slightly raised after you reach your destination.  Always wear a seat belt. Tips for traveling to a foreign country Before you go:  Ask your doctor if there are medicines that are safe for you to take if you get diarrhea, constipation, nausea, or vomiting.  Check with your health insurance provider about medical coverage abroad. Purchase travel [employer], if needed.  Make sure you are up to date on vaccines. During your trip:  Do not eat uncooked foods.  Do not eat food from buffets or food that is cold or sitting at room temperature.  Drink bottled beverages and water. Do not use ice.  Wash fruits and vegetables with clean water. If  possible, peel them before eating.  Do not drink unpasteurized milk.  Wear insect repellent if there are mosquitoes or other biting insects. Ask your doctor which repellents are safe. What do I need to know about traveling by car?  Wear your seat belt properly. The belt should be buckled below your abdomen, on your hip bones. The shoulder belt should be off to the side of your abdomen and across the center of your chest.  If you are in the front seat, sit as far away from the dashboard as possible to avoid getting hit hard if the airbag deploys in an accident.  Do not travel for more than 5-6 hours a day. What do I need to know about traveling by bus?  Before making a reservation, ask whether your bus will have a restroom.  Move your arms and legs when seated.  If you have to use the restroom, hold on to the seats and handrails as you walk.  Do not travel for more than 5-6 hours a day. What do I need to know about traveling by train?  Before making a reservation, ask if your train will have a sleeping car and more than one restroom.  If you need to walk while the train is moving, hold on to seats and handrails.  Move your arms and legs when seated.  Do not travel for more than 5-6 hours a day. What do I need to know about traveling  by airplane?  Before booking your trip, ask about the airline's rules about pregnancy. Pregnant women may be restricted from flying after a certain time of the pregnancy. Every airline has its own rules.  Make sure you complete your trip before 36 weeks of pregnancy.  Ask whether the airplane cabin will be pressurized. Do not board an unpressurized plane that will fly above 7,000 ft (2,100 m).  Try to get a bulkhead or an aisle seat so it is easier to get up, stretch, and use the bathroom.  Wear layers since the cabin temperature can change.  Put all your medicines and medical records in your carry-on bag.  Avoid drinking caffeinated or  carbonated beverages.  Avoid eating foods that may make you bloated.  Do not eat a big meal.  If you need to walk through the airplane, hold on to the seats and handrails.  Move your arms and legs when seated.  Wear your seat belt. What do I need to know about traveling by cruise ship?  Before booking your trip, ask the cruise ship company: ? Are pregnant women allowed on the ship? ? Is there a medical facility and doctor on board? ? Does the ship dock in places where there are doctors and medical facilities?  Before booking your trip, ask your doctor: ? Is it safe to take medicines if I get seasick? ? Is it safe to wear acupressure wristbands to prevent seasickness? If the answer is yes, consider buying one. Contact a health care provider if:  You have diarrhea.  You vomit.  You have nausea or seasickness. Get help right away if:  You have vaginal bleeding.  You have severe vomiting or diarrhea.  You have pelvic or abdominal pain.  You have contractions.  Your water breaks.  You have a persistent headache.  Your eyesight changes or you see spots.  Your face or hands are swollen.  You have pain, warmth, or swelling in your legs or ankles. Summary  Most pregnant women can safely travel until the last month of their pregnancy. Your doctor may tell you to limit or avoid travel depending on how far you are in your pregnancy and if you have any medical or pregnancy problems.  The best time to travel is between 14 and 28 weeks of your pregnancy.  Before you go on your trip, make sure you discuss your trip with your health care provider, get a copy of your medical records, and try to get information on medical centers and doctors at your destination.  While on your trip, make sure you wear comfortable clothes and shoes, eat a healthy diet, drink plenty of fluids, take your vitamins and supplements, take breaks and rest often, and wear your seat belt.  Before booking  a flight, ask about the airline's rules about pregnancy. Every airline has its own rules. Do the same for a train, bus, or cruise ship. This information is not intended to replace advice given to you by your health care provider. Make sure you discuss any questions you have with your health care provider. Document Revised: 05/06/2019 Document Reviewed: 12/26/2016 Elsevier Patient Education  2020 [employer]   Fetal Movement Counts Patient Name: ________________________________________________ Patient Due Date: ____________________ What is a fetal movement count?  A fetal movement count is the number of times that you feel your baby move during a certain amount of time. This may also be called a fetal kick count. A fetal movement count is recommended for every  pregnant woman. You may be asked to start counting fetal movements as early as week 28 of your pregnancy. °Pay attention to when your baby is most active. You may notice your baby's sleep and wake cycles. You may also notice things that make your baby move more. You should do a fetal movement count: °· When your baby is normally most active. °· At the same time each day. °A good time to count movements is while you are resting, after having something to eat and drink. °How do I count fetal movements? °1. Find a quiet, comfortable area. Sit, or lie down on your side. °2. Write down the date, the start time and stop time, and the number of movements that you felt between those two times. Take this information with you to your health care visits. °3. Write down your start time when you feel the first movement. °4. Count kicks, flutters, swishes, rolls, and jabs. You should feel at least 10 movements. °5. You may stop counting after you have felt 10 movements, or if you have been counting for 2 hours. Write down the stop time. °6. If you do not feel 10 movements in 2 hours, contact your health care provider for further instructions. Your health care  provider may want to do additional tests to assess your baby's well-being. °Contact a health care provider if: °· You feel fewer than 10 movements in 2 hours. °· Your baby is not moving like he or she usually does. °Date: ____________ Start time: ____________ Stop time: ____________ Movements: ____________ °Date: ____________ Start time: ____________ Stop time: ____________ Movements: ____________ °Date: ____________ Start time: ____________ Stop time: ____________ Movements: ____________ °Date: ____________ Start time: ____________ Stop time: ____________ Movements: ____________ °Date: ____________ Start time: ____________ Stop time: ____________ Movements: ____________ °Date: ____________ Start time: ____________ Stop time: ____________ Movements: ____________ °Date: ____________ Start time: ____________ Stop time: ____________ Movements: ____________ °Date: ____________ Start time: ____________ Stop time: ____________ Movements: ____________ °Date: ____________ Start time: ____________ Stop time: ____________ Movements: ____________ °This information is not intended to replace advice given to you by your health care provider. Make sure you discuss any questions you have with your health care provider. °Document Revised: 07/10/2019 Document Reviewed: 07/10/2019 °Elsevier Patient Education © 2020 Else[employer] Inc. ° °

## 2020-10-27 ENCOUNTER — Other Ambulatory Visit: Payer: Self-pay

## 2020-10-27 MED ORDER — METRONIDAZOLE 500 MG PO TABS: 500.0000 mg | ORAL_TABLET | Freq: Two times a day (BID) | ORAL | 0 refills | Status: DC

## 2020-11-03 DIAGNOSIS — Z419 Encounter for procedure for purposes other than remedying health state, unspecified: Secondary | ICD-10-CM | POA: Diagnosis not present

## 2020-11-08 ENCOUNTER — Ambulatory Visit (INDEPENDENT_AMBULATORY_CARE_PROVIDER_SITE_OTHER): Payer: Medicaid Other | Admitting: Certified Nurse Midwife

## 2020-11-08 ENCOUNTER — Encounter: Payer: Self-pay | Admitting: Certified Nurse Midwife

## 2020-11-08 ENCOUNTER — Other Ambulatory Visit: Payer: Self-pay

## 2020-11-08 VITALS — BP 97/63 | HR 73 | Wt 198.1 lb

## 2020-11-08 DIAGNOSIS — Z3A35 35 weeks gestation of pregnancy: Secondary | ICD-10-CM

## 2020-11-08 LAB — POCT URINALYSIS DIPSTICK OB
Bilirubin, UA: NEGATIVE
Blood, UA: NEGATIVE
Glucose, UA: NEGATIVE
Ketones, UA: NEGATIVE
Leukocytes, UA: NEGATIVE
Nitrite, UA: NEGATIVE
POC,PROTEIN,UA: NEGATIVE
Spec Grav, UA: 1.01 (ref 1.010–1.025)
Urobilinogen, UA: 0.2 E.U./dL
pH, UA: 8.5 — AB (ref 5.0–8.0)

## 2020-11-08 NOTE — Patient Instructions (Signed)
Braxton Hicks Contractions °Contractions of the uterus can occur throughout pregnancy, but they are not always a sign that you are in labor. You may have practice contractions called Braxton Hicks contractions. These false labor contractions are sometimes confused with true labor. °What are Braxton Hicks contractions? °Braxton Hicks contractions are tightening movements that occur in the muscles of the uterus before labor. Unlike true labor contractions, these contractions do not result in opening (dilation) and thinning of the cervix. Toward the end of pregnancy (32-34 weeks), Braxton Hicks contractions can happen more often and may become stronger. These contractions are sometimes difficult to tell apart from true labor because they can be very uncomfortable. You should not feel embarrassed if you go to the hospital with false labor. °Sometimes, the only way to tell if you are in true labor is for your health care provider to look for changes in the cervix. The health care provider will do a physical exam and may monitor your contractions. If you are not in true labor, the exam should show that your cervix is not dilating and your water has not broken. °If there are no other health problems associated with your pregnancy, it is completely safe for you to be sent home with false labor. You may continue to have Braxton Hicks contractions until you go into true labor. °How to tell the difference between true labor and false labor °True labor °· Contractions last 30-70 seconds. °· Contractions become very regular. °· Discomfort is usually felt in the top of the uterus, and it spreads to the lower abdomen and low back. °· Contractions do not go away with walking. °· Contractions usually become more intense and increase in frequency. °· The cervix dilates and gets thinner. °False labor °· Contractions are usually shorter and not as strong as true labor contractions. °· Contractions are usually irregular. °· Contractions  are often felt in the front of the lower abdomen and in the groin. °· Contractions may go away when you walk around or change positions while lying down. °· Contractions get weaker and are shorter-lasting as time goes on. °· The cervix usually does not dilate or become thin. °Follow these instructions at home: ° °· Take over-the-counter and prescription medicines only as told by your health care provider. °· Keep up with your usual exercises and follow other instructions from your health care provider. °· Eat and drink lightly if you think you are going into labor. °· If Braxton Hicks contractions are making you uncomfortable: °? Change your position from lying down or resting to walking, or change from walking to resting. °? Sit and rest in a tub of warm water. °? Drink enough fluid to keep your urine pale yellow. Dehydration may cause these contractions. °? Do slow and deep breathing several times an hour. °· Keep all follow-up prenatal visits as told by your health care provider. This is important. °Contact a health care provider if: °· You have a fever. °· You have continuous pain in your abdomen. °Get help right away if: °· Your contractions become stronger, more regular, and closer together. °· You have fluid leaking or gushing from your vagina. °· You pass blood-tinged mucus (bloody show). °· You have bleeding from your vagina. °· You have low back pain that you never had before. °· You feel your baby’s head pushing down and causing pelvic pressure. °· Your baby is not moving inside you as much as it used to. °Summary °· Contractions that occur before labor are   called Braxton Hicks contractions, false labor, or practice contractions. °· Braxton Hicks contractions are usually shorter, weaker, farther apart, and less regular than true labor contractions. True labor contractions usually become progressively stronger and regular, and they become more frequent. °· Manage discomfort from Braxton Hicks contractions  by changing position, resting in a warm bath, drinking plenty of water, or practicing deep breathing. °This information is not intended to replace advice given to you by your health care provider. Make sure you discuss any questions you have with your health care provider. °Document Revised: 11/02/2017 Document Reviewed: 04/05/2017 °Elsevier Patient Education © 2020 [employer] Inc. ° °

## 2020-11-08 NOTE — Progress Notes (Signed)
ROB doing well. Feels good movement. Discussed GBs next visit. She verbalizes and agrees Follow up 1 wk with Marcelino Duster.   Doreene Burke, CNM

## 2020-11-19 ENCOUNTER — Ambulatory Visit (INDEPENDENT_AMBULATORY_CARE_PROVIDER_SITE_OTHER): Payer: Medicaid Other | Admitting: Certified Nurse Midwife

## 2020-11-19 ENCOUNTER — Other Ambulatory Visit: Payer: Self-pay

## 2020-11-19 ENCOUNTER — Encounter: Payer: Self-pay | Admitting: Certified Nurse Midwife

## 2020-11-19 VITALS — BP 100/76 | HR 90 | Wt 199.0 lb

## 2020-11-19 DIAGNOSIS — Z3403 Encounter for supervision of normal first pregnancy, third trimester: Secondary | ICD-10-CM | POA: Diagnosis not present

## 2020-11-19 DIAGNOSIS — Z3A36 36 weeks gestation of pregnancy: Secondary | ICD-10-CM | POA: Diagnosis not present

## 2020-11-19 LAB — POCT URINALYSIS DIPSTICK OB
Bilirubin, UA: NEGATIVE
Blood, UA: NEGATIVE
Glucose, UA: NEGATIVE
Ketones, UA: NEGATIVE
Leukocytes, UA: NEGATIVE
Nitrite, UA: NEGATIVE
POC,PROTEIN,UA: NEGATIVE
Spec Grav, UA: 1.02 (ref 1.010–1.025)
Urobilinogen, UA: 0.2 E.U./dL
pH, UA: 6.5 (ref 5.0–8.0)

## 2020-11-19 NOTE — Progress Notes (Signed)
Pt present for routine prenatal visit. Pt c/o lower back pain.

## 2020-11-19 NOTE — Progress Notes (Signed)
ROB-Doing well, reports occasional pelvic pressure. Requests SVE.  Discussed home labor preparation. Pre-labor checklist, herbal prep guide, and birth affirmations given. 36 week cultures collected, see orders. Anticipatory guidance regarding course of prenatal care. Reviewed red flag symptoms and when to call. RTC x 1 week for ROB or sooner if needed.

## 2020-11-19 NOTE — Patient Instructions (Signed)
Vaginal Delivery  Vaginal delivery means that you give birth by pushing your baby out of your birth canal (vagina). A team of health care providers will help you before, during, and after vaginal delivery. Birth experiences are unique for every woman and every pregnancy, and birth experiences vary depending on where you choose to give birth. What happens when I arrive at the birth center or hospital? Once you are in labor and have been admitted into the hospital or birth center, your health care provider may:  Review your pregnancy history and any concerns that you have.  Insert an IV into one of your veins. This may be used to give you fluids and medicines.  Check your blood pressure, pulse, temperature, and heart rate (vital signs).  Check whether your bag of water (amniotic sac) has broken (ruptured).  Talk with you about your birth plan and discuss pain control options. Monitoring Your health care provider may monitor your contractions (uterine monitoring) and your baby's heart rate (fetal monitoring). You may need to be monitored:  Often, but not continuously (intermittently).  All the time or for long periods at a time (continuously). Continuous monitoring may be needed if: ? You are taking certain medicines, such as medicine to relieve pain or make your contractions stronger. ? You have pregnancy or labor complications. Monitoring may be done by:  Placing a special stethoscope or a handheld monitoring device on your abdomen to check your baby's heartbeat and to check for contractions.  Placing monitors on your abdomen (external monitors) to record your baby's heartbeat and the frequency and length of contractions.  Placing monitors inside your uterus through your vagina (internal monitors) to record your baby's heartbeat and the frequency, length, and strength of your contractions. Depending on the type of monitor, it may remain in your uterus or on your baby's head until  birth.  Telemetry. This is a type of continuous monitoring that can be done with external or internal monitors. Instead of having to stay in bed, you are able to move around during telemetry. Physical exam Your health care provider may perform frequent physical exams. This may include:  Checking how and where your baby is positioned in your uterus.  Checking your cervix to determine: ? Whether it is thinning out (effacing). ? Whether it is opening up (dilating). What happens during labor and delivery?  Normal labor and delivery is divided into the following three stages: Stage 1  This is the longest stage of labor.  This stage can last for hours or days.  Throughout this stage, you will feel contractions. Contractions generally feel mild, infrequent, and irregular at first. They get stronger, more frequent (about every 2-3 minutes), and more regular as you move through this stage.  This stage ends when your cervix is completely dilated to 4 inches (10 cm) and completely effaced. Stage 2  This stage starts once your cervix is completely effaced and dilated and lasts until the delivery of your baby.  This stage may last from 20 minutes to 2 hours.  This is the stage where you will feel an urge to push your baby out of your vagina.  You may feel stretching and burning pain, especially when the widest part of your baby's head passes through the vaginal opening (crowning).  Once your baby is delivered, the umbilical cord will be clamped and cut. This usually occurs after waiting a period of 1-2 minutes after delivery.  Your baby will be placed on your bare chest (  skin-to-skin contact) in an upright position and covered with a warm blanket. Watch your baby for feeding cues, like rooting or sucking, and help the baby to your breast for his or her first feeding. Stage 3  This stage starts immediately after the birth of your baby and ends after you deliver the placenta.  This stage may  take anywhere from 5 to 30 minutes.  After your baby has been delivered, you will feel contractions as your body expels the placenta and your uterus contracts to control bleeding. What can I expect after labor and delivery?  After labor is over, you and your baby will be monitored closely until you are ready to go home to ensure that you are both healthy. Your health care team will teach you how to care for yourself and your baby.  You and your baby will stay in the same room (rooming in) during your hospital stay. This will encourage early bonding and successful breastfeeding.  You may continue to receive fluids and medicines through an IV.  Your uterus will be checked and massaged regularly (fundal massage).  You will have some soreness and pain in your abdomen, vagina, and the area of skin between your vaginal opening and your anus (perineum).  If an incision was made near your vagina (episiotomy) or if you had some vaginal tearing during delivery, cold compresses may be placed on your episiotomy or your tear. This helps to reduce pain and swelling.  You may be given a squirt bottle to use instead of wiping when you go to the bathroom. To use the squirt bottle, follow these steps: ? Before you urinate, fill the squirt bottle with warm water. Do not use hot water. ? After you urinate, while you are sitting on the toilet, use the squirt bottle to rinse the area around your urethra and vaginal opening. This rinses away any urine and blood. ? Fill the squirt bottle with clean water every time you use the bathroom.  It is normal to have vaginal bleeding after delivery. Wear a sanitary pad for vaginal bleeding and discharge. Summary  Vaginal delivery means that you will give birth by pushing your baby out of your birth canal (vagina).  Your health care provider may monitor your contractions (uterine monitoring) and your baby's heart rate (fetal monitoring).  Your health care provider may  perform a physical exam.  Normal labor and delivery is divided into three stages.  After labor is over, you and your baby will be monitored closely until you are ready to go home. This information is not intended to replace advice given to you by your health care provider. Make sure you discuss any questions you have with your health care provider. Document Revised: 12/25/2017 Document Reviewed: 12/25/2017 Elsevier Patient Education  2020 [employer]    Fetal Movement Counts Patient Name: ________________________________________________ Patient Due Date: ____________________ What is a fetal movement count?  A fetal movement count is the number of times that you feel your baby move during a certain amount of time. This may also be called a fetal kick count. A fetal movement count is recommended for every pregnant woman. You may be asked to start counting fetal movements as early as week 28 of your pregnancy. Pay attention to when your baby is most active. You may notice your baby's sleep and wake cycles. You may also notice things that make your baby move more. You should do a fetal movement count:  When your baby is normally most   active.  At the same time each day. A good time to count movements is while you are resting, after having something to eat and drink. How do I count fetal movements? 1. Find a quiet, comfortable area. Sit, or lie down on your side. 2. Write down the date, the start time and stop time, and the number of movements that you felt between those two times. Take this information with you to your health care visits. 3. Write down your start time when you feel the first movement. 4. Count kicks, flutters, swishes, rolls, and jabs. You should feel at least 10 movements. 5. You may stop counting after you have felt 10 movements, or if you have been counting for 2 hours. Write down the stop time. 6. If you do not feel 10 movements in 2 hours, contact your health care  provider for further instructions. Your health care provider may want to do additional tests to assess your baby's well-being. Contact a health care provider if:  You feel fewer than 10 movements in 2 hours.  Your baby is not moving like he or she usually does. Date: ____________ Start time: ____________ Stop time: ____________ Movements: ____________ Date: ____________ Start time: ____________ Stop time: ____________ Movements: ____________ Date: ____________ Start time: ____________ Stop time: ____________ Movements: ____________ Date: ____________ Start time: ____________ Stop time: ____________ Movements: ____________ Date: ____________ Start time: ____________ Stop time: ____________ Movements: ____________ Date: ____________ Start time: ____________ Stop time: ____________ Movements: ____________ Date: ____________ Start time: ____________ Stop time: ____________ Movements: ____________ Date: ____________ Start time: ____________ Stop time: ____________ Movements: ____________ Date: ____________ Start time: ____________ Stop time: ____________ Movements: ____________ This information is not intended to replace advice given to you by your health care provider. Make sure you discuss any questions you have with your health care provider. Document Revised: 07/10/2019 Document Reviewed: 07/10/2019 Elsevier Patient Education  2020 Elsevier Inc.  

## 2020-11-21 LAB — STREP GP B NAA: Strep Gp B NAA: NEGATIVE

## 2020-11-23 LAB — GC/CHLAMYDIA PROBE AMP
Chlamydia trachomatis, NAA: NEGATIVE
Neisseria Gonorrhoeae by PCR: NEGATIVE

## 2020-11-25 NOTE — Patient Instructions (Signed)

## 2020-11-25 NOTE — Progress Notes (Signed)
ROB-Pt present routine prenatal care. Pt c/o vaginal pressure and a possible hemmorrids.

## 2020-11-29 ENCOUNTER — Ambulatory Visit (INDEPENDENT_AMBULATORY_CARE_PROVIDER_SITE_OTHER): Payer: Medicaid Other | Admitting: Certified Nurse Midwife

## 2020-11-29 ENCOUNTER — Encounter: Payer: Self-pay | Admitting: Certified Nurse Midwife

## 2020-11-29 ENCOUNTER — Other Ambulatory Visit: Payer: Self-pay

## 2020-11-29 VITALS — BP 105/75 | HR 95 | Wt 202.3 lb

## 2020-11-29 DIAGNOSIS — Z3403 Encounter for supervision of normal first pregnancy, third trimester: Secondary | ICD-10-CM

## 2020-11-29 DIAGNOSIS — Z3A38 38 weeks gestation of pregnancy: Secondary | ICD-10-CM

## 2020-11-29 LAB — POCT URINALYSIS DIPSTICK OB
Bilirubin, UA: NEGATIVE
Blood, UA: NEGATIVE
Glucose, UA: NEGATIVE
Ketones, UA: NEGATIVE
Leukocytes, UA: NEGATIVE
Nitrite, UA: NEGATIVE
POC,PROTEIN,UA: NEGATIVE
Spec Grav, UA: 1.025 (ref 1.010–1.025)
Urobilinogen, UA: 0.2 E.U./dL
pH, UA: 7 (ref 5.0–8.0)

## 2020-11-29 NOTE — Progress Notes (Signed)
ROB doing well. Feels good movement. Labor precautions reviewed. SVE per pt request. Has not started using herbal prep. Pt encouraged to start. Follow up 1 wk with Marcelino Duster for ROB.   Doreene Burke, CNM

## 2020-12-01 ENCOUNTER — Telehealth: Payer: Medicaid Other | Admitting: Emergency Medicine

## 2020-12-01 DIAGNOSIS — J069 Acute upper respiratory infection, unspecified: Secondary | ICD-10-CM | POA: Diagnosis not present

## 2020-12-01 NOTE — Progress Notes (Signed)
We are sorry you are not feeling well.  Here is how we plan to help!  Based on what you have shared with me, it looks like you may have a viral upper respiratory infection.  Upper respiratory infections are caused by a large number of viruses; however, rhinovirus is the most common cause.   Symptoms vary from person to person, with common symptoms including sore throat, cough, fatigue or lack of energy and feeling of general discomfort.  A low-grade fever of up to 100.4 may present, but is often uncommon.  Symptoms vary however, and are closely related to a person's age or underlying illnesses.  The most common symptoms associated with an upper respiratory infection are nasal discharge or congestion, cough, sneezing, headache and pressure in the ears and face.  These symptoms usually persist for about 3 to 10 days, but can last up to 2 weeks.  It is important to know that upper respiratory infections do not cause serious illness or complications in most cases.    Upper respiratory infections can be transmitted from person to person, with the most common method of transmission being a person's hands.  The virus is able to live on the skin and can infect other persons for up to 2 hours after direct contact.  Also, these can be transmitted when someone coughs or sneezes; thus, it is important to cover the mouth to reduce this risk.  To keep the spread of the illness at bay, good hand hygiene is very important.  This is an infection that is most likely caused by a virus. There are no specific treatments other than to help you with the symptoms until the infection runs its course.  We are sorry you are not feeling well.  Here is how we plan to help!  Unfortunately, there are many treatments options for upper respiratory tract infections, and there are even less in pregnancy.  For nasal congestion, you can Saline nasal spray or nasal drops can help and can safely be used as often as needed for congestion.     If you do not have a history of heart disease, hypertension, diabetes or thyroid disease, prostate/bladder issues or glaucoma, you may also use Sudafed to treat nasal congestion.  It is highly recommended that you consult with a pharmacist or your primary care physician to ensure this medication is safe for you to take.      If you have a sore or scratchy throat, use a saltwater gargle-  to  teaspoon of salt dissolved in a 4-ounce to 8-ounce glass of warm water.  Gargle the solution for approximately 15-30 seconds and then spit.  It is important not to swallow the solution.  You can also use throat lozenges/cough drops and Chloraseptic spray to help with throat pain or discomfort.  Warm or cold liquids can also be helpful in relieving throat pain.  For headache, pain or general discomfort, you can use Ibuprofen or Tylenol as directed.   Some authorities believe that zinc sprays or the use of Echinacea may shorten the course of your symptoms.   HOME CARE . Only take medications as instructed by your medical team. . Be sure to drink plenty of fluids. Water is fine as well as fruit juices, sodas and electrolyte beverages. You may want to stay away from caffeine or alcohol. If you are nauseated, try taking small sips of liquids. How do you know if you are getting enough fluid? Your urine should be a pale yellow  or almost colorless. . Get rest. . Taking a steamy shower or using a humidifier may help nasal congestion and ease sore throat pain. You can place a towel over your head and breathe in the steam from hot water coming from a faucet. . Using a saline nasal spray works much the same way. . Cough drops, hard candies and sore throat lozenges may ease your cough. . Avoid close contacts especially the very young and the elderly . Cover your mouth if you cough or sneeze . Always remember to wash your hands.   GET HELP RIGHT AWAY IF: . You develop worsening fever. . If your symptoms do not  improve within 10 days . You develop yellow or green discharge from your nose over 3 days. . You have coughing fits . You develop a severe head ache or visual changes. . You develop shortness of breath, difficulty breathing or start having chest pain . Your symptoms persist after you have completed your treatment plan  MAKE SURE YOU   Understand these instructions.  Will watch your condition.  Will get help right away if you are not doing well or get worse.  Your e-visit answers were reviewed by a board certified advanced clinical practitioner to complete your personal care plan. Depending upon the condition, your plan could have included both over the counter or prescription medications. Please review your pharmacy choice. If there is a problem, you may call our nursing hot line at and have the prescription routed to another pharmacy. Your safety is important to [place]. If you have drug allergies check your prescription carefully.   You can use MyChart to ask questions about today's visit, request a non-urgent call back, or ask for a work or school excuse for 24 hours related to this e-Visit. If it has been greater than 24 hours you will need to follow up with your provider, or enter a new e-Visit to address those concerns. You will get an e-mail in the next two days asking about your experience.  I hope that your e-visit has been valuable and will speed your recovery. Thank you for using e-visits.     Approximately 5 minutes was used in reviewing the patient's chart, questionnaire, prescribing medications, and documentation.

## 2020-12-02 ENCOUNTER — Telehealth: Payer: Self-pay

## 2020-12-02 NOTE — Telephone Encounter (Signed)
Pt called in and stated that she has not been feeling well. The pt said that she took a at home covid test and it was positive . The pt went to a urgent care and the covid test was negative. The pt stated she does have symptoms. The pt also stated that she isnt  fully vaccinated. I informed the pt that she will keep her appointment but it will be a televisit and someone from the office will call her. I also told her that I will send a message to the nurse letting her know. The pt verbally understood. Please advise

## 2020-12-02 NOTE — Telephone Encounter (Signed)
Pt c/o fever and fatigue the day before, dry cough. States symptoms have improved. Pt has been taking elderberry and vitamin c. Positive fetal movement. Pt okay to take mucinex and tylenol. Has stopped taking primrose and raspberry leaf tea.. Encouraged her to correspond via mychart. Pt verbalized understanding.

## 2020-12-04 ENCOUNTER — Encounter: Payer: Self-pay | Admitting: Certified Nurse Midwife

## 2020-12-04 ENCOUNTER — Inpatient Hospital Stay
Admission: EM | Admit: 2020-12-04 | Discharge: 2020-12-07 | DRG: 805 | Disposition: A | Discharge status: Home / Self Care | Payer: Medicaid Other | Attending: Certified Nurse Midwife | Admitting: Certified Nurse Midwife

## 2020-12-04 ENCOUNTER — Other Ambulatory Visit: Payer: Self-pay

## 2020-12-04 ENCOUNTER — Inpatient Hospital Stay: Payer: Medicaid Other | Admitting: Anesthesiology

## 2020-12-04 DIAGNOSIS — O4292 Full-term premature rupture of membranes, unspecified as to length of time between rupture and onset of labor: Secondary | ICD-10-CM | POA: Diagnosis not present

## 2020-12-04 DIAGNOSIS — Z3A38 38 weeks gestation of pregnancy: Secondary | ICD-10-CM

## 2020-12-04 DIAGNOSIS — Z8616 Personal history of COVID-19: Secondary | ICD-10-CM | POA: Diagnosis present

## 2020-12-04 DIAGNOSIS — O9852 Other viral diseases complicating childbirth: Secondary | ICD-10-CM | POA: Diagnosis not present

## 2020-12-04 DIAGNOSIS — U071 COVID-19: Secondary | ICD-10-CM | POA: Diagnosis not present

## 2020-12-04 DIAGNOSIS — O09899 Supervision of other high risk pregnancies, unspecified trimester: Secondary | ICD-10-CM

## 2020-12-04 DIAGNOSIS — Z87891 Personal history of nicotine dependence: Secondary | ICD-10-CM

## 2020-12-04 DIAGNOSIS — Z283 Underimmunization status: Secondary | ICD-10-CM | POA: Diagnosis not present

## 2020-12-04 DIAGNOSIS — Z3A39 39 weeks gestation of pregnancy: Secondary | ICD-10-CM | POA: Diagnosis not present

## 2020-12-04 DIAGNOSIS — O26893 Other specified pregnancy related conditions, third trimester: Secondary | ICD-10-CM | POA: Diagnosis not present

## 2020-12-04 DIAGNOSIS — Z419 Encounter for procedure for purposes other than remedying health state, unspecified: Secondary | ICD-10-CM | POA: Diagnosis not present

## 2020-12-04 DIAGNOSIS — O4202 Full-term premature rupture of membranes, onset of labor within 24 hours of rupture: Secondary | ICD-10-CM | POA: Diagnosis not present

## 2020-12-04 DIAGNOSIS — Z23 Encounter for immunization: Secondary | ICD-10-CM

## 2020-12-04 LAB — URINE DRUG SCREEN, QUALITATIVE (ARMC ONLY)
Amphetamines, Ur Screen: NOT DETECTED
Barbiturates, Ur Screen: NOT DETECTED
Benzodiazepine, Ur Scrn: NOT DETECTED
Cannabinoid 50 Ng, Ur ~~LOC~~: POSITIVE — AB
Cocaine Metabolite,Ur ~~LOC~~: NOT DETECTED
MDMA (Ecstasy)Ur Screen: NOT DETECTED
Methadone Scn, Ur: NOT DETECTED
Opiate, Ur Screen: NOT DETECTED
Phencyclidine (PCP) Ur S: NOT DETECTED
Tricyclic, Ur Screen: NOT DETECTED

## 2020-12-04 LAB — ABO/RH: ABO/RH(D): O POS

## 2020-12-04 LAB — CBC
HCT: 33.4 % — ABNORMAL LOW (ref 36.0–46.0)
Hemoglobin: 11.6 g/dL — ABNORMAL LOW (ref 12.0–15.0)
MCH: 33.2 pg (ref 26.0–34.0)
MCHC: 34.7 g/dL (ref 30.0–36.0)
MCV: 95.7 fL (ref 80.0–100.0)
Platelets: 304 10*3/uL (ref 150–400)
RBC: 3.49 MIL/uL — ABNORMAL LOW (ref 3.87–5.11)
RDW: 13.2 % (ref 11.5–15.5)
WBC: 8.3 10*3/uL (ref 4.0–10.5)
nRBC: 0 % (ref 0.0–0.2)

## 2020-12-04 LAB — TYPE AND SCREEN
ABO/RH(D): O POS
Antibody Screen: NEGATIVE

## 2020-12-04 LAB — COMPREHENSIVE METABOLIC PANEL
ALT: 19 U/L (ref 0–44)
AST: 18 U/L (ref 15–41)
Albumin: 2.6 g/dL — ABNORMAL LOW (ref 3.5–5.0)
Alkaline Phosphatase: 83 U/L (ref 38–126)
Anion gap: 10 (ref 5–15)
BUN: 8 mg/dL (ref 6–20)
CO2: 22 mmol/L (ref 22–32)
Calcium: 8.5 mg/dL — ABNORMAL LOW (ref 8.9–10.3)
Chloride: 105 mmol/L (ref 98–111)
Creatinine, Ser: 0.56 mg/dL (ref 0.44–1.00)
GFR, Estimated: >60 mL/min (ref >60)
Glucose, Bld: 93 mg/dL (ref 70–99)
Potassium: 4.1 mmol/L (ref 3.5–5.1)
Sodium: 137 mmol/L (ref 135–145)
Total Bilirubin: 0.7 mg/dL (ref 0.3–1.2)
Total Protein: 6.2 g/dL — ABNORMAL LOW (ref 6.5–8.1)

## 2020-12-04 LAB — RESP PANEL BY RT-PCR (FLU A&B, COVID) ARPGX2
Influenza A by PCR: NEGATIVE
Influenza B by PCR: NEGATIVE
SARS Coronavirus 2 by RT PCR: POSITIVE — AB

## 2020-12-04 MED ORDER — OXYTOCIN BOLUS FROM INFUSION
333.0000 mL | Freq: Once | INTRAVENOUS | Status: AC
  Administered 2020-12-05: 333 mL via INTRAVENOUS

## 2020-12-04 MED ORDER — DIPHENHYDRAMINE HCL 50 MG/ML IJ SOLN: 12.5000 mg | INTRAMUSCULAR | Status: DC | PRN

## 2020-12-04 MED ORDER — MISOPROSTOL 50MCG HALF TABLET
ORAL_TABLET | ORAL | Status: AC
  Administered 2020-12-04: 50 ug via BUCCAL
  Filled 2020-12-04: qty 1

## 2020-12-04 MED ORDER — OXYTOCIN 10 UNIT/ML IJ SOLN
INTRAMUSCULAR | Status: AC
  Filled 2020-12-04: qty 2

## 2020-12-04 MED ORDER — LACTATED RINGERS IV SOLN
500.0000 mL | Freq: Once | INTRAVENOUS | Status: AC
  Administered 2020-12-04: 500 mL via INTRAVENOUS

## 2020-12-04 MED ORDER — OXYTOCIN-SODIUM CHLORIDE 30-0.9 UT/500ML-% IV SOLN
2.5000 [IU]/h | INTRAVENOUS | Status: DC
  Administered 2020-12-05: 2.5 [IU]/h via INTRAVENOUS

## 2020-12-04 MED ORDER — SOD CITRATE-CITRIC ACID 500-334 MG/5ML PO SOLN: 30.0000 mL | ORAL | Status: DC | PRN

## 2020-12-04 MED ORDER — OXYTOCIN-SODIUM CHLORIDE 30-0.9 UT/500ML-% IV SOLN
INTRAVENOUS | Status: AC
  Administered 2020-12-04: 2 m[IU]/min via INTRAVENOUS
  Filled 2020-12-04: qty 500

## 2020-12-04 MED ORDER — LIDOCAINE HCL (PF) 1 % IJ SOLN: 30.0000 mL | INTRAMUSCULAR | Status: AC | PRN

## 2020-12-04 MED ORDER — MISOPROSTOL 25 MCG QUARTER TABLET
25.0000 ug | ORAL_TABLET | ORAL | Status: DC
Start: 2020-12-04 — End: 2020-12-04

## 2020-12-04 MED ORDER — MISOPROSTOL 200 MCG PO TABS
ORAL_TABLET | ORAL | Status: AC
  Filled 2020-12-04: qty 4

## 2020-12-04 MED ORDER — LIDOCAINE HCL (PF) 1 % IJ SOLN
INTRAMUSCULAR | Status: DC | PRN
  Administered 2020-12-04: 3 mL via SUBCUTANEOUS

## 2020-12-04 MED ORDER — BUTORPHANOL TARTRATE 1 MG/ML IJ SOLN
1.0000 mg | INTRAMUSCULAR | Status: DC | PRN
  Administered 2020-12-04 (×2): 1 mg via INTRAVENOUS
  Filled 2020-12-04 (×2): qty 1

## 2020-12-04 MED ORDER — BUPIVACAINE HCL (PF) 0.25 % IJ SOLN
INTRAMUSCULAR | Status: DC | PRN
  Administered 2020-12-04: 3 mL via EPIDURAL
  Administered 2020-12-04: 5 mL via EPIDURAL

## 2020-12-04 MED ORDER — LIDOCAINE HCL (PF) 1 % IJ SOLN
INTRAMUSCULAR | Status: AC
  Administered 2020-12-05: 30 mL via SUBCUTANEOUS
  Filled 2020-12-04: qty 30

## 2020-12-04 MED ORDER — LIDOCAINE-EPINEPHRINE (PF) 1.5 %-1:200000 IJ SOLN
INTRAMUSCULAR | Status: DC | PRN
  Administered 2020-12-04: 4 mL via EPIDURAL

## 2020-12-04 MED ORDER — PHENYLEPHRINE 40 MCG/ML (10ML) SYRINGE FOR IV PUSH (FOR BLOOD PRESSURE SUPPORT): 80.0000 ug | PREFILLED_SYRINGE | INTRAVENOUS | Status: DC | PRN

## 2020-12-04 MED ORDER — TERBUTALINE SULFATE 1 MG/ML IJ SOLN: 0.2500 mg | Freq: Once | INTRAMUSCULAR | Status: DC | PRN

## 2020-12-04 MED ORDER — EPHEDRINE 5 MG/ML INJ
INTRAVENOUS | Status: AC
  Administered 2020-12-05: 10 mg via INTRAVENOUS
  Filled 2020-12-04: qty 4

## 2020-12-04 MED ORDER — EPHEDRINE 5 MG/ML INJ
10.0000 mg | INTRAVENOUS | Status: DC | PRN
  Administered 2020-12-05: 10 mg via INTRAVENOUS
  Filled 2020-12-04: qty 4

## 2020-12-04 MED ORDER — LACTATED RINGERS IV SOLN
INTRAVENOUS | Status: DC
Start: 2020-12-04 — End: 2020-12-05

## 2020-12-04 MED ORDER — FENTANYL 2.5 MCG/ML W/ROPIVACAINE 0.15% IN NS 100 ML EPIDURAL (ARMC)
EPIDURAL | Status: AC
  Filled 2020-12-04: qty 100

## 2020-12-04 MED ORDER — OXYCODONE-ACETAMINOPHEN 5-325 MG PO TABS: 2.0000 | ORAL_TABLET | ORAL | Status: DC | PRN

## 2020-12-04 MED ORDER — AMMONIA AROMATIC IN INHA
RESPIRATORY_TRACT | Status: AC
  Filled 2020-12-04: qty 10

## 2020-12-04 MED ORDER — ZOLPIDEM TARTRATE 5 MG PO TABS: 5.0000 mg | ORAL_TABLET | Freq: Every evening | ORAL | Status: DC | PRN

## 2020-12-04 MED ORDER — OXYCODONE-ACETAMINOPHEN 5-325 MG PO TABS: 1.0000 | ORAL_TABLET | ORAL | Status: DC | PRN

## 2020-12-04 MED ORDER — ACETAMINOPHEN 325 MG PO TABS: 650.0000 mg | ORAL_TABLET | ORAL | Status: DC | PRN

## 2020-12-04 MED ORDER — EPHEDRINE 5 MG/ML INJ
10.0000 mg | INTRAVENOUS | Status: AC | PRN
  Administered 2020-12-05: 10 mg via INTRAVENOUS

## 2020-12-04 MED ORDER — ONDANSETRON HCL 4 MG/2ML IJ SOLN
4.0000 mg | Freq: Four times a day (QID) | INTRAMUSCULAR | Status: DC | PRN
  Administered 2020-12-04: 4 mg via INTRAVENOUS
  Filled 2020-12-04: qty 2

## 2020-12-04 MED ORDER — FENTANYL 2.5 MCG/ML W/ROPIVACAINE 0.15% IN NS 100 ML EPIDURAL (ARMC)
12.0000 mL/h | EPIDURAL | Status: DC
  Administered 2020-12-04 – 2020-12-05 (×2): 12 mL/h via EPIDURAL
  Filled 2020-12-04: qty 100

## 2020-12-04 MED ORDER — LACTATED RINGERS IV SOLN: 500.0000 mL | INTRAVENOUS | Status: DC | PRN

## 2020-12-04 MED ORDER — MISOPROSTOL 50MCG HALF TABLET
50.0000 ug | ORAL_TABLET | ORAL | Status: DC
  Administered 2020-12-04: 50 ug via BUCCAL
  Filled 2020-12-04 (×2): qty 1

## 2020-12-04 MED ORDER — OXYTOCIN-SODIUM CHLORIDE 30-0.9 UT/500ML-% IV SOLN
1.0000 m[IU]/min | INTRAVENOUS | Status: DC
  Filled 2020-12-04: qty 500

## 2020-12-04 NOTE — L&D Delivery Note (Signed)
Delivery Note  Care assumed from Dr. Valentino Saxon; for further details, please note. Effective maternal pushing efforts after vacuum assisted to crowning of fetal head.   Spontaneous vaginal birth of liveborn female infant in left occiput transverse position followed by right arm over intact perineum at 1416. Infant immediately to maternal abdomen. Delayed cord clamping. Skin to skin and three (3) vessel cord. Tube of cord blood collected. Receiving nurse and Neonatologist at bedside. AGPARs: 4, 9. Weight: pending.   Pitocin bolus infusing, see MAR. Spontaneous delivery of intact placenta at 1420. Uterus firm. Rubra small. Vaginal and labial lacerations repaired with 3-0 vicryl rapide under epidural and local anesthesia. Lacerations well approximated and hemostatic. EBL: 350 ml. Vault check completed. Counts correct x 2.   Initiate routine postpartum care and orders. Mom to postpartum.  Baby to Couplet care / Skin to Skin.  FOB present at bedside and overjoyed with the birth of "Danielle Gillespie".    Danielle Gillespie, CNM [facility], [facility] 12/05/2020, 2:50 PM

## 2020-12-04 NOTE — Anesthesia Preprocedure Evaluation (Signed)
Anesthesia Evaluation  Patient identified by MRN, date of birth, ID band Patient awake    Reviewed: Allergy & Precautions, H&P , NPO status , Patient's Chart, lab work & pertinent test results, reviewed documented beta blocker date and time   Airway Mallampati: II  TM Distance: >3 FB Neck ROM: full    Dental no notable dental hx. (+) Teeth Intact   Pulmonary neg pulmonary ROS, Current Smoker, former smoker,    Pulmonary exam normal breath sounds clear to auscultation       Cardiovascular Exercise Tolerance: Good negative cardio ROS   Rhythm:regular Rate:Normal     Neuro/Psych negative neurological ROS  negative psych ROS   GI/Hepatic Neg liver ROS, GERD  ,  Endo/Other  diabetesMorbid obesity  Renal/GU      Musculoskeletal   Abdominal   Peds  Hematology negative hematology ROS (+)   Anesthesia Other Findings   Reproductive/Obstetrics (+) Pregnancy                             Anesthesia Physical Anesthesia Plan  ASA: III  Anesthesia Plan: Epidural   Post-op Pain Management:    Induction:   PONV Risk Score and Plan:   Airway Management Planned:   Additional Equipment:   Intra-op Plan:   Post-operative Plan:   Informed Consent: I have reviewed the patients History and Physical, chart, labs and discussed the procedure including the risks, benefits and alternatives for the proposed anesthesia with the patient or authorized representative who has indicated his/her understanding and acceptance.       Plan Discussed with:   Anesthesia Plan Comments:         Anesthesia Quick Evaluation

## 2020-12-04 NOTE — H&P (Signed)
Obstetric History and Physical  Danielle Gillespie is a 30 y.o. G1P0 with IUP at [redacted]w[redacted]d presenting with leakage of clear fluid since 0800.   Patient states she has been having  none contractions, none vaginal bleeding, ruptured, clear fluid membranes, with active fetal movement.    Reports coughing and chills with congestion and positive home test four (4) days ago. Seen in Urgent Care and reports negative test.   Denies difficulty breathing or respiratory distress, chest pain, abdominal pain, vaginal bleeding, dysuria, and leg pain or swelling.   Prenatal Course  Source of Care: [facility]-initial visit at 8 weeks, total visits: 11  Pregnancy complications or risks:  Patient Active Problem List   Diagnosis Date Noted  . Lab test positive for detection of COVID-19 virus 12/04/2020  . Full-term premature rupture of membranes 12/04/2020  . Gastroesophageal reflux in pregnancy 09/16/2020  . Marijuana use 05/13/2020  . Susceptible to varicella (non-immune), currently pregnant 05/10/2020  . Rubella non-immune status, antepartum 05/10/2020    Prenatal labs and studies:  ABO, Rh: O/Positive/-- 2023-05-18 1613)  Antibody: Negative May 18, 2023 1613)  Varicella: <135 05-18-2023 1613)  Rubella: <0.90 2023/05/18 1613)  RPR: Non Reactive (10/14 0939)   HBsAg: Negative 2023/05/18 1613)   HIV: Non Reactive May 18, 2023 1613)   MWU:XLKGMWNU/-- (12/17 1452)  1 hr Glucola: 122 (10/14 0939)  Genetic screening: Low risk female (06/25 1351)  Anatomy US: Complete, normal (09/28 0857)  No past medical history on file.  Past Surgical History:  Procedure Laterality Date  . NO PAST SURGERIES      OB History  Gravida Para Term Preterm AB Living  1            SAB IAB Ectopic Multiple Live Births               # Outcome Date GA Lbr Len/2nd Weight Sex Delivery Anes PTL Lv  1 Current             Social History   Socioeconomic History  . Marital status: Single    Spouse name: Not on file  . Number of children:  Not on file  . Years of education: Not on file  . Highest education level: Not on file  Occupational History  . Not on file  Tobacco Use  . Smoking status: Former Smoker    Packs/day: 1.00    Types: Cigarettes    Quit date: 04/05/2020    Years since quitting: 0.6  . Smokeless tobacco: Never Used  . Tobacco comment: 09/16/20 pt states she smoked half a cigarrete yesterday.  Vaping Use  . Vaping Use: Former  . Substances: Nicotine, Flavoring  Substance and Sexual Activity  . Alcohol use: No  . Drug use: Not Currently  . Sexual activity: Yes    Partners: Male  Other Topics Concern  . Not on file  Social History Narrative  . Not on file   Social Determinants of Health   Financial Resource Strain: Not on file  Food Insecurity: Not on file  Transportation Needs: Not on file  Physical Activity: Not on file  Stress: Not on file  Social Connections: Not on file    Family History  Problem Relation Age of Onset  . Diabetes Mother   . Diabetes Maternal Grandmother     Medications Prior to Admission  Medication Sig Dispense Refill Last Dose  . famotidine (PEPCID) 20 MG tablet Take 1 tablet (20 mg total) by mouth 2 (two) times daily. 60 tablet 3 12/03/2020 at  Unknown time  . fluticasone (FLONASE) 50 MCG/ACT nasal spray Place 2 sprays into both nostrils daily. 16 g 6 12/03/2020 at Unknown time  . Prenatal Vit-Fe Fumarate-FA (PRENATAL MULTIVITAMIN) TABS tablet Take 1 tablet by mouth daily at 12 noon.   12/03/2020 at Unknown time    No Known Allergies  Review of Systems: Negative except for what is mentioned in HPI.  Physical Exam:  Temp:  [98.2 F (36.8 C)] 98.2 F (36.8 C) (01/01 0936) Resp:  [16] 16 (01/01 0936) Weight:  [91.6 kg] 91.6 kg (01/01 0940)  GENERAL: Well-developed, well-nourished female in no acute distress.   LUNGS: Clear to auscultation bilaterally.   HEART: Regular rate and rhythm.  ABDOMEN: Soft, nontender, nondistended, gravid.  EXTREMITIES:  Nontender, no edema, 2+ distal pulses.  Cervical Exam: Dilation: 1.5 Effacement (%): 60 Cervical Position: Middle  FHT Category I  Contractions: Rare, soft rest tone   Pertinent Labs/Studies:   Results for orders placed or performed during the hospital encounter of 12/04/20 (from the past 24 hour(s))  Resp Panel by RT-PCR (Flu A&B, Covid) Nasopharyngeal Swab     Status: Abnormal   Collection Time: 12/04/20 10:31 AM   Specimen: Nasopharyngeal Swab; Nasopharyngeal(NP) swabs in vial transport medium  Result Value Ref Range   SARS Coronavirus 2 by RT PCR POSITIVE (A) NEGATIVE   Influenza A by PCR NEGATIVE NEGATIVE   Influenza B by PCR NEGATIVE NEGATIVE    Assessment :  Danielle Gillespie is a 30 y.o. G1P0 at [redacted]w[redacted]d being admitted for full-term premature rupture of membranes, Rh positive, GBS negative, COVID positive  FHR Category I  Plan:  Admit to birthing suites, see orders.   Induction/Augmentation as needed, per protocol. Options discussed with patient and wishes to start medication at this time.   Delivery plan: Hopeful for vaginal birth.   Dr. Valentino Saxon notified of admission and plan of care.    Gunnar Bulla, CNM [facility], [facility] 12/04/20 11:41 AM

## 2020-12-04 NOTE — Progress Notes (Signed)
Patient ID: Danielle Gillespie, female   DOB: [birth year], 30 y.o.   MRN: [id number]  Danielle Gillespie is a 30 y.o. G1P0 at [redacted]w[redacted]d by ultrasound admitted for rupture of membranes  Subjective:  Patient reports occasional contractions and abdominal cramping. Notes moderate leakage of clear, bloody fluid with position changes.   FOB at bedside for continuous labor support.   Denies difficulty breathing or respiratory distress, chest pain, dysuria, and leg pain or swelling.   Objective:  Temp:  [98.1 F (36.7 C)-98.2 F (36.8 C)] 98.2 F (36.8 C) (01/01 1709) Pulse Rate:  [79-90] 79 (01/01 1714) Resp:  [16-18] 18 (01/01 1455) BP: (110-120)/(63-91) 120/91 (01/01 1714) SpO2:  [97 %] 97 % (01/01 1251) Weight:  [91.6 kg] 91.6 kg (01/01 0940)  Fetal Wellbeing:  Category II, intermittent variable decelerations  UC:   regular, every two (2) to four (4) minutes; soft resting tone  SVE:   Dilation: 6 Effacement (%): 80 Station: -2 Exam by:: Serafina Royals, CNM  Labs: Lab Results  Component Value Date   WBC 8.3 12/04/2020   HGB 11.6 (L) 12/04/2020   HCT 33.4 (L) 12/04/2020   MCV 95.7 12/04/2020   PLT 304 12/04/2020    Assessment:  Danielle Gillespie a 30 y.o.G1P0 at [redacted]w[redacted]d admitted for full-term premature rupture of membranes, Rh positive, GBS negative, COVID positive  FHR Category II, intermittent variable decelerations  Plan:  Foley bulb sitting in vagina upon assessment. Removed without difficulty.   Discussed labor augmentation options including pitocin. Patient agrees if needed.   Encouraged position change and use of peanut ball.   Reviewed red flag symptoms and when to call.   Continue orders as written. Reassess as needed.    Serafina Royals, CNM [facility], [facility] 12/04/2020, 10:19 PM

## 2020-12-04 NOTE — Anesthesia Procedure Notes (Signed)
Epidural Patient location during procedure: OB  Staffing Performed: anesthesiologist   Preanesthetic Checklist Completed: patient identified, IV checked, site marked, risks and benefits discussed, surgical consent, monitors and equipment checked, pre-op evaluation and timeout performed  Epidural Patient position: sitting Prep: Betadine Patient monitoring: heart rate, continuous pulse ox and blood pressure Approach: midline Location: L3-L4 Injection technique: LOR saline  Needle:  Needle type: Tuohy  Needle gauge: 17 G Needle length: 9 cm and 9 Needle insertion depth: 6 cm Catheter type: closed end flexible Catheter size: 19 Gauge Catheter at skin depth: 12 cm Test dose: negative and 1.5% lidocaine with Epi 1:200 K  Assessment Sensory level: T10 Events: blood not aspirated, injection not painful, no injection resistance, no paresthesia and negative IV test  Additional Notes   Patient tolerated the insertion well without complications.-SATD -IVTD. No paresthesia. Refer to [facility] nursing for VS and dosingReason for block:procedure for pain     

## 2020-12-04 NOTE — Progress Notes (Addendum)
Patient ID: Weyman Rodney, female   DOB: Nov 01, 1991, 30 y.o.   MRN: [id number]  Lakisha Peyser is a 30 y.o. G1P0 at [redacted]w[redacted]d by ultrasound admitted for rupture of membranes  Subjective:  Patient sitting in bed, reports irregular back pain. FOB at bedside for continuous labor support.   Denies difficulty breathing or respiratory distress, chest pain, abdominal pain, vaginal bleeding, dysuria, and leg pain or swelling.   Objective:  Temp:  [98.1 F (36.7 C)-98.2 F (36.8 C)] 98.2 F (36.8 C) (01/01 1709) Pulse Rate:  [90] 90 (01/01 1455) Resp:  [16-18] 18 (01/01 1455) BP: (110-116)/(63-65) 116/63 (01/01 1455) SpO2:  [97 %] 97 % (01/01 1251) Weight:  [91.6 kg] 91.6 kg (01/01 0940)  Fetal Wellbeing:  Category I  UC:   Rare, soft resting tone  SVE:   Dilation: 1 Effacement (%): 60,70 Station: -3 Exam by:: Iverson Alamin CNM  Labs: Lab Results  Component Value Date   WBC 8.3 12/04/2020   HGB 11.6 (L) 12/04/2020   HCT 33.4 (L) 12/04/2020   MCV 95.7 12/04/2020   PLT 304 12/04/2020    Assessment:  Laurabeth Yip is a 30 y.o. G1P0 at [redacted]w[redacted]d admitted for full-term premature rupture of membranes, Rh positive, GBS negative, COVID positive  FHR Category I  Plan:  Labor augmentation options discussed with patient and significant other. Agrees to foley bulb placement along with buccal Cytotec.   Foley bulb placed using speculum without difficulty.  Encouraged [employer], position change and use of birthing and peanut balls.   Reviewed red flag symptoms and when to call.   Continue orders as written. Reassess as needed.   Update given to Dr. Valentino Saxon.    Serafina Royals, CNM [facility], [facility] 12/04/2020, 5:21 PM

## 2020-12-04 NOTE — OB Triage Note (Signed)
Pt G1P0 at [redacted]w[redacted]d with c/o LOF. Pt reports large gush of clear fluid around 8am this morning that has continued to leak since. Pt reports +FM. Denies bleeding/CTX. VSS. Monitors applied. Pt reports she home a positive at home covid test earlier this week and got retested at urgent care and it was negative. Pt reports no current symptoms.

## 2020-12-05 ENCOUNTER — Encounter: Payer: Self-pay | Admitting: Certified Nurse Midwife

## 2020-12-05 DIAGNOSIS — Z283 Underimmunization status: Secondary | ICD-10-CM | POA: Diagnosis not present

## 2020-12-05 DIAGNOSIS — Z3A38 38 weeks gestation of pregnancy: Secondary | ICD-10-CM | POA: Diagnosis not present

## 2020-12-05 DIAGNOSIS — U071 COVID-19: Secondary | ICD-10-CM | POA: Diagnosis not present

## 2020-12-05 DIAGNOSIS — O4202 Full-term premature rupture of membranes, onset of labor within 24 hours of rupture: Secondary | ICD-10-CM

## 2020-12-05 LAB — RPR: RPR Ser Ql: NONREACTIVE

## 2020-12-05 MED ORDER — BENZOCAINE-MENTHOL 20-0.5 % EX AERO: 1.0000 "application " | INHALATION_SPRAY | CUTANEOUS | Status: DC | PRN

## 2020-12-05 MED ORDER — ZOLPIDEM TARTRATE 5 MG PO TABS: 5.0000 mg | ORAL_TABLET | Freq: Every evening | ORAL | Status: DC | PRN

## 2020-12-05 MED ORDER — VARICELLA VIRUS VACCINE LIVE 1350 PFU/0.5ML IJ SUSR
0.5000 mL | INTRAMUSCULAR | Status: AC | PRN
  Administered 2020-12-06: 0.5 mL via SUBCUTANEOUS
  Filled 2020-12-05 (×2): qty 0.5

## 2020-12-05 MED ORDER — ACETAMINOPHEN 325 MG PO TABS
ORAL_TABLET | ORAL | Status: AC
  Administered 2020-12-05: 650 mg via ORAL
  Filled 2020-12-05: qty 2

## 2020-12-05 MED ORDER — ONDANSETRON HCL 4 MG PO TABS
4.0000 mg | ORAL_TABLET | ORAL | Status: DC | PRN
  Administered 2020-12-05: 4 mg via ORAL
  Filled 2020-12-05 (×2): qty 1

## 2020-12-05 MED ORDER — PRENATAL MULTIVITAMIN CH
1.0000 | ORAL_TABLET | Freq: Every day | ORAL | Status: DC
  Administered 2020-12-06 – 2020-12-07 (×2): 1 via ORAL
  Filled 2020-12-05 (×2): qty 1

## 2020-12-05 MED ORDER — MEASLES, MUMPS & RUBELLA VAC IJ SOLR
0.5000 mL | Freq: Once | INTRAMUSCULAR | Status: AC
  Administered 2020-12-06: 0.5 mL via SUBCUTANEOUS
  Filled 2020-12-05 (×2): qty 0.5

## 2020-12-05 MED ORDER — BENZOCAINE-MENTHOL 20-0.5 % EX AERO
INHALATION_SPRAY | CUTANEOUS | Status: AC
  Administered 2020-12-05: 1 via TOPICAL
  Filled 2020-12-05: qty 56

## 2020-12-05 MED ORDER — ACETAMINOPHEN 325 MG PO TABS
650.0000 mg | ORAL_TABLET | ORAL | Status: DC | PRN
  Administered 2020-12-05 – 2020-12-07 (×7): 650 mg via ORAL
  Filled 2020-12-05 (×7): qty 2

## 2020-12-05 MED ORDER — IBUPROFEN 600 MG PO TABS: 600.0000 mg | ORAL_TABLET | Freq: Four times a day (QID) | ORAL | Status: DC

## 2020-12-05 MED ORDER — DIBUCAINE (PERIANAL) 1 % EX OINT
1.0000 "application " | TOPICAL_OINTMENT | CUTANEOUS | Status: DC | PRN
  Administered 2020-12-05: 1 via RECTAL
  Filled 2020-12-05: qty 28

## 2020-12-05 MED ORDER — FAMOTIDINE 20 MG PO TABS
20.0000 mg | ORAL_TABLET | Freq: Two times a day (BID) | ORAL | Status: DC
  Administered 2020-12-06 – 2020-12-07 (×4): 20 mg via ORAL
  Filled 2020-12-05 (×4): qty 1

## 2020-12-05 MED ORDER — IBUPROFEN 600 MG PO TABS
ORAL_TABLET | ORAL | Status: AC
  Administered 2020-12-05: 600 mg via ORAL
  Filled 2020-12-05: qty 1

## 2020-12-05 MED ORDER — SIMETHICONE 80 MG PO CHEW
80.0000 mg | CHEWABLE_TABLET | ORAL | Status: DC | PRN
  Administered 2020-12-06 – 2020-12-07 (×2): 80 mg via ORAL
  Filled 2020-12-05 (×2): qty 1

## 2020-12-05 MED ORDER — DIPHENHYDRAMINE HCL 25 MG PO CAPS: 25.0000 mg | ORAL_CAPSULE | Freq: Four times a day (QID) | ORAL | Status: DC | PRN

## 2020-12-05 MED ORDER — ONDANSETRON HCL 4 MG/2ML IJ SOLN: 4.0000 mg | INTRAMUSCULAR | Status: DC | PRN

## 2020-12-05 MED ORDER — IBUPROFEN 600 MG PO TABS
600.0000 mg | ORAL_TABLET | Freq: Four times a day (QID) | ORAL | Status: DC
  Administered 2020-12-05: 600 mg via ORAL
  Filled 2020-12-05 (×3): qty 1

## 2020-12-05 MED ORDER — WITCH HAZEL-GLYCERIN EX PADS
1.0000 "application " | MEDICATED_PAD | CUTANEOUS | Status: DC | PRN
  Administered 2020-12-05: 1 via TOPICAL
  Filled 2020-12-05 (×2): qty 100

## 2020-12-05 MED ORDER — SENNOSIDES-DOCUSATE SODIUM 8.6-50 MG PO TABS
2.0000 | ORAL_TABLET | Freq: Every day | ORAL | Status: DC
  Administered 2020-12-06 – 2020-12-07 (×2): 2 via ORAL
  Filled 2020-12-05 (×2): qty 2

## 2020-12-05 MED ORDER — COCONUT OIL OIL
1.0000 "application " | TOPICAL_OIL | Status: DC | PRN
  Administered 2020-12-05 – 2020-12-06 (×2): 1 via TOPICAL
  Filled 2020-12-05 (×2): qty 120

## 2020-12-05 NOTE — Progress Notes (Signed)
Willodean Rosenthal, CNM notified by phone of patients cervical exam (unable to reduce cervix) and patient is resting not feeling any pressure.  CNM acknowledged report. Will prepare for delivery.

## 2020-12-05 NOTE — Progress Notes (Signed)
Danielle Gillespie is a 30 y.o. G1P0 at [redacted]w[redacted]d by ultrasound admitted for rupture of membranes  Subjective:  Patient reports increased pelvic pressure and urge to push with contractions. States she is "tired".   Denies difficulty breathing or respiratory distress, chest pain, dysuria, and leg pain.   FOB at bedside for continuous labor support.   Objective:  Temp:  [97.3 F (36.3 C)-98.5 F (36.9 C)] 98 F (36.7 C) (01/02 0910) Pulse Rate:  [63-125] 86 (01/02 1040) Resp:  [18] 18 (01/01 1455) BP: (69-145)/(36-91) 125/75 (01/02 1040) SpO2:  [97 %-100 %] 99 % (01/02 1040)  FHR Category II, variable decelerations  UC:   regular, every two (2) to three (3) minutes; pitocin infusing 18 mu/min, soft resting  SVE:   Dilation: 10 Effacement (%): 100 Station: Plus 1,Plus 2 Exam by:: JKL  Labs: Lab Results  Component Value Date   WBC 8.3 12/04/2020   HGB 11.6 (L) 12/04/2020   HCT 33.4 (L) 12/04/2020   MCV 95.7 12/04/2020   PLT 304 12/04/2020    Assessment:  Danielle Mooreis a 30 y.o.G1P0 at [redacted]w[redacted]d admitted for full-term premature rupture of membranes, Rh positive, GBS negative, COVID positive  FHR CategoryII, intermittent variable decelerations  Plan:  Limited fetal descent noted with one (1) hour of pushing and attempts to manual rotated fetal head with two (2) fingers. Discussed suspected occiput posterior position.   Offered continued pushing or evaluation by back up MD. Patient aware MD may suggest vacuum assisted birth or primary cesarean section, verbalized understanding. Risks and benefits of each procedure will be discussed further with MD.   Dr Valentino Saxon notified and bedside assessment requested.   Reviewed red flag symptoms and when to call.   Patient self administered epidural bolus. Will resume pushing upon MD arrival.    Serafina Royals, CNM [facility], [facility] 12/05/2020, 12:38 PM

## 2020-12-05 NOTE — Progress Notes (Signed)
Progress Note (Attending)  Called by Serafina Royals, CNM at 12:35 pm  to assess patient. Danielle Gillespie is a 30 y.o. G1P0 at [redacted]w[redacted]d who presented yesterday with PROM, also COVID positive on admission.  Patient had a relatively uncomplicated labor course, however was noted to be complete with caput for several hours with limited descent with pushing.  Suspected malpositioning, with OP. Category II tracing noted with variable decelerations. Upon my assessment, patient noted to have caput at +2 station, however fetal head actually at - to +1 station, 100% effaced.  ROP to ROT positioning appreciated.  Several attempts were made to manually rotate the head with patient pushing. Able to rotate to between ROT to ROA, with small amount of further descent noted.  I discussed with patient on use of vacuum assistance due positioning.  Patient ok to proceed. Risks, benefits discussed.  Initially Kiwi bell vacuum placed, but difficult to maintain suction due to fetal scalp with thick hair and so was changed to the flat vacuum after 1 contraction.  Patient continued to push with great effort. 2 pop-off's occurred with the vacuum. The patient went on to have a successful vaginal delivery with vacuum assistance. Please see Cheri Guppy note for further details of delivery.    Hildred Laser, MD [facility]

## 2020-12-05 NOTE — Progress Notes (Signed)
Patient ID: Danielle Gillespie, female   DOB: 09-01-1991, 30 y.o.   MRN: [id number]  Danielle Gillespie is a 30 y.o. G1P0 at [redacted]w[redacted]d by ultrasound admitted for rupture of membranes  Subjective:  Patient resting in bed, reports pain relief since epidural placement. FOB at bedside for continuous labor support.   Denies difficulty breathing or respiratory distress, chest pain, abdominal pain, excessive vaginal bleeding, and leg pain or swelling.   Objective:  Temp:  [97.3 F (36.3 C)-98.2 F (36.8 C)] 97.3 F (36.3 C) (01/01 2241) Pulse Rate:  [63-114] 98 (01/02 0421) Resp:  [16-18] 18 (01/01 1455) BP: (69-145)/(36-91) 106/89 (01/02 0421) SpO2:  [97 %-100 %] 99 % (01/02 0431) Weight:  [91.6 kg] 91.6 kg (01/01 0940)  Fetal Wellbeing:  Category II, variable decelerations  UC:   regular, every two (2) to three (3) minutes; pitocin infusing at 14 mu/min, soft resting tone  SVE:   Dilation: 8.5 (8-9) Effacement (%): 100 Station: 0 Exam by:: D. Means, RN  Labs: Lab Results  Component Value Date   WBC 8.3 12/04/2020   HGB 11.6 (L) 12/04/2020   HCT 33.4 (L) 12/04/2020   MCV 95.7 12/04/2020   PLT 304 12/04/2020    Assessment:  Danielle Mooreis a 30 y.o.G1P0 at [redacted]w[redacted]d admitted for full-term premature rupture of membranes, Rh positive, GBS negative, COVID positive  FHR Category II, intermittent variable decelerations   Plan:  Encouraged position change and use of peanut ball.   Room prepared for second stage.   Reviewed red flag symptoms and when to call.   Continue orders as written. Reassess as needed.   Danielle Gillespie, CNM [facility], [facility] 12/05/2020, 4:35 AM

## 2020-12-05 NOTE — Progress Notes (Addendum)
Patient ID: Danielle Gillespie, female   DOB: 07/15/1991, 30 y.o.   MRN: [id number]  Delayed entry  1045 Updated given to Dr. Valentino Saxon, MD on unit and FHR tracing reviewed-FHR Category II with intermittent variables. SVE: 10/100/0, caput +1/+2 station. CNM to start pushing and will contact MD with update.    Serafina Royals, CNM [facility], [facility] 12/05/20 1:10 PM

## 2020-12-05 NOTE — Lactation Note (Signed)
This note was copied from a baby's chart. Lactation Consultation Note  Patient Name: Danielle Gillespie JIRCV'E Date: 12/05/2020 Reason for consult: Initial assessment;Mother's request;Primapara;Term;Other (Comment) (Mom was Covid (+)) Age:30 hours  Mom is Covid (+).  Mom had difficulty latching and sustaining the latch in the Birthplace.  She reports that he was at the breast for 20 minutes, but was on and off the breast during that time.  Demonstrated hand expression of colostrum.  Nipples are slightly flat at rest, but are easily compressible to achieve latch.  #20 nipple shield was taken in the room and demonstrated placement, but did not use for this breast feed.  Changed a meconium stool.  Once awake, he started demonstrating feeding cues which were pointed out to parents.  We put him to the breast in football hold with pillow support.  It took a few tries to obtain a deep enough latch for him to feel it in his mouth to begin sucking.  He maintained the latch for this feeding for 15 minutes, but actually sucked productively for about 8 minutes.  Mom could feel strong tugs when he was sucking well.  Mom was tender when we hand expressed, but denies breast or nipple pain when sucking.  Coconut oil was given and instructed in use. Hand out given on what to expect with feedings the first 4 days of life reviewing normal newborn stomach size, adequate intake and out put, supply and demand, burping, normal course of lactation and routine newborn feeding patterns.  Lactation [employer] and LLL hand out given and reviewed contact numbers, support groups and informative web sites.  Lactation name and number written on white board and encouraged to call with any questions, concerns or assistance.    Maternal Data Formula Feeding for Exclusion: No Has patient been taught Hand Expression?: Yes Does the patient have breastfeeding experience prior to this delivery?: No  Feeding Feeding Type: Breast  Fed  LATCH Score Latch: Repeated attempts needed to sustain latch, nipple held in mouth throughout feeding, stimulation needed to elicit sucking reflex.  Audible Swallowing: A few with stimulation  Type of Nipple: Everted at rest and after stimulation (May be a little flat at rest, but compressible)  Comfort (Breast/Nipple): Soft / non-tender (Was tender when hand expressed)  Hold (Positioning): Assistance needed to correctly position infant at breast and maintain latch.  LATCH Score: 7  Interventions Interventions: Breast feeding basics reviewed;Assisted with latch;Skin to skin;Breast massage;Hand express;Reverse pressure;Breast compression;Adjust position;Support pillows;Position options;Coconut oil  Lactation Tools Discussed/Used Tools: Nipple Shields;Coconut oil (Nipple shield given and instructed in use, but did not use for this feeding) Nipple shield size: 20 WIC Program: Yes   Consult Status Consult Status: Follow-up Follow-up type: Call as needed    Louis Meckel 12/05/2020, 7:58 PM

## 2020-12-05 NOTE — Progress Notes (Signed)
Dr. Pernell Dupre gave verbal order that epidural infusion can be reduced to half when patient is complete so that patient can regain some feeling if needed for pushing.

## 2020-12-06 ENCOUNTER — Encounter: Payer: Medicaid Other | Admitting: Certified Nurse Midwife

## 2020-12-06 LAB — CBC
HCT: 29.6 % — ABNORMAL LOW (ref 36.0–46.0)
Hemoglobin: 10.3 g/dL — ABNORMAL LOW (ref 12.0–15.0)
MCH: 33.9 pg (ref 26.0–34.0)
MCHC: 34.8 g/dL (ref 30.0–36.0)
MCV: 97.4 fL (ref 80.0–100.0)
Platelets: 268 10*3/uL (ref 150–400)
RBC: 3.04 MIL/uL — ABNORMAL LOW (ref 3.87–5.11)
RDW: 13.2 % (ref 11.5–15.5)
WBC: 15.8 10*3/uL — ABNORMAL HIGH (ref 4.0–10.5)
nRBC: 0 % (ref 0.0–0.2)

## 2020-12-06 MED ORDER — OXYCODONE HCL 5 MG PO TABS
5.0000 mg | ORAL_TABLET | Freq: Four times a day (QID) | ORAL | Status: DC | PRN
  Administered 2020-12-06 – 2020-12-07 (×4): 5 mg via ORAL
  Filled 2020-12-06 (×4): qty 1

## 2020-12-06 NOTE — Anesthesia Postprocedure Evaluation (Signed)
Anesthesia Post Note  Patient: Danielle Gillespie  Procedure(s) Performed: AN AD HOC LABOR EPIDURAL  Patient location during evaluation: Mother Baby Anesthesia Type: Epidural Level of consciousness: awake and alert Pain management: pain level controlled Vital Signs Assessment: post-procedure vital signs reviewed and stable Respiratory status: spontaneous breathing, nonlabored ventilation and respiratory function stable Cardiovascular status: stable Postop Assessment: no headache, no backache and epidural receding Anesthetic complications: no   No complications documented.   Last Vitals:  Vitals:   12/06/20 0054 12/06/20 0438  BP: 102/67 (!) 98/50  Pulse: 81 85  Resp: 19 18  Temp: 36.7 C 36.8 C  SpO2: 99% 100%    Last Pain:  Vitals:   12/06/20 0438  TempSrc: Oral  PainSc:                  [name]

## 2020-12-06 NOTE — Progress Notes (Signed)
Progress Note - Vaginal Delivery  Danielle Gillespie is a 30 y.o. G1P1001 now PP day 1 s/p Vaginal, Vacuum (Extractor) .   Subjective:  The patient reports no complaints, up ad lib, voiding, tolerating PO and + flatus   Objective:  Vital signs in last 24 hours: Temp:  [97.9 F (36.6 C)-98.7 F (37.1 C)] 98.3 F (36.8 C) (01/03 1408) Pulse Rate:  [77-91] 82 (01/03 1408) Resp:  [16-20] 18 (01/03 1408) BP: (98-114)/(50-78) 109/73 (01/03 1408) SpO2:  [99 %-100 %] 100 % (01/03 1408)  Physical Exam:  General: alert, cooperative, appears stated age and no distress Lochia: appropriate Uterine Fundus: firm    Data Review Recent Labs    12/04/20 1240 12/06/20 0629  HGB 11.6* 10.3*  HCT 33.4* 29.6*    Assessment/Plan: Active Problems:   Lab test positive for detection of COVID-19 virus   Full-term premature rupture of membranes   Vaginal delivery   Plan for discharge tomorrow   -- Continue routine PP care.     Doreene Burke, CNM  12/06/2020 3:30 PM

## 2020-12-06 NOTE — Lactation Note (Signed)
This note was copied from a baby's chart. Lactation Consultation Note  Patient Name: Danielle Gillespie Today's Date: 12/06/2020   Age:30 hours  Maternal Data   Danielle Gillespie is Covid (+).  Danielle Gillespie is still having difficulty latching Danielle Gillespie to the breast.  When she does get her latched, she falls asleep.  Danielle Gillespie is getting discouraged and asking for formula.  Danielle Gillespie reports not being able to get Danielle Gillespie to suck at her breast, but will take a pacifier.  Danielle Gillespie's nipples are semi-flat but can evert with compression.  Danielle Gillespie has hyperbilirubinemia and has been placed under phototherapy. The pediatrician would like for her to be supplemented 10 ml at each feeding.  Danielle Gillespie has already been using her Evenflo, but has not been able to get any volume and is getting tender.  S[employer] DEBP set up in room with instructions in warmth, massage, hand expression, pumping, collection, storage, labeling and handling of colostrum.  Danielle Gillespie expressed around 3 ml which was given along with 8 ml Similac 20 calorie formula via curved tip syringe at the breast in tip of nipple shield and along side of nipple shield.  Praised Danielle Gillespie for her commitment to provide healthy breast milk for her baby.  Coconut oil has been given with instructions in use.  Lactation number is written on white board and Danielle Gillespie has been encouraged to call with any questions, concerns or assistance.    Feeding Feeding Type: Bottle Fed - Formula Nipple Type: Slow - flow  LATCH Score                   Interventions    Lactation Tools Discussed/Used     Consult Status      Louis Meckel 12/06/2020, 9:22 PM

## 2020-12-06 NOTE — Plan of Care (Signed)
  Problem: Education: Goal: Knowledge of General Education information will improve Description: Including pain rating scale, medication(s)/side effects and non-pharmacologic comfort measures Outcome: Progressing   Problem: Health Behavior/Discharge Planning: Goal: Ability to manage health-related needs will improve Outcome: Progressing   Problem: Clinical Measurements: Goal: Ability to maintain clinical measurements within normal limits will improve Outcome: Progressing Goal: Will remain free from infection Outcome: Progressing Goal: Diagnostic test results will improve Outcome: Progressing Goal: Respiratory complications will improve Outcome: Progressing Goal: Cardiovascular complication will be avoided Outcome: Progressing   Problem: Activity: Goal: Risk for activity intolerance will decrease Outcome: Progressing   Problem: Nutrition: Goal: Adequate nutrition will be maintained Outcome: Progressing   Problem: Coping: Goal: Level of anxiety will decrease Outcome: Progressing   Problem: Elimination: Goal: Will not experience complications related to bowel motility Outcome: Progressing Goal: Will not experience complications related to urinary retention Outcome: Progressing   Problem: Pain Managment: Goal: General experience of comfort will improve Outcome: Progressing   Problem: Safety: Goal: Ability to remain free from injury will improve Outcome: Progressing   Problem: Skin Integrity: Goal: Risk for impaired skin integrity will decrease Outcome: Progressing   Problem: Education: Goal: Knowledge of condition will improve Outcome: Progressing Goal: Individualized Educational Video(s) Outcome: Progressing Goal: Individualized Newborn Educational Video(s) Outcome: Progressing   Problem: Activity: Goal: Will verbalize the importance of balancing activity with adequate rest periods Outcome: Progressing Goal: Ability to tolerate increased activity will  improve Outcome: Progressing   Problem: Coping: Goal: Ability to identify and utilize available resources and services will improve Outcome: Progressing   Problem: Life Cycle: Goal: Chance of risk for complications during the postpartum period will decrease Outcome: Progressing   Problem: Role Relationship: Goal: Ability to demonstrate positive interaction with newborn will improve Outcome: Progressing   Problem: Skin Integrity: Goal: Demonstration of wound healing without infection will improve Outcome: Progressing   Problem: Education: Goal: Knowledge of risk factors and measures for prevention of condition will improve Outcome: Progressing   Problem: Coping: Goal: Psychosocial and spiritual needs will be supported Outcome: Progressing   Problem: Respiratory: Goal: Will maintain a patent airway Outcome: Progressing Goal: Complications related to the disease process, condition or treatment will be avoided or minimized Outcome: Progressing

## 2020-12-07 MED ORDER — ACETAMINOPHEN 325 MG PO TABS: 650.0000 mg | ORAL_TABLET | ORAL | 0 refills | Status: DC | PRN

## 2020-12-07 MED ORDER — NORETHINDRONE 0.35 MG PO TABS: 1.0000 | ORAL_TABLET | Freq: Every day | ORAL | 11 refills | Status: DC

## 2020-12-07 NOTE — TOC Initial Note (Addendum)
Transition of Care [facility]) - Initial/Assessment Note    Patient Details  Name: Danielle Gillespie MRN: [id number] Date of Birth: [birth year]  Transition of [facility]) CM/SW Contact:    Barrville Cellar, RN Phone Number: 12/07/2020, 1:41 PM  Clinical Narrative:                 Attempted to meet with patient who was eating breakfast. Patient was (+) Marijuana however infant was negative. No CPS report warranted. Spoke with nurse who expressed no other concerns related to mother and infant. Will attempt to follow up again prior to discharge and provide SA resources.        Patient Goals and CMS Choice        Expected Discharge Plan and Services           Expected Discharge Date: 12/07/20                                    Prior Living Arrangements/Services                       Activities of Daily Living Home Assistive Devices/Equipment: None ADL Screening (condition at time of admission) Patient's cognitive ability adequate to safely complete daily activities?: Yes Is the patient deaf or have difficulty hearing?: No Does the patient have difficulty seeing, even when wearing glasses/contacts?: No Does the patient have difficulty concentrating, remembering, or making decisions?: No Patient able to express need for assistance with ADLs?: Yes Does the patient have difficulty dressing or bathing?: No Independently performs ADLs?: Yes (appropriate for developmental age) Does the patient have difficulty walking or climbing stairs?: No Weakness of Legs: None Weakness of Arms/Hands: None  Permission Sought/Granted                  Emotional Assessment              Admission diagnosis:  Full-term premature rupture of membranes [O42.92] Patient Active Problem List   Diagnosis Date Noted  . Vaginal delivery   . Lab test positive for detection of COVID-19 virus 12/04/2020  . Full-term premature rupture of membranes 12/04/2020  . Gastroesophageal reflux in  pregnancy 09/16/2020  . Marijuana use 05/13/2020  . Susceptible to varicella (non-immune), currently pregnant 05/10/2020  . Rubella non-immune status, antepartum 05/10/2020   PCP:  Patient, No Pcp Per Pharmacy:   [id number] - [place], [place] - [address] AT [facility] [address] [place] [address] Phone: [phone] Fax: [phone]  [facility] [address] [place], [place] - [address] AT [facility] [address] [place] [address] Phone: [phone] Fax: [phone]     Social Determinants of Health (SDOH) Interventions    Readmission Risk Interventions No flowsheet data found.

## 2020-12-07 NOTE — Progress Notes (Signed)
Pt wheeled to personal vehicle with all personal belongings via staff for d/c 

## 2020-12-07 NOTE — Discharge Summary (Signed)
Patient Name: Danielle Gillespie DOB: [birth year] MRN: [id number]                            Discharge Summary  Date of Admission: 12/04/2020 Date of Discharge: 12/07/2020 Delivering Provider: Holly Bodily MICHELLE   Admitting Diagnosis: Full-term premature rupture of membranes [O42.92] at [redacted]w[redacted]d Secondary diagnosis:  Active Problems:   Lab test positive for detection of COVID-19 virus   Full-term premature rupture of membranes   Vaginal delivery Vacuum assisted vaginal delivery  Mode of Delivery: vacuum-assisted vaginal delivery              Discharge diagnosis: Term Pregnancy Delivered      Intrapartum Procedures: epidural and laceration vaginal and labial    Post partum procedures: none  Complications: vaginal and labial  laceration                     Discharge Day SOAP Note:  Progress Note - Vaginal Delivery  Danielle Gillespie is a 30 y.o. G1P1001 now PP day 2 s/p Vaginal, Vacuum (Extractor) . Delivery was complicated by infection with COVID  Subjective  The patient has the following complaints: has no unusual complaints  Pain is controlled with current medications.   Patient is urinating without difficulty.  She is ambulating well.     Objective  Vital signs: BP 110/76   Pulse 85   Temp 97.8 F (36.6 C) (Oral)   Resp 20   Ht 5\' 3"  (1.6 m)   Wt 91.6 kg   LMP 03/01/2020   SpO2 99%   Breastfeeding Unknown   BMI 35.78 kg/m   Physical Exam: Gen: NAD Fundus Fundal Tone: Firm  Lochia Amount: Small        Data Review Labs: Lab Results  Component Value Date   WBC 15.8 (H) 12/06/2020   HGB 10.3 (L) 12/06/2020   HCT 29.6 (L) 12/06/2020   MCV 97.4 12/06/2020   PLT 268 12/06/2020   CBC Latest Ref Rng & Units 12/06/2020 12/04/2020 09/16/2020  WBC 4.0 - 10.5 K/uL 15.8(H) 8.3 16.2(H)  Hemoglobin 12.0 - 15.0 g/dL 10.3(L) 11.6(L) 11.2  Hematocrit 36.0 - 46.0 % 29.6(L) 33.4(L) 32.7(L)  Platelets 150 - 400 K/uL 268 304 360   O POS Performed at [facility], [address] [place], [place]   [address] Score: Inocente Salles Postnatal Depression Scale Screening Tool 12/06/2020  I have been able to laugh and see the funny side of things. 0  I have looked forward with enjoyment to things. 0  I have blamed myself unnecessarily when things went wrong. 0  I have been anxious or worried for no good reason. 0  I have felt scared or panicky for no good reason. 0  Things have been getting on top of me. 0  I have been so unhappy that I have had difficulty sleeping. 0  I have felt sad or miserable. 0  I have been so unhappy that I have been crying. 0  The thought of harming myself has occurred to me. 0  Edinburgh Postnatal Depression Scale Total 0    Assessment/Plan  Active Problems:   Lab test positive for detection of COVID-19 virus   Full-term premature rupture of membranes   Vaginal delivery    Plan for discharge today.  Discharge Instructions: Per After Visit Summary. Activity: Advance as tolerated. Pelvic rest for 6 weeks.  Also refer to After  Visit Summary Diet: Regular Medications: Allergies as of 12/07/2020   No Known Allergies     Medication List    TAKE these medications   acetaminophen 325 MG tablet Commonly known as: Tylenol Take 2 tablets (650 mg total) by mouth every 4 (four) hours as needed (for pain scale < 4).   famotidine 20 MG tablet Commonly known as: PEPCID Take 1 tablet (20 mg total) by mouth 2 (two) times daily.   fluticasone 50 MCG/ACT nasal spray Commonly known as: FLONASE Place 2 sprays into both nostrils daily.   norethindrone 0.35 MG tablet Commonly known as: MICRONOR Take 1 tablet (0.35 mg total) by mouth daily. Start at 4 weeks postpartum   prenatal multivitamin Tabs tablet Take 1 tablet by mouth daily at 12 noon.      Outpatient follow up: 2 weeks tele visit , 6 wk ppv with Willodean Rosenthal CNM Postpartum contraception: POP , ordered  Discharged Condition: good  Discharged to:  home  Newborn Data: Disposition:under bili lights at this time. Unsure about discharge today, labs pending.  Apgars: APGAR (1 MIN): 4   APGAR (5 MINS): 9   APGAR (10 MINS):    Baby Feeding: Bottle and Breast    Doreene Burke, CNM  12/07/2020 8:03 AM

## 2020-12-07 NOTE — Lactation Note (Signed)
This note was copied from a baby's chart. Lactation Consultation Note  Patient Name: Danielle Gillespie FUXNA'T Date: 12/07/2020 Reason for consult: Follow-up assessment;1st time breastfeeding;Term;Other (Comment) (mom Covid+) Age:30 hours  LC in with RN during morning rounds. Baby has had a difficult latch since delivery, has been very sleepy and a poor feeder. Hyperbili was dx yesterday and phototherapy started, serum levels are coming down, and baby more alert today taking 70mL of formula/feeding.  When asking mom her feeding plan, mom with regret states that she would like to just bottle feed. Mom is feeling overwhelmed with breastfeeding, and declines to continue. LC validated mom's feelings and provided reassurance that it was ok to make the best decision for her and her baby, being fed and healthy is the most important thing, mom felt positive after this conversation. LC reviewed with mom if she decides to stop with breastfeeding that she may still experience breast changes: reviewed how to dry up her milk supply.  LC educated mom on formula preparation, WIC, and paced-bottle feeding technique. Encouraged her to call out with questions/concerns. LC provided praise for mom for all she has done so far in taking care of her baby.  Maternal Data Formula Feeding for Exclusion: Yes Has patient been taught Hand Expression?: Yes Does the patient have breastfeeding experience prior to this delivery?: No  Feeding Feeding Type: Bottle Fed - Formula Nipple Type: Slow - flow  LATCH Score                   Interventions Interventions: Breast feeding basics reviewed;DEBP;Hand express  Lactation Tools Discussed/Used WIC Program: Yes Date initiated:: 12/07/19   Consult Status Consult Status: Complete Date: 12/07/20 Follow-up type: Call as needed    Danford Bad 12/07/2020, 11:23 AM

## 2020-12-07 NOTE — Final Progress Note (Signed)
Discharge Day SOAP Note:  Progress Note - Vaginal Delivery  Danielle Gillespie is a 30 y.o. G1P1001 now PP day 2 s/p Vaginal, Vacuum (Extractor) . Delivery was complicated by infection with COVID  Subjective  The patient has the following complaints: has no unusual complaints  Pain is controlled with current medications.   Patient is urinating without difficulty.  She is ambulating well.     Objective  Vital signs: BP 110/76   Pulse 85   Temp 97.8 F (36.6 C) (Oral)   Resp 20   Ht 5\' 3"  (1.6 m)   Wt 91.6 kg   LMP 03/01/2020   SpO2 99%   Breastfeeding Unknown   BMI 35.78 kg/m   Physical Exam: Gen: NAD Fundus Fundal Tone: Firm  Lochia Amount: Small        Data Review Labs: Lab Results  Component Value Date   WBC 15.8 (H) 12/06/2020   HGB 10.3 (L) 12/06/2020   HCT 29.6 (L) 12/06/2020   MCV 97.4 12/06/2020   PLT 268 12/06/2020   CBC Latest Ref Rng & Units 12/06/2020 12/04/2020 09/16/2020  WBC 4.0 - 10.5 K/uL 15.8(H) 8.3 16.2(H)  Hemoglobin 12.0 - 15.0 g/dL 10.3(L) 11.6(L) 11.2  Hematocrit 36.0 - 46.0 % 29.6(L) 33.4(L) 32.7(L)  Platelets 150 - 400 K/uL 268 304 360   O POS Performed at [facility], [address] [place], [place]   [address] Score: Inocente Salles Postnatal Depression Scale Screening Tool 12/06/2020  I have been able to laugh and see the funny side of things. 0  I have looked forward with enjoyment to things. 0  I have blamed myself unnecessarily when things went wrong. 0  I have been anxious or worried for no good reason. 0  I have felt scared or panicky for no good reason. 0  Things have been getting on top of me. 0  I have been so unhappy that I have had difficulty sleeping. 0  I have felt sad or miserable. 0  I have been so unhappy that I have been crying. 0  The thought of harming myself has occurred to me. 0  Edinburgh Postnatal Depression Scale Total 0    Assessment/Plan  Active Problems:   Lab test positive for  detection of COVID-19 virus   Full-term premature rupture of membranes   Vaginal delivery    Plan for discharge today.  Discharge Instructions: Per After Visit Summary. Activity: Advance as tolerated. Pelvic rest for 6 weeks.  Also refer to After Visit Summary Diet: Regular Medications: Allergies as of 12/07/2020   No Known Allergies     Medication List    TAKE these medications   acetaminophen 325 MG tablet Commonly known as: Tylenol Take 2 tablets (650 mg total) by mouth every 4 (four) hours as needed (for pain scale < 4).   famotidine 20 MG tablet Commonly known as: PEPCID Take 1 tablet (20 mg total) by mouth 2 (two) times daily.   fluticasone 50 MCG/ACT nasal spray Commonly known as: FLONASE Place 2 sprays into both nostrils daily.   norethindrone 0.35 MG tablet Commonly known as: MICRONOR Take 1 tablet (0.35 mg total) by mouth daily. Start at 4 weeks postpartum   prenatal multivitamin Tabs tablet Take 1 tablet by mouth daily at 12 noon.      Outpatient follow up: 2 weeks tele visit , 6 wk ppv with 02/04/2021 CNM Postpartum contraception: POP , ordered  Discharged Condition: good  Discharged to: home  Newborn  Data: Disposition:under bili lights at this time. Unsure about discharge today, labs pending.  Apgars: APGAR (1 MIN): 4   APGAR (5 MINS): 9   APGAR (10 MINS):    Baby Feeding: Bottle and Breast    Doreene Burke, CNM  12/07/2020 8:03 AM

## 2020-12-07 NOTE — Progress Notes (Signed)
Discharge instructions, prescriptions, education, and appointments given and explained. Pt verbalized understanding with no further questions 

## 2020-12-22 DIAGNOSIS — R69 Illness, unspecified: Secondary | ICD-10-CM | POA: Diagnosis not present

## 2020-12-23 DIAGNOSIS — R69 Illness, unspecified: Secondary | ICD-10-CM | POA: Diagnosis not present

## 2020-12-24 ENCOUNTER — Encounter: Payer: Self-pay | Admitting: Certified Nurse Midwife

## 2020-12-24 ENCOUNTER — Ambulatory Visit (INDEPENDENT_AMBULATORY_CARE_PROVIDER_SITE_OTHER): Payer: Medicaid Other | Admitting: Certified Nurse Midwife

## 2020-12-24 ENCOUNTER — Other Ambulatory Visit: Payer: Self-pay

## 2020-12-24 DIAGNOSIS — R69 Illness, unspecified: Secondary | ICD-10-CM | POA: Diagnosis not present

## 2020-12-24 DIAGNOSIS — Z8616 Personal history of COVID-19: Secondary | ICD-10-CM

## 2020-12-24 DIAGNOSIS — Z1331 Encounter for screening for depression: Secondary | ICD-10-CM

## 2020-12-24 NOTE — Patient Instructions (Signed)

## 2020-12-24 NOTE — Progress Notes (Signed)
Virtual Visit via Telephone Note  I connected with Danielle Gillespie on 12/24/20 at  4:15 PM EST by telephone and verified that I am speaking with the correct person using two identifiers.  Location:  Patient: Danielle Gillespie (home)  Provider: Serafina Royals, CNM ([facility], [facility])   I discussed the limitations, risks, security and privacy concerns of performing an evaluation and management service by telephone and the availability of in person appointments. I also discussed with the patient that there may be a patient responsible charge related to this service. The patient expressed understanding and agreed to proceed.   History of Present Illness:  Patient called for TELEVISIT mood check.   Doing well, recovered fully from COVID.   Bleeding is light and spotting. Eating and drinking without difficulty. Constipation has resolved.   Plans OCPs as contraception, will start after in office visit.   Baby is doing well. Feeding formula.   Denies difficulty breathing or respiratory distress, chest pain, abdominal pain, excessive vaginal bleeding, dysuria, and leg pain or swelling.   Observations/Objective:  Depression screen [facility] 2/9 12/24/2020  Decreased Interest 0  Down, Depressed, Hopeless 0  PHQ - 2 Score 0  Altered sleeping 0  Tired, decreased energy 0  Change in appetite 0  Feeling bad or failure about yourself  0  Trouble concentrating 0  Moving slowly or fidgety/restless 0  Suicidal thoughts 0  PHQ-9 Score 0  Difficult doing work/chores Not difficult at all   GAD 7 : Generalized Anxiety Score 12/24/2020  Nervous, Anxious, on Edge 0  Control/stop worrying 0  Worry too much - different things 0  Trouble relaxing 0  Restless 0  Easily annoyed or irritable 0  Afraid - awful might happen 0  Total GAD 7 Score 0  Anxiety Difficulty Not difficult at all    Assessment:  Postpartum care and examination History of COVID-19 Depression screening  negative   Plan:  Routine postpartum education.   Reviewed red flag symptoms and when to call.   RTC for PPV on 01/21/2021 at 1330 or sooner if needed.   Follow Up Instructions:    I discussed the assessment and treatment plan with the patient. The patient was provided an opportunity to ask questions and all were answered. The patient agreed with the plan and demonstrated an understanding of the instructions.   The patient was advised to call back or seek an in-person evaluation if the symptoms worsen or if the condition fails to improve as anticipated.  I provided 5 minutes of non-face-to-face time during this encounter.   Serafina Royals, CNM [facility], [facility] 12/24/20 4:59 PM

## 2020-12-24 NOTE — Progress Notes (Signed)
Televisit-Mood check. Pt having televisit for 2 week postpartum mood check. Pt stated that she was doing well. PHQ-9=0. GAD-7=0.

## 2020-12-25 DIAGNOSIS — R69 Illness, unspecified: Secondary | ICD-10-CM | POA: Diagnosis not present

## 2020-12-26 DIAGNOSIS — R69 Illness, unspecified: Secondary | ICD-10-CM | POA: Diagnosis not present

## 2021-01-04 DIAGNOSIS — Z419 Encounter for procedure for purposes other than remedying health state, unspecified: Secondary | ICD-10-CM | POA: Diagnosis not present

## 2021-01-21 ENCOUNTER — Encounter: Payer: Self-pay | Admitting: Certified Nurse Midwife

## 2021-01-21 ENCOUNTER — Ambulatory Visit (INDEPENDENT_AMBULATORY_CARE_PROVIDER_SITE_OTHER): Payer: Medicaid Other | Admitting: Certified Nurse Midwife

## 2021-01-21 ENCOUNTER — Other Ambulatory Visit: Payer: Self-pay

## 2021-01-21 DIAGNOSIS — Z3009 Encounter for other general counseling and advice on contraception: Secondary | ICD-10-CM

## 2021-01-21 MED ORDER — NORGESTIMATE-ETH ESTRADIOL 0.25-35 MG-MCG PO TABS: 1.0000 | ORAL_TABLET | Freq: Every day | ORAL | 11 refills | Status: DC

## 2021-01-21 NOTE — Progress Notes (Signed)
Subjective:    Danielle Gillespie is a 30 y.o. G27P1001 female who presents for a postpartum visit. She is 6 weeks postpartum following a spontaneous vaginal delivery and vacuum, mid at 39+0 gestational weeks. Anesthesia: epidural. I have fully reviewed the prenatal and intrapartum course.   Postpartum course has been uncomplicated.   Baby's course has been uncomplicated. Baby is feeding by formula.   Bleeding no bleeding. Bowel function is normal. Bladder function is normal.   Patient is sexually active.  Contraception method is condoms.   Postpartum depression screening: negative. Score 0.    Last pap 2019-2022 and was normal per patient.  Denies difficulty breathing or respiratory distress, chest pain, abdominal pain, excessive vaginal bleeding, dysuria, and leg pain or selling.   The following portions of the patient's history were reviewed and updated as appropriate: allergies, current medications, past medical history, past surgical history and problem list.  Review of Systems  Pertinent items are noted in HPI.   Objective:   BP 116/79   Pulse 69   Ht 5\' 3"  (1.6 m)   Wt 181 lb 3.2 oz (82.2 kg)   Breastfeeding No   BMI 32.10 kg/m   General:  alert, cooperative and no distress   Breasts:  deferred, no complaints  Lungs: clear to auscultation bilaterally  Heart:  regular rate and rhythm  Abdomen: soft, nontender   Vulva: normal  Vagina: normal vagina  Cervix:  closed  Corpus: Well-involuted  Adnexa:  Non-palpable   Depression screen [facility] 2/9 01/21/2021 12/24/2020  Decreased Interest 0 0  Down, Depressed, Hopeless 0 0  PHQ - 2 Score 0 0  Altered sleeping 0 0  Tired, decreased energy 0 0  Change in appetite 0 0  Feeling bad or failure about yourself  0 0  Trouble concentrating 0 0  Moving slowly or fidgety/restless 0 0  Suicidal thoughts 0 0  PHQ-9 Score 0 0  Difficult doing work/chores - Not difficult at all   GAD 7 : Generalized Anxiety Score 01/21/2021 12/24/2020   Nervous, Anxious, on Edge 0 0  Control/stop worrying 0 0  Worry too much - different things 0 0  Trouble relaxing 0 0  Restless 0 0  Easily annoyed or irritable 0 0  Afraid - awful might happen 0 0  Total GAD 7 Score 0 0  Anxiety Difficulty - Not difficult at all        Assessment:   Postpartum exam Six (6) wks s/p vacuum assisted vaginal birth Formula feeding Depression screening Contraception counseling   Plan:   Rx Ortho Cyclen, see orders.   Encouraged routine health maintenance techniques.   Reviewed red flag symptoms and when to call.   Follow up in: 4 months for ANNUAL EXAM and PAP or earlier if needed.   12/26/2020, CNM [facility], [facility] 01/21/21 2:04 PM

## 2021-01-21 NOTE — Patient Instructions (Signed)
Norgestimate; Ethinyl Estradiol Tablets What is this medicine? NORGESTIMATE; ETHINYL ESTRADIOL (nor JES ti mate; ETH in il es tra DYE ole) is an oral contraceptive. The products combine two types of female hormones, an estrogen and a progestin. These products prevent ovulation and pregnancy. Some products are also used to treat acne in females. This medicine may be used for other purposes; ask your health care provider or pharmacist if you have questions. COMMON BRAND NAME(S): Estarylla, Mili, MONO-LINYAH, MonoNessa, Norgestimate/Ethinyl Estradiol, Ortho Tri-Cyclen, Ortho Tri-Cyclen Lo, Ortho-Cyclen, Previfem, Sprintec, Tri-Estarylla, TRI-LINYAH, Tri-Lo-Estarylla, Tri-Lo-Marzia, Tri-Lo-Mili, Tri-Lo-Sprintec, Tri-Mili, Tri-Previfem, Tri-Sprintec, Tri-VyLibra, Trinessa, Trinessa Lo, VyLibra What should I tell my health care provider before I take this medicine? They need to know if you have or ever had any of these conditions:  abnormal vaginal bleeding  blood vessel disease or blood clots  breast, cervical, endometrial, ovarian, liver, or uterine cancer  diabetes  gallbladder disease  having surgery  heart disease or recent heart attack  high blood pressure  high cholesterol or triglycerides  history of irregular heartbeat or heart valve problems  kidney disease  liver disease  migraine headaches  protein C deficiency  protein S deficiency  recently had a baby, miscarriage, or abortion  stroke  systemic lupus erythematosus (SLE)  tobacco smoker  an unusual or allergic reaction to estrogens, progestins, other medicines, foods, dyes, or preservatives  pregnant or trying to get pregnant  breast-feeding How should I use this medicine? Take this medicine by mouth. To reduce nausea, this medicine may be taken with food. Follow the directions on the prescription label. Take this medicine at the same time each day and in the order directed on the package. Do not take your  medicine more often than directed. Contact your pediatrician regarding the use of this medicine in children. Special care may be needed. This medicine has been used in female children who have started having menstrual periods. A patient package insert for the product will be given with each prescription and refill. Read this sheet carefully each time. The sheet may change frequently. Overdosage: If you think you have taken too much of this medicine contact a poison control center or emergency room at once. NOTE: This medicine is only for you. Do not share this medicine with others. What if I miss a dose? If you miss a dose, refer to the patient information sheet you received with your medication for directions on what to do. If you miss more than one dose, this medication may not be as effective, and you may need to use another form of birth control. What may interact with this medicine? Do not take this medicine with the following medication:  dasabuvir; ombitasvir; paritaprevir; ritonavir  ombitasvir; paritaprevir; ritonavir This medicine may also interact with the following medications:  acetaminophen  antibiotics or medicines for infections, especially rifampin, rifabutin, rifapentine, and griseofulvin, and possibly penicillins or tetracyclines  aprepitant  ascorbic acid (vitamin C)  atorvastatin  barbiturate medicines, such as phenobarbital  bosentan  carbamazepine  caffeine  clofibrate  cyclosporine  dantrolene  doxercalciferol  felbamate  grapefruit juice  hydrocortisone  medicines for anxiety or sleeping problems, such as diazepam or temazepam  medicines for diabetes, including pioglitazone  mineral oil  modafinil  mycophenolate  nefazodone  oxcarbazepine  phenytoin  prednisolone  ritonavir or other medicines for HIV infection or AIDS  rosuvastatin  selegiline  soy isoflavones supplements  St. John's wort  tamoxifen or  raloxifene  theophylline  thyroid hormones  topiramate    warfarin This list may not describe all possible interactions. Give your health care provider a list of all the medicines, herbs, non-prescription drugs, or dietary supplements you use. Also tell them if you smoke, drink alcohol, or use illegal drugs. Some items may interact with your medicine. What should I watch for while using this medicine? Visit your doctor or health care professional for regular checks on your progress. You will need a regular breast and pelvic exam and Pap smear while on this medicine. You should also discuss the need for regular mammograms with your health care professional, and follow his or her guidelines for these tests. This medicine can make your body retain fluid, making your fingers, hands, or ankles swell. Your blood pressure can go up. Contact your doctor or health care professional if you feel you are retaining fluid. Use an additional method of contraception during the first cycle that you take these tablets. If you have any reason to think you are pregnant, stop taking this medicine right away and contact your doctor or health care professional. If you are taking this medicine for hormone related problems, it may take several cycles of use to see improvement in your condition. Do not use this product if you smoke and are over 35 years of age. Smoking increases the risk of getting a blood clot or having a stroke while you are taking birth control pills, especially if you are more than 30 years old. If you are a smoker who is 35 years of age or younger, you are strongly advised not to smoke while taking birth control pills. This medicine can make you more sensitive to the sun. Keep out of the sun. If you cannot avoid being in the sun, wear protective clothing and use sunscreen. Do not use sun lamps or tanning beds/booths. If you wear contact lenses and notice visual changes, or if the lenses begin to feel  uncomfortable, consult your eye care specialist. In some women, tenderness, swelling, or minor bleeding of the gums may occur. Notify your dentist if this happens. Brushing and flossing your teeth regularly may help limit this. See your dentist regularly and inform your dentist of the medicines you are taking. If you are going to have elective surgery, you may need to stop taking this medicine before the surgery. Consult your health care professional for advice. This medicine does not protect you against HIV infection (AIDS) or any other sexually transmitted diseases. What side effects may I notice from receiving this medicine? Side effects that you should report to your doctor or health care professional as soon as possible:  allergic reactions such as skin rash or itching, hives, swelling of the lips, mouth, tongue, or throat  breast tissue changes or discharge  dark patches of skin on your forehead, cheeks, upper lip, and chin  depression  high blood pressure  migraines or severe, sudden headaches  signs and symptoms of a blood clot such as breathing problems; changes in vision; chest pain; severe, sudden headache; pain, swelling, warmth in the leg; trouble speaking; sudden numbness or weakness of the face, arm or leg  stomach pain  symptoms of vaginal infection like itching, irritation or unusual discharge  yellowing of the eyes or skin Side effects that usually do not require medical attention (report these to your doctor or health care professional if they continue or are bothersome):  breast pain, tenderness  irregular vaginal bleeding or spotting, particularly during the first month of use  mild headache  nausea    weight gain (slight) This list may not describe all possible side effects. Call your doctor for medical advice about side effects. You may report side effects to FDA at 1-800-FDA-1088. Where should I keep my medicine? Keep out of the reach of children. Store  at room temperature between 15 and 30 degrees C (59 and 86 degrees F). Throw away any unused medicine after the expiration date. NOTE: This sheet is a summary. It may not cover all possible information. If you have questions about this medicine, talk to your doctor, pharmacist, or health care provider.  2021 Elsevier/Gold Standard (2020-09-30 13:10:24)   Preventive Care 21-39 Years Old, Female Preventive care refers to lifestyle choices and visits with your health care provider that can promote health and wellness. This includes:  A yearly physical exam. This is also called an annual wellness visit.  Regular dental and eye exams.  Immunizations.  Screening for certain conditions.  Healthy lifestyle choices, such as: ? Eating a healthy diet. ? Getting regular exercise. ? Not using drugs or products that contain nicotine and tobacco. ? Limiting alcohol use. What can I expect for my preventive care visit? Physical exam Your health care provider may check your:  Height and weight. These may be used to calculate your BMI (body mass index). BMI is a measurement that tells if you are at a healthy weight.  Heart rate and blood pressure.  Body temperature.  Skin for abnormal spots. Counseling Your health care provider may ask you questions about your:  Past medical problems.  Family's medical history.  Alcohol, tobacco, and drug use.  Emotional well-being.  Home life and relationship well-being.  Sexual activity.  Diet, exercise, and sleep habits.  Work and work environment.  Access to firearms.  Method of birth control.  Menstrual cycle.  Pregnancy history. What immunizations do I need? Vaccines are usually given at various ages, according to a schedule. Your health care provider will recommend vaccines for you based on your age, medical history, and lifestyle or other factors, such as travel or where you work.   What tests do I need? Blood tests  Lipid and  cholesterol levels. These may be checked every 5 years starting at age 20.  Hepatitis C test.  Hepatitis B test. Screening  Diabetes screening. This is done by checking your blood sugar (glucose) after you have not eaten for a while (fasting).  STD (sexually transmitted disease) testing, if you are at risk.  BRCA-related cancer screening. This may be done if you have a family history of breast, ovarian, tubal, or peritoneal cancers.  Pelvic exam and Pap test. This may be done every 3 years starting at age 21. Starting at age 30, this may be done every 5 years if you have a Pap test in combination with an HPV test. Talk with your health care provider about your test results, treatment options, and if necessary, the need for more tests.   Follow these instructions at home: Eating and drinking  Eat a healthy diet that includes fresh fruits and vegetables, whole grains, lean protein, and low-fat dairy products.  Take vitamin and mineral supplements as recommended by your health care provider.  Do not drink alcohol if: ? Your health care provider tells you not to drink. ? You are pregnant, may be pregnant, or are planning to become pregnant.  If you drink alcohol: ? Limit how much you have to 0-1 drink a day. ? Be aware of how much alcohol is in your drink.   In the [place] one drink equals one 12 oz bottle of beer (355 mL), one 5 oz glass of wine (148 mL), or one 1 oz glass of hard liquor (44 mL).   Lifestyle  Take daily care of your teeth and gums. Brush your teeth every morning and night with fluoride toothpaste. Floss one time each day.  Stay active. Exercise for at least 30 minutes 5 or more days each week.  Do not use any products that contain nicotine or tobacco, such as cigarettes, e-cigarettes, and chewing tobacco. If you need help quitting, ask your health care provider.  Do not use drugs.  If you are sexually active, practice safe sex. Use a condom or other form of  protection to prevent STIs (sexually transmitted infections).  If you do not wish to become pregnant, use a form of birth control. If you plan to become pregnant, see your health care provider for a prepregnancy visit.  Find healthy ways to cope with stress, such as: ? Meditation, yoga, or listening to music. ? Journaling. ? Talking to a trusted person. ? Spending time with friends and family. Safety  Always wear your seat belt while driving or riding in a vehicle.  Do not drive: ? If you have been drinking alcohol. Do not ride with someone who has been drinking. ? When you are tired or distracted. ? While texting.  Wear a helmet and other protective equipment during sports activities.  If you have firearms in your house, make sure you follow all gun safety procedures.  Seek help if you have been physically or sexually abused. What's next?  Go to your health care provider once a year for an annual wellness visit.  Ask your health care provider how often you should have your eyes and teeth checked.  Stay up to date on all vaccines. This information is not intended to replace advice given to you by your health care provider. Make sure you discuss any questions you have with your health care provider. Document Revised: 07/18/2020 Document Reviewed: 08/01/2018 Elsevier Patient Education  2021 [employer].

## 2021-01-21 NOTE — Progress Notes (Signed)
   PT is present today for her postpartum visit. Pt stated that she is not breastfeeding and have had sexually intercourse recently and used protection. Pt has a rx for OCP but is not using it right now due to lack of breast production.  GAD-7=0; PHQ-9=0.Marland Kitchen  Pt stated that she is doing well no complaints.

## 2021-01-22 ENCOUNTER — Other Ambulatory Visit: Payer: Self-pay | Admitting: Certified Nurse Midwife

## 2021-02-01 DIAGNOSIS — Z419 Encounter for procedure for purposes other than remedying health state, unspecified: Secondary | ICD-10-CM | POA: Diagnosis not present

## 2021-03-04 DIAGNOSIS — Z419 Encounter for procedure for purposes other than remedying health state, unspecified: Secondary | ICD-10-CM | POA: Diagnosis not present

## 2021-04-03 DIAGNOSIS — Z419 Encounter for procedure for purposes other than remedying health state, unspecified: Secondary | ICD-10-CM | POA: Diagnosis not present

## 2021-04-23 ENCOUNTER — Encounter: Payer: Self-pay | Admitting: Nurse Practitioner

## 2021-04-23 DIAGNOSIS — E538 Deficiency of other specified B group vitamins: Secondary | ICD-10-CM | POA: Insufficient documentation

## 2021-04-23 DIAGNOSIS — D649 Anemia, unspecified: Secondary | ICD-10-CM | POA: Insufficient documentation

## 2021-04-27 ENCOUNTER — Ambulatory Visit (INDEPENDENT_AMBULATORY_CARE_PROVIDER_SITE_OTHER): Payer: Medicaid Other | Admitting: Nurse Practitioner

## 2021-04-27 ENCOUNTER — Encounter: Payer: Self-pay | Admitting: Nurse Practitioner

## 2021-04-27 ENCOUNTER — Other Ambulatory Visit: Payer: Self-pay

## 2021-04-27 VITALS — BP 109/70 | HR 84 | Temp 98.6°F | Ht 64.5 in | Wt 186.8 lb

## 2021-04-27 DIAGNOSIS — E559 Vitamin D deficiency, unspecified: Secondary | ICD-10-CM

## 2021-04-27 DIAGNOSIS — Z1159 Encounter for screening for other viral diseases: Secondary | ICD-10-CM

## 2021-04-27 DIAGNOSIS — Z7289 Other problems related to lifestyle: Secondary | ICD-10-CM | POA: Diagnosis not present

## 2021-04-27 DIAGNOSIS — D649 Anemia, unspecified: Secondary | ICD-10-CM | POA: Diagnosis not present

## 2021-04-27 DIAGNOSIS — F1290 Cannabis use, unspecified, uncomplicated: Secondary | ICD-10-CM

## 2021-04-27 DIAGNOSIS — Z8249 Family history of ischemic heart disease and other diseases of the circulatory system: Secondary | ICD-10-CM | POA: Diagnosis not present

## 2021-04-27 DIAGNOSIS — F53 Postpartum depression: Secondary | ICD-10-CM | POA: Diagnosis not present

## 2021-04-27 DIAGNOSIS — Z720 Tobacco use: Secondary | ICD-10-CM | POA: Insufficient documentation

## 2021-04-27 DIAGNOSIS — E6609 Other obesity due to excess calories: Secondary | ICD-10-CM | POA: Diagnosis not present

## 2021-04-27 DIAGNOSIS — O99345 Other mental disorders complicating the puerperium: Secondary | ICD-10-CM | POA: Insufficient documentation

## 2021-04-27 DIAGNOSIS — Z8659 Personal history of other mental and behavioral disorders: Secondary | ICD-10-CM | POA: Insufficient documentation

## 2021-04-27 DIAGNOSIS — E669 Obesity, unspecified: Secondary | ICD-10-CM | POA: Insufficient documentation

## 2021-04-27 DIAGNOSIS — Z6831 Body mass index (BMI) 31.0-31.9, adult: Secondary | ICD-10-CM | POA: Diagnosis not present

## 2021-04-27 DIAGNOSIS — Z7689 Persons encountering health services in other specified circumstances: Secondary | ICD-10-CM

## 2021-04-27 MED ORDER — CITALOPRAM HYDROBROMIDE 10 MG PO TABS: 10.0000 mg | ORAL_TABLET | Freq: Every day | ORAL | 3 refills | Status: DC

## 2021-04-27 NOTE — Assessment & Plan Note (Signed)
Noted on past labs with reported history of anemia -- check CBC and iron level + B12 today.  If low consider addition of iron supplement daily.  Discussed with patient.

## 2021-04-27 NOTE — Assessment & Plan Note (Signed)
No longer using, this made her more anxious and she quit.

## 2021-04-27 NOTE — Progress Notes (Addendum)
New Patient Office Visit  Subjective:  Patient ID: Danielle Gillespie, female    DOB: 03-23-1991  Age: 30 y.o. MRN: [id number]  CC:  Chief Complaint  Patient presents with  . Establish Care    Patient is here to establish care.   . Postpartum Care    Patient states she is here to discuss post partum anxiety. Patient states she gave birth on January 2nd.     HPI Danielle Gillespie presents for new patient visit to establish care.  Introduced to Publishing rights manager role and practice setting.  All questions answered.  Discussed provider/patient relationship and expectations.  Has appointment for June 14th at [facility].  Is going to [facility] upcoming for weight loss assist.  Does vape daily -- former cigarette smoker, quit about one year ago.  ANXIETY/STRESS Had 1st child January 2nd.  Struggling at this time with postpartum depression and anxiety -- her mother and maternal grandmother + sister all struggle with this.  Two of them take medication she is not sure what.  She started to notice mood changes almost immediately after having her.  No current therapy, is interested. Duration:uncontrolled Anxious mood: yes  Excessive worrying: yes Irritability: no  Sweating: yes Nausea: no Palpitations:yes Hyperventilation: no Panic attacks: no Agoraphobia: no  Obscessions/compulsions: no Depressed mood: yes Depression screen [facility] 2/9 04/27/2021 01/21/2021 12/24/2020  Decreased Interest 2 0 0  Down, Depressed, Hopeless 2 0 0  PHQ - 2 Score 4 0 0  Altered sleeping 3 0 0  Tired, decreased energy 3 0 0  Change in appetite 3 0 0  Feeling bad or failure about yourself  3 0 0  Trouble concentrating 2 0 0  Moving slowly or fidgety/restless 2 0 0  Suicidal thoughts 0 0 0  PHQ-9 Score 20 0 0  Difficult doing work/chores Very difficult - Not difficult at all   Anhedonia: no Weight changes: yes Insomnia: yes hard to stay asleep  Hypersomnia: none Fatigue/loss of energy: yes Feelings of  worthlessness: yes Feelings of guilt: yes Impaired concentration/indecisiveness: yes Suicidal ideations: no  Crying spells: yes Recent Stressors/Life Changes: yes -- having a new baby   Relationship problems: no   Family stress: no     Financial stress: no    Job stress: no    Recent death/loss: no GAD 7 : Generalized Anxiety Score 04/27/2021 01/21/2021 12/24/2020  Nervous, Anxious, on Edge 3 0 0  Control/stop worrying 3 0 0  Worry too much - different things 3 0 0  Trouble relaxing 3 0 0  Restless 1 0 0  Easily annoyed or irritable 2 0 0  Afraid - awful might happen 3 0 0  Total GAD 7 Score 18 0 0  Anxiety Difficulty Very difficult - Not difficult at all     Past Medical History:  Diagnosis Date  . Anxiety   . Depression     Past Surgical History:  Procedure Laterality Date  . NO PAST SURGERIES      Family History  Problem Relation Age of Onset  . Depression Mother   . Diabetes Maternal Grandmother   . Bipolar disorder Maternal Grandmother   . Heart disease Maternal Grandmother   . Depression Maternal Grandmother   . Healthy Father   . Dementia Paternal Grandmother   . Depression Sister   . Depression Brother     Social History   Socioeconomic History  . Marital status: Single    Spouse name: Not on file  . Number  of children: Not on file  . Years of education: Not on file  . Highest education level: Not on file  Occupational History  . Not on file  Tobacco Use  . Smoking status: Former Smoker    Packs/day: 1.00    Types: Cigarettes    Quit date: 04/05/2020    Years since quitting: 1.0  . Smokeless tobacco: Never Used  . Tobacco comment: 09/16/20 pt states she smoked half a cigarrete yesterday.  Vaping Use  . Vaping Use: Former  . Substances: Nicotine, Flavoring  Substance and Sexual Activity  . Alcohol use: No  . Drug use: Not Currently  . Sexual activity: Yes    Partners: Male    Birth control/protection: Condom  Other Topics Concern  . Not on  file  Social History Narrative  . Not on file   Social Determinants of Health   Financial Resource Strain: Low Risk   . Difficulty of Paying Living Expenses: Not hard at all  Food Insecurity: No Food Insecurity  . Worried About Programme researcher, broadcasting/film/video in the Last Year: Never true  . Ran Out of Food in the Last Year: Never true  Transportation Needs: No Transportation Needs  . Lack of Transportation (Medical): No  . Lack of Transportation (Non-Medical): No  Physical Activity: Inactive  . Days of Exercise per Week: 0 days  . Minutes of Exercise per Session: 0 min  Stress: Stress Concern Present  . Feeling of Stress : To some extent  Social Connections: Moderately Isolated  . Frequency of Communication with Friends and Family: Three times a week  . Frequency of Social Gatherings with Friends and Family: Three times a week  . Attends Religious Services: Never  . Active Member of Clubs or Organizations: No  . Attends Banker Meetings: Never  . Marital Status: Living with partner  Intimate Partner Violence: Not At Risk  . Fear of Current or Ex-Partner: No  . Emotionally Abused: No  . Physically Abused: No  . Sexually Abused: No    ROS Review of Systems  Constitutional: Negative for activity change, appetite change, diaphoresis, fatigue and fever.  Respiratory: Negative for cough, chest tightness and shortness of breath.   Cardiovascular: Negative for chest pain, palpitations and leg swelling.  Gastrointestinal: Negative.   Endocrine: Negative for cold intolerance and heat intolerance.  Neurological: Negative.   Psychiatric/Behavioral: Negative.     Objective:   Today's Vitals: BP 109/70   Pulse 84   Temp 98.6 F (37 C) (Oral)   Ht 5' 4.5" (1.638 m)   Wt 186 lb 12.8 oz (84.7 kg)   SpO2 98%   BMI 31.57 kg/m   Physical Exam Vitals and nursing note reviewed.  Constitutional:      General: She is awake. She is not in acute distress.    Appearance: She is  well-developed and well-groomed. She is obese. She is not ill-appearing or toxic-appearing.  HENT:     Head: Normocephalic.     Right Ear: Hearing normal.     Left Ear: Hearing normal.  Eyes:     General: Lids are normal.        Right eye: No discharge.        Left eye: No discharge.     Conjunctiva/sclera: Conjunctivae normal.     Pupils: Pupils are equal, round, and reactive to light.  Neck:     Thyroid: No thyromegaly.     Vascular: No carotid bruit.  Cardiovascular:  Rate and Rhythm: Normal rate and regular rhythm.     Heart sounds: Normal heart sounds. No murmur heard. No gallop.   Pulmonary:     Effort: Pulmonary effort is normal. No accessory muscle usage or respiratory distress.     Breath sounds: Normal breath sounds.  Abdominal:     General: Bowel sounds are normal.     Palpations: Abdomen is soft. There is no hepatomegaly or splenomegaly.  Musculoskeletal:     Cervical back: Normal range of motion and neck supple.     Right lower leg: No edema.     Left lower leg: No edema.  Lymphadenopathy:     Cervical: No cervical adenopathy.  Skin:    General: Skin is warm and dry.  Neurological:     Mental Status: She is alert and oriented to person, place, and time.     Deep Tendon Reflexes: Reflexes are normal and symmetric.     Reflex Scores:      Brachioradialis reflexes are 2+ on the right side and 2+ on the left side.      Patellar reflexes are 2+ on the right side and 2+ on the left side. Psychiatric:        Attention and Perception: Attention normal.        Mood and Affect: Mood normal.        Speech: Speech normal.        Behavior: Behavior normal. Behavior is cooperative.        Thought Content: Thought content normal.    Assessment & Plan:   Problem List Items Addressed This Visit      Other   Marijuana use    No longer using, this made her more anxious and she quit.      Low hemoglobin    Noted on past labs with reported history of anemia -- check  CBC and iron level + B12 today.  If low consider addition of iron supplement daily.  Discussed with patient.      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  Discussed all options at length with patient: including no treatment and therapy only vs therapy and addition of SSRI, SNRI, Wellbutrin.  At this time start Celexa 10 MG daily, educated on side effects and Black Box warning for increased suicidal ideation. Denies SI/HI at this time.  Recommended virtual therapy via app like [facility], will place therapy referral too.  Return in 4 weeks for follow-up, sooner if worsening mood.        Relevant Medications   citalopram (CELEXA) 10 MG tablet   Other Relevant Orders   TSH   Ambulatory referral to Psychology   Obesity    BMI 31.57.  Recommended eating smaller high protein, low fat meals more frequently and exercising 30 mins a day 5 times a week with a goal of 10-15lb weight loss in the next 3 months. Patient voiced their understanding and motivation to adhere to these recommendations.       Vapes non-nicotine containing substance    Ongoing, previously smoked cigarettes, recommend complete cessation of vaping.       Other Visit Diagnoses    Encounter to establish care    -  Primary   Vitamin D deficiency       History of low levels, check Vit D today and start supplement  as needed.   Relevant Orders   VITAMIN D 25 Hydroxy (Vit-D Deficiency, Fractures)   Family history of cardiovascular disease       Check lipid panel today for baseline.   Relevant Orders   Comprehensive metabolic panel   Lipid Panel w/o Chol/HDL Ratio   Need for hepatitis C screening test       Hep C screening today per guideline recommendations for one time screening.   Relevant Orders   Hepatitis C antibody      Outpatient Encounter Medications as of 04/27/2021  Medication Sig  .  citalopram (CELEXA) 10 MG tablet Take 1 tablet (10 mg total) by mouth daily.  . norgestimate-ethinyl estradiol (ORTHO-CYCLEN) 0.25-35 MG-MCG tablet Take 1 tablet by mouth daily.  . [DISCONTINUED] acetaminophen (TYLENOL) 325 MG tablet Take 2 tablets (650 mg total) by mouth every 4 (four) hours as needed (for pain scale < 4). (Patient not taking: Reported on 04/27/2021)  . [DISCONTINUED] famotidine (PEPCID) 20 MG tablet TAKE 1 TABLET BY MOUTH TWICE A DAY (Patient not taking: Reported on 04/27/2021)  . [DISCONTINUED] fluticasone (FLONASE) 50 MCG/ACT nasal spray Place 2 sprays into both nostrils daily. (Patient not taking: Reported on 04/27/2021)  . [DISCONTINUED] Prenatal Vit-Fe Fumarate-FA (PRENATAL MULTIVITAMIN) TABS tablet Take 1 tablet by mouth daily at 12 noon. (Patient not taking: Reported on 04/27/2021)   No facility-administered encounter medications on file as of 04/27/2021.    Follow-up: Return in about 4 weeks (around 05/25/2021) for Depression.   Marjie SkiffJOLENE T Jack Mineau, NP

## 2021-04-27 NOTE — Addendum Note (Signed)
Addended by: Aura Dials T on: 04/27/2021 07:59 PM   Modules accepted: Orders

## 2021-04-27 NOTE — Assessment & Plan Note (Signed)
BMI 31.57.  Recommended eating smaller high protein, low fat meals more frequently and exercising 30 mins a day 5 times a week with a goal of 10-15lb weight loss in the next 3 months. Patient voiced their understanding and motivation to adhere to these recommendations.

## 2021-04-27 NOTE — Patient Instructions (Signed)

## 2021-04-27 NOTE — Assessment & Plan Note (Signed)
Ongoing, previously smoked cigarettes, recommend complete cessation of vaping.

## 2021-04-27 NOTE — Assessment & Plan Note (Addendum)
Ongoing since delivery in January.  Discussed all options at length with patient: including no treatment and therapy only vs therapy and addition of SSRI, SNRI, Wellbutrin.  At this time start Celexa 10 MG daily, educated on side effects and Black Box warning for increased suicidal ideation. Denies SI/HI at this time.  Recommended virtual therapy via app like [facility], will place therapy referral too.  Return in 4 weeks for follow-up, sooner if worsening mood.

## 2021-04-28 ENCOUNTER — Encounter: Payer: Self-pay | Admitting: Nurse Practitioner

## 2021-04-28 DIAGNOSIS — E559 Vitamin D deficiency, unspecified: Secondary | ICD-10-CM | POA: Insufficient documentation

## 2021-04-28 DIAGNOSIS — D729 Disorder of white blood cells, unspecified: Secondary | ICD-10-CM | POA: Insufficient documentation

## 2021-04-28 DIAGNOSIS — E7800 Pure hypercholesterolemia, unspecified: Secondary | ICD-10-CM | POA: Insufficient documentation

## 2021-04-28 LAB — LIPID PANEL W/O CHOL/HDL RATIO
Cholesterol, Total: 194 mg/dL (ref 100–199)
HDL: 46 mg/dL (ref >39)
LDL Chol Calc (NIH): 110 mg/dL — ABNORMAL HIGH (ref 0–99)
Triglycerides: 217 mg/dL — ABNORMAL HIGH (ref 0–149)
VLDL Cholesterol Cal: 38 mg/dL (ref 5–40)

## 2021-04-28 LAB — COMPREHENSIVE METABOLIC PANEL
ALT: 23 IU/L (ref 0–32)
AST: 17 IU/L (ref 0–40)
Albumin/Globulin Ratio: 1.6 (ref 1.2–2.2)
Albumin: 4.4 g/dL (ref 3.9–5.0)
Alkaline Phosphatase: 81 IU/L (ref 44–121)
BUN/Creatinine Ratio: 11 (ref 9–23)
BUN: 8 mg/dL (ref 6–20)
Bilirubin Total: <0.2 mg/dL (ref 0.0–1.2)
CO2: 21 mmol/L (ref 20–29)
Calcium: 9.7 mg/dL (ref 8.7–10.2)
Chloride: 101 mmol/L (ref 96–106)
Creatinine, Ser: 0.75 mg/dL (ref 0.57–1.00)
Globulin, Total: 2.8 g/dL (ref 1.5–4.5)
Glucose: 103 mg/dL — ABNORMAL HIGH (ref 65–99)
Potassium: 4.7 mmol/L (ref 3.5–5.2)
Sodium: 137 mmol/L (ref 134–144)
Total Protein: 7.2 g/dL (ref 6.0–8.5)
eGFR: 110 mL/min/{1.73_m2} (ref >59)

## 2021-04-28 LAB — CBC WITH DIFFERENTIAL/PLATELET
Basophils Absolute: 0.1 10*3/uL (ref 0.0–0.2)
Basos: 1 %
EOS (ABSOLUTE): 0.2 10*3/uL (ref 0.0–0.4)
Eos: 2 %
Hematocrit: 41.6 % (ref 34.0–46.6)
Hemoglobin: 14.1 g/dL (ref 11.1–15.9)
Immature Grans (Abs): 0 10*3/uL (ref 0.0–0.1)
Immature Granulocytes: 0 %
Lymphocytes Absolute: 2.8 10*3/uL (ref 0.7–3.1)
Lymphs: 23 %
MCH: 31.1 pg (ref 26.6–33.0)
MCHC: 33.9 g/dL (ref 31.5–35.7)
MCV: 92 fL (ref 79–97)
Monocytes Absolute: 0.7 10*3/uL (ref 0.1–0.9)
Monocytes: 6 %
Neutrophils Absolute: 8.3 10*3/uL — ABNORMAL HIGH (ref 1.4–7.0)
Neutrophils: 68 %
Platelets: 432 10*3/uL (ref 150–450)
RBC: 4.54 x10E6/uL (ref 3.77–5.28)
RDW: 12.9 % (ref 11.7–15.4)
WBC: 12.1 10*3/uL — ABNORMAL HIGH (ref 3.4–10.8)

## 2021-04-28 LAB — IRON,TIBC AND FERRITIN PANEL
Ferritin: 30 ng/mL (ref 15–150)
Iron Saturation: 22 % (ref 15–55)
Iron: 94 ug/dL (ref 27–159)
Total Iron Binding Capacity: 430 ug/dL (ref 250–450)
UIBC: 336 ug/dL (ref 131–425)

## 2021-04-28 LAB — VITAMIN D 25 HYDROXY (VIT D DEFICIENCY, FRACTURES): Vit D, 25-Hydroxy: 28.2 ng/mL — ABNORMAL LOW (ref 30.0–100.0)

## 2021-04-28 LAB — VITAMIN B12: Vitamin B-12: 228 pg/mL — ABNORMAL LOW (ref 232–1245)

## 2021-04-28 LAB — TSH: TSH: 2.03 u[IU]/mL (ref 0.450–4.500)

## 2021-04-28 LAB — HEPATITIS C ANTIBODY: Hep C Virus Ab: <0.1 s/co ratio (ref 0.0–0.9)

## 2021-04-28 NOTE — Progress Notes (Signed)
Contacted via [place] morning [place], your labs have returned: - CBC shows mild elevation in white blood cell count, which was noted 4 months ago on labs too, but this is trending down. Neutrophils also mildly elevated.  Have you been sick recently?  We will recheck these next visit to ensure continue to trend down.   - CMP shows mild elevation in glucose at 103, we will continue to monitor this.  Kidney function, creatinine and eGFR, and liver function, AST and ALT, are normal. - Vitamin D is a little low -- I recommend taking Vitamin D3 2000 units daily, which you can obtain over the counter, as this is good for overall health. - Vitamin B12 level is low at 228, would like to be above 300.  I recommend starting Vitamin B12 1000 MCG daily over the counter -- this is good for nervous system health and helps with fatigue/energy. - Thyroid is normal.  Iron level normal. Hep C is negative. - Cholesterol levels mildly elevated with LDL, bad cholesterol, 110.  I recommend focus on diet and regular exercise -- no medications needed.  Will continue to monitor.   - My awesome referral staff is working on your therapy referral, this can take time dependent on insurance -- you can also look at [facility] on [facility] area and see if [facility] an option on phone.  Any questions? Keep being awesome!!  Thank you for allowing me to participate in your care.  I appreciate you. Kindest regards, Allianna Beaubien

## 2021-05-04 DIAGNOSIS — Z419 Encounter for procedure for purposes other than remedying health state, unspecified: Secondary | ICD-10-CM | POA: Diagnosis not present

## 2021-05-06 ENCOUNTER — Encounter: Payer: Medicaid Other | Admitting: Certified Nurse Midwife

## 2021-05-19 ENCOUNTER — Other Ambulatory Visit: Payer: Self-pay | Admitting: Nurse Practitioner

## 2021-05-19 NOTE — Telephone Encounter (Signed)
  Notes to clinic: patient due for follow up on 05/25/2021 Patient is requesting a 90 day supply    Requested Prescriptions  Pending Prescriptions Disp Refills   citalopram (CELEXA) 10 MG tablet [Pharmacy Med Name: CITALOPRAM HBR 10 MG TABLET] 90 tablet 2    Sig: TAKE 1 TABLET BY MOUTH EVERY DAY      Psychiatry:  Antidepressants - SSRI Passed - 05/19/2021  9:35 AM      Passed - Completed PHQ-2 or PHQ-9 in the last 360 days      Passed - Valid encounter within last 6 months    Recent Outpatient Visits           3 weeks ago Encounter to establish care   [facility] Marjie Skiff, NP       Future Appointments             In 6 days Marjie Skiff, NP [facility]   In 1 week [place], Vanessa Easton, CNM [facility]

## 2021-05-19 NOTE — Telephone Encounter (Signed)
Scheduled 6/22

## 2021-05-23 ENCOUNTER — Encounter: Payer: Self-pay | Admitting: Nurse Practitioner

## 2021-05-23 ENCOUNTER — Telehealth (INDEPENDENT_AMBULATORY_CARE_PROVIDER_SITE_OTHER): Payer: Medicaid Other | Admitting: Nurse Practitioner

## 2021-05-23 DIAGNOSIS — J029 Acute pharyngitis, unspecified: Secondary | ICD-10-CM

## 2021-05-23 DIAGNOSIS — J069 Acute upper respiratory infection, unspecified: Secondary | ICD-10-CM

## 2021-05-23 MED ORDER — HYDROCOD POLST-CPM POLST ER 10-8 MG/5ML PO SUER
5.0000 mL | Freq: Two times a day (BID) | ORAL | 0 refills | Status: DC | PRN
Start: 2021-05-23 — End: 2021-06-03

## 2021-05-23 MED ORDER — METHYLPREDNISOLONE 4 MG PO TBPK: ORAL_TABLET | ORAL | 0 refills | Status: DC

## 2021-05-23 NOTE — Progress Notes (Signed)
There were no vitals taken for this visit.   Subjective:    Patient ID: Danielle Gillespie, female    DOB: [birth year], 30 y.o.   MRN: [id number]  HPI: Danielle Gillespie is a 30 y.o. female  Chief Complaint  Patient presents with   URI    Cough, stuffy nose, sore throat X 2 days.    UPPER RESPIRATORY TRACT INFECTION Worst symptom: two covid tests Fever: no Cough: yes Shortness of breath: no Wheezing: no Chest pain: no Chest tightness: no Chest congestion: yes Nasal congestion: yes Runny nose: yes Post nasal drip: yes Sneezing: yes Sore throat: yes Swollen glands: yes Sinus pressure: yes Headache: yes Face pain: no Toothache: no Ear pain: no bilateral Ear pressure: no bilateral Eyes red/itching:no Eye drainage/crusting: no  Vomiting: no Rash: no Fatigue: yes Sick contacts: yes Strep contacts: no  Context: stable Recurrent sinusitis: no Relief with OTC cold/cough medications:  E[place]   Treatments attempted: none    Relevant past medical, surgical, family and social history reviewed and updated as indicated. Interim medical history since our last visit reviewed. Allergies and medications reviewed and updated.  Review of Systems  Constitutional:  Positive for fatigue. Negative for fever.  HENT:  Positive for congestion, postnasal drip, sinus pressure, sneezing and sore throat. Negative for dental problem, ear pain, rhinorrhea and sinus pain.   Respiratory:  Positive for cough. Negative for shortness of breath and wheezing.   Cardiovascular:  Negative for chest pain.  Gastrointestinal:  Negative for vomiting.  Skin:  Negative for rash.  Neurological:  Positive for headaches.   Per HPI unless specifically indicated above     Objective:    There were no vitals taken for this visit.  Wt Readings from Last 3 Encounters:  04/27/21 186 lb 12.8 oz (84.7 kg)  01/21/21 181 lb 3.2 oz (82.2 kg)  12/24/20 177 lb (80.3 kg)    Physical Exam Vitals and nursing note  reviewed.  HENT:     Head: Normocephalic.     Right Ear: Hearing normal.     Left Ear: Hearing normal.     Nose: Nose normal.  Eyes:     Pupils: Pupils are equal, round, and reactive to light.  Pulmonary:     Effort: Pulmonary effort is normal. No respiratory distress.  Neurological:     Mental Status: She is alert.  Psychiatric:        Mood and Affect: Mood normal.        Behavior: Behavior normal.        Thought Content: Thought content normal.        Judgment: Judgment normal.    Results for orders placed or performed in visit on 05/23/21  Rapid Strep screen([facility])   Specimen: Other   Other  Result Value Ref Range   Strep Gp A Ag, IA W/Reflex Negative Negative  Culture, Group A Strep   Other  Result Value Ref Range   Strep A Culture WILL FOLLOW       Assessment & Plan:   Problem List Items Addressed This Visit   None Visit Diagnoses     Upper respiratory tract infection, unspecified type    -  Primary   Prednisone and Tussonex sent for patient to help with symptoms. Will test for strep and strep with antibiotics if necessary. FU if symptoms do not improve.   Relevant Medications   chlorpheniramine-HYDROcodone (TUSSIONEX PENNKINETIC ER) 10-8 MG/5ML SUER   methylPREDNISolone (MEDROL DOSEPAK) 4 MG TBPK tablet  Sore throat       Relevant Orders   Rapid Strep screen([facility]) (Completed)        Follow up plan: Return if symptoms worsen or fail to improve.   This visit was completed via MyChart due to the restrictions of the COVID-19 pandemic. All issues as above were discussed and addressed. Physical exam was done as above through visual confirmation on MyChart. If it was felt that the patient should be evaluated in the office, they were directed there. The patient verbally consented to this visit. Location of the patient: Home Location of the provider: Office Those involved with this call:  Provider: Larae Grooms, NP CMA: Tiffany Reel,  CMA Front Desk/Registration: Harriet Pho Time spent on call: 15 minutes with patient face to face via video conference. More than 50% of this time was spent in counseling and coordination of care. 20 minutes total spent in review of patient's record and preparation of their chart.

## 2021-05-24 ENCOUNTER — Other Ambulatory Visit: Payer: Medicaid Other

## 2021-05-24 DIAGNOSIS — J029 Acute pharyngitis, unspecified: Secondary | ICD-10-CM | POA: Diagnosis not present

## 2021-05-24 NOTE — Progress Notes (Signed)
Please let patient know her strep test was negative. No need for antibiotics at this time.

## 2021-05-25 ENCOUNTER — Ambulatory Visit: Payer: Medicaid Other | Admitting: Nurse Practitioner

## 2021-05-28 LAB — RAPID STREP SCREEN (MED CTR MEBANE ONLY): Strep Gp A Ag, IA W/Reflex: NEGATIVE

## 2021-05-28 LAB — CULTURE, GROUP A STREP: Strep A Culture: NEGATIVE

## 2021-05-29 NOTE — Progress Notes (Signed)
Hi [place].  Your strep culture was negative.

## 2021-05-30 ENCOUNTER — Ambulatory Visit (INDEPENDENT_AMBULATORY_CARE_PROVIDER_SITE_OTHER): Payer: Medicaid Other | Admitting: Certified Nurse Midwife

## 2021-05-30 ENCOUNTER — Encounter: Payer: Self-pay | Admitting: Certified Nurse Midwife

## 2021-05-30 ENCOUNTER — Other Ambulatory Visit: Payer: Self-pay

## 2021-05-30 ENCOUNTER — Other Ambulatory Visit (HOSPITAL_COMMUNITY)
Admission: RE | Admit: 2021-05-30 | Discharge: 2021-05-30 | Disposition: A | Discharge status: Home / Self Care | Payer: Medicaid Other | Source: Ambulatory Visit | Attending: Certified Nurse Midwife | Admitting: Certified Nurse Midwife

## 2021-05-30 VITALS — BP 111/75 | HR 82 | Ht 63.0 in | Wt 181.5 lb

## 2021-05-30 DIAGNOSIS — Z124 Encounter for screening for malignant neoplasm of cervix: Secondary | ICD-10-CM | POA: Diagnosis not present

## 2021-05-30 DIAGNOSIS — Z01419 Encounter for gynecological examination (general) (routine) without abnormal findings: Secondary | ICD-10-CM

## 2021-05-30 DIAGNOSIS — Z3041 Encounter for surveillance of contraceptive pills: Secondary | ICD-10-CM

## 2021-05-30 DIAGNOSIS — Z1151 Encounter for screening for human papillomavirus (HPV): Secondary | ICD-10-CM | POA: Diagnosis not present

## 2021-05-30 DIAGNOSIS — N9089 Other specified noninflammatory disorders of vulva and perineum: Secondary | ICD-10-CM

## 2021-05-30 MED ORDER — NORGESTIMATE-ETH ESTRADIOL 0.25-35 MG-MCG PO TABS: 1.0000 | ORAL_TABLET | Freq: Every day | ORAL | 4 refills | Status: DC

## 2021-05-30 NOTE — Progress Notes (Signed)
ANNUAL PREVENTATIVE CARE GYN  ENCOUNTER NOTE  Subjective:       Danielle Gillespie is a 30 y.o. G68P1001 female here for a routine annual gynecologic exam.  Current complaints: 1. Needs Pap smear 2. Reports clitorial pain with touch and simulation since giving birth  Denies difficulty breathing or respiratory distress, chest pain, abdominal pain, excessive vaginal bleeding, dysuria, and leg pain or swelling.    Gynecologic History  Patient's last menstrual period was 05/09/2021 (exact date).  Contraception: OCP (estrogen/progesterone)  Last Pap: 2019. Results were: normal  Obstetric History  OB History  Gravida Para Term Preterm AB Living  1 1 1     1   SAB IAB Ectopic Multiple Live Births        0 1    # Outcome Date GA Lbr Len/2nd Weight Sex Delivery Anes PTL Lv  1 Term 12/05/20 [redacted]w[redacted]d 24:14 / 06:02 6 lb 14.1 oz (3.12 kg) F Vag-Vacuum EPI, Local  LIV    Past Medical History:  Diagnosis Date   Anxiety    Depression     Past Surgical History:  Procedure Laterality Date   NO PAST SURGERIES      Current Outpatient Medications on File Prior to Visit  Medication Sig Dispense Refill   chlorpheniramine-HYDROcodone (TUSSIONEX PENNKINETIC ER) 10-8 MG/5ML SUER Take 5 mLs by mouth every 12 (twelve) hours as needed for cough. 115 mL 0   citalopram (CELEXA) 10 MG tablet TAKE 1 TABLET BY MOUTH EVERY DAY 90 tablet 4   methylPREDNISolone (MEDROL DOSEPAK) 4 MG TBPK tablet Take as directed. 21 tablet 0   No current facility-administered medications on file prior to visit.    No Known Allergies  Social History   Socioeconomic History   Marital status: Single    Spouse name: Not on file   Number of children: Not on file   Years of education: Not on file   Highest education level: Not on file  Occupational History   Not on file  Tobacco Use   Smoking status: Former    Packs/day: 1.00    Pack years: 0.00    Types: Cigarettes    Quit date: 04/05/2020    Years since quitting: 1.1    Smokeless tobacco: Never   Tobacco comments:    09/16/20 pt states she smoked half a cigarrete yesterday.  Vaping Use   Vaping Use: Former   Substances: Nicotine, Flavoring  Substance and Sexual Activity   Alcohol use: No   Drug use: Not Currently   Sexual activity: Yes    Partners: Male    Birth control/protection: Condom  Other Topics Concern   Not on file  Social History Narrative   Not on file   Social Determinants of Health   Financial Resource Strain: Low Risk    Difficulty of Paying Living Expenses: Not hard at all  Food Insecurity: No Food Insecurity   Worried About 09/18/20 in the Last Year: Never true   Ran Out of Food in the Last Year: Never true  Transportation Needs: No Transportation Needs   Lack of Transportation (Medical): No   Lack of Transportation (Non-Medical): No  Physical Activity: Inactive   Days of Exercise per Week: 0 days   Minutes of Exercise per Session: 0 min  Stress: Stress Concern Present   Feeling of Stress : To some extent  Social Connections: Moderately Isolated   Frequency of Communication with Friends and Family: Three times a week   Frequency of Social  Gatherings with Friends and Family: Three times a week   Attends Religious Services: Never   Active Member of Clubs or Organizations: No   Attends Engineer, structural: Never   Marital Status: Living with partner  Intimate Partner Violence: Not At Risk   Fear of Current or Ex-Partner: No   Emotionally Abused: No   Physically Abused: No   Sexually Abused: No    Family History  Problem Relation Age of Onset   Depression Mother    Diabetes Maternal Grandmother    Bipolar disorder Maternal Grandmother    Heart disease Maternal Grandmother    Depression Maternal Grandmother    Healthy Father    Dementia Paternal Grandmother    Depression Sister    Depression Brother     The following portions of the patient's history were reviewed and updated as  appropriate: allergies, current medications, past family history, past medical history, past social history, past surgical history and problem list.  Review of Systems  ROS negative except as noted above. Information obtained from patient.    Objective:   BP 111/75   Pulse 82   Ht 5\' 3"  (1.6 m)   Wt 181 lb 8 oz (82.3 kg)   LMP 05/09/2021 (Exact Date)   Breastfeeding No   BMI 32.15 kg/m   CONSTITUTIONAL: Well-developed, well-nourished female in no acute distress.   PSYCHIATRIC: Normal mood and affect. Normal behavior. Normal judgment and thought content.  NEUROLGIC: Alert and oriented to person, place, and time. Normal muscle tone coordination. No  cranial nerve deficit noted.  HENT:  Normocephalic, atraumatic.  EYES: Conjunctivae and EOM are normal.   NECK: Normal range of motion, supple, no masses.  Normal thyroid.   SKIN: Skin is warm and dry. No rash noted. Not diaphoretic. No erythema. No pallor. Professional tattoos present.   CARDIOVASCULAR: Normal heart rate noted, regular rhythm, no murmur.  RESPIRATORY: Clear to auscultation bilaterally. Effort and breath sounds normal, no problems with respiration noted.  BREASTS: Symmetric in size. No masses, skin changes, nipple drainage, or lymphadenopathy.  ABDOMEN: Soft, normal bowel sounds, no distention noted.  No tenderness, rebound or guarding.   PELVIC:  External Genitalia: Normal except mild erythema present   Vagina: Normal  Cervix: Normal, Pap collected  MUSCULOSKELETAL: Normal range of motion. No tenderness.  No cyanosis, clubbing, or edema.  2+ distal pulses.  LYMPHATIC: No Axillary, Supraclavicular, or Inguinal Adenopathy.  Assessment:   Annual gynecologic examination 30 y.o.  Contraception: OCP (estrogen/progesterone)  Obesity 1  Problem List Items Addressed This Visit   None Visit Diagnoses     Well woman exam    -  Primary   Relevant Orders   Cytology - PAP   Screening for cervical cancer        Relevant Orders   Cytology - PAP   Irritation of clitoris       Surveillance for birth control, oral contraceptives           Plan:   Pap: Pap Co Test  Labs: Declined  Discussed home treatment measures for postpartum clitorial pain; information sent via MyChart  Rx OCP, see orders  Routine preventative health maintenance measures emphasized: Exercise/Diet/Weight control, Tobacco Warnings, Alcohol/Substance use risks, and Stress Management; see AVS  Reviewed red flag symptoms and when to call  Return to Clinic - 1 Year for 26 or sooner if needed   [employer], CNM  [facility], [facility] 05/30/21 11:54 AM

## 2021-05-30 NOTE — Patient Instructions (Signed)
Preventive Care 21-30 Years Old, Female Preventive care refers to lifestyle choices and visits with your health care provider that can promote health and wellness. This includes: A yearly physical exam. This is also called an annual wellness visit. Regular dental and eye exams. Immunizations. Screening for certain conditions. Healthy lifestyle choices, such as: Eating a healthy diet. Getting regular exercise. Not using drugs or products that contain nicotine and tobacco. Limiting alcohol use. What can I expect for my preventive care visit? Physical exam Your health care provider may check your: Height and weight. These may be used to calculate your BMI (body mass index). BMI is a measurement that tells if you are at a healthy weight. Heart rate and blood pressure. Body temperature. Skin for abnormal spots. Counseling Your health care provider may ask you questions about your: Past medical problems. Family's medical history. Alcohol, tobacco, and drug use. Emotional well-being. Home life and relationship well-being. Sexual activity. Diet, exercise, and sleep habits. Work and work environment. Access to firearms. Method of birth control. Menstrual cycle. Pregnancy history. What immunizations do I need?  Vaccines are usually given at various ages, according to a schedule. Your health care provider will recommend vaccines for you based on your age, medicalhistory, and lifestyle or other factors, such as travel or where you work. What tests do I need?  Blood tests Lipid and cholesterol levels. These may be checked every 5 years starting at age 20. Hepatitis C test. Hepatitis B test. Screening Diabetes screening. This is done by checking your blood sugar (glucose) after you have not eaten for a while (fasting). STD (sexually transmitted disease) testing, if you are at risk. BRCA-related cancer screening. This may be done if you have a family history of breast, ovarian, tubal, or  peritoneal cancers. Pelvic exam and Pap test. This may be done every 3 years starting at age 21. Starting at age 30, this may be done every 5 years if you have a Pap test in combination with an HPV test. Talk with your health care provider about your test results, treatment options,and if necessary, the need for more tests. Follow these instructions at home: Eating and drinking  Eat a healthy diet that includes fresh fruits and vegetables, whole grains, lean protein, and low-fat dairy products. Take vitamin and mineral supplements as recommended by your health care provider. Do not drink alcohol if: Your health care provider tells you not to drink. You are pregnant, may be pregnant, or are planning to become pregnant. If you drink alcohol: Limit how much you have to 0-1 drink a day. Be aware of how much alcohol is in your drink. In the [place] one drink equals one 12 oz bottle of beer (355 mL), one 5 oz glass of wine (148 mL), or one 1 oz glass of hard liquor (44 mL).  Lifestyle Take daily care of your teeth and gums. Brush your teeth every morning and night with fluoride toothpaste. Floss one time each day. Stay active. Exercise for at least 30 minutes 5 or more days each week. Do not use any products that contain nicotine or tobacco, such as cigarettes, e-cigarettes, and chewing tobacco. If you need help quitting, ask your health care provider. Do not use drugs. If you are sexually active, practice safe sex. Use a condom or other form of protection to prevent STIs (sexually transmitted infections). If you do not wish to become pregnant, use a form of birth control. If you plan to become pregnant, see your health care   provider for a prepregnancy visit. Find healthy ways to cope with stress, such as: Meditation, yoga, or listening to music. Journaling. Talking to a trusted person. Spending time with friends and family. Safety Always wear your seat belt while driving or riding in a  vehicle. Do not drive: If you have been drinking alcohol. Do not ride with someone who has been drinking. When you are tired or distracted. While texting. Wear a helmet and other protective equipment during sports activities. If you have firearms in your house, make sure you follow all gun safety procedures. Seek help if you have been physically or sexually abused. What's next? Go to your health care provider once a year for an annual wellness visit. Ask your health care provider how often you should have your eyes and teeth checked. Stay up to date on all vaccines. This information is not intended to replace advice given to you by your health care provider. Make sure you discuss any questions you have with your healthcare provider. Document Revised: 07/18/2020 Document Reviewed: 08/01/2018 Elsevier Patient Education  2022 [employer].

## 2021-06-01 LAB — CYTOLOGY - PAP
Comment: NEGATIVE
Diagnosis: NEGATIVE
High risk HPV: NEGATIVE

## 2021-06-03 ENCOUNTER — Other Ambulatory Visit: Payer: Self-pay

## 2021-06-03 ENCOUNTER — Ambulatory Visit (INDEPENDENT_AMBULATORY_CARE_PROVIDER_SITE_OTHER): Payer: Medicaid Other | Admitting: Nurse Practitioner

## 2021-06-03 ENCOUNTER — Encounter: Payer: Self-pay | Admitting: Nurse Practitioner

## 2021-06-03 VITALS — BP 112/78 | HR 101 | Temp 98.0°F | Wt 181.6 lb

## 2021-06-03 DIAGNOSIS — J069 Acute upper respiratory infection, unspecified: Secondary | ICD-10-CM | POA: Diagnosis not present

## 2021-06-03 DIAGNOSIS — H6690 Otitis media, unspecified, unspecified ear: Secondary | ICD-10-CM | POA: Diagnosis not present

## 2021-06-03 DIAGNOSIS — Z419 Encounter for procedure for purposes other than remedying health state, unspecified: Secondary | ICD-10-CM | POA: Diagnosis not present

## 2021-06-03 MED ORDER — HYDROCOD POLST-CPM POLST ER 10-8 MG/5ML PO SUER
5.0000 mL | Freq: Two times a day (BID) | ORAL | 0 refills | Status: DC | PRN
Start: 2021-06-03 — End: 2021-06-28

## 2021-06-03 MED ORDER — FLUTICASONE PROPIONATE 50 MCG/ACT NA SUSP: 2.0000 | Freq: Every day | NASAL | 6 refills | Status: DC

## 2021-06-03 MED ORDER — AMOXICILLIN-POT CLAVULANATE 875-125 MG PO TABS: 1.0000 | ORAL_TABLET | Freq: Two times a day (BID) | ORAL | 0 refills | Status: DC

## 2021-06-03 MED ORDER — FLUCONAZOLE 150 MG PO TABS: 150.0000 mg | ORAL_TABLET | Freq: Every day | ORAL | 0 refills | Status: DC

## 2021-06-03 NOTE — Patient Instructions (Signed)
Start flonase daily Start augmentin twice a day for 10 days Drink plenty of fluids Cough medicine as needed Start zyrtec or claritin daily over the counter

## 2021-06-03 NOTE — Progress Notes (Signed)
Acute Office Visit  Subjective:    Patient ID: Danielle Gillespie, female    DOB: Nov 26, 1991, 30 y.o.   MRN: [id number]  Chief Complaint  Patient presents with   Dizziness    yesterday   ear pressure    For the past 3 days for the left, but happens in both   Cough    Started about a week or so ago.    HPI Patient is in today for ear pressure and dizziness  DIZZINESS  Duration: days Description of symptoms: room spinning Duration of episode: hours Dizziness frequency: no history of the same Provoking factors:  bending over Aggravating factors:   bending over Triggered by rolling over in bed: no Triggered by bending over: yes Aggravated by head movement:  a little Aggravated by exertion, coughing, loud noises: no Recent head injury: no Recent or current viral symptoms: yes History of vasovagal episodes: no Nausea:  once ,yesterday Vomiting: no Tinnitus: yes Hearing loss: yes Aural fullness: yes Headache: yes Photophobia/phonophobia: yes Unsteady gait: no Postural instability: no Diplopia, dysarthria, dysphagia or weakness: no Related to exertion: no Pallor: no Diaphoresis: yes Dyspnea: no Chest pain: no  EAG CLOGGED  Duration: 3 days Involved ear(s): yes bilateral Sensation of feeling clogged/plugged: yes Decreased/muffled hearing:yes Ear pain: yes, achy Fever: no Otorrhea: no Hearing loss: yes Upper respiratory infection symptoms: yes Using Q-Tips: yes Status: worse History of cerumenosis: no Treatments attempted:  OTC ear drops cerumenolytic   Past Medical History:  Diagnosis Date   Anxiety    Depression     Past Surgical History:  Procedure Laterality Date   NO PAST SURGERIES      Family History  Problem Relation Age of Onset   Depression Mother    Diabetes Maternal Grandmother    Bipolar disorder Maternal Grandmother    Heart disease Maternal Grandmother    Depression Maternal Grandmother    Healthy Father    Dementia Paternal  Grandmother    Depression Sister    Depression Brother     Social History   Socioeconomic History   Marital status: Single    Spouse name: Not on file   Number of children: Not on file   Years of education: Not on file   Highest education level: Not on file  Occupational History   Not on file  Tobacco Use   Smoking status: Former    Packs/day: 1.00    Pack years: 0.00    Types: Cigarettes    Quit date: 04/05/2020    Years since quitting: 1.1   Smokeless tobacco: Never   Tobacco comments:    09/16/20 pt states she smoked half a cigarrete yesterday.  Vaping Use   Vaping Use: Former   Substances: Nicotine, Flavoring  Substance and Sexual Activity   Alcohol use: No   Drug use: Not Currently   Sexual activity: Yes    Partners: Male    Birth control/protection: Condom  Other Topics Concern   Not on file  Social History Narrative   Not on file   Social Determinants of Health   Financial Resource Strain: Low Risk    Difficulty of Paying Living Expenses: Not hard at all  Food Insecurity: No Food Insecurity   Worried About Charity fundraiser in the Last Year: Never true   [place] in the Last Year: Never true  Transportation Needs: No Transportation Needs   Lack of Transportation (Medical): No   Lack of Transportation (Non-Medical): No  Physical Activity: Inactive   Days of Exercise per Week: 0 days   Minutes of Exercise per Session: 0 min  Stress: Stress Concern Present   Feeling of Stress : To some extent  Social Connections: Moderately Isolated   Frequency of Communication with Friends and Family: Three times a week   Frequency of Social Gatherings with Friends and Family: Three times a week   Attends Religious Services: Never   Active Member of Clubs or Organizations: No   Attends Archivist Meetings: Never   Marital Status: Living with partner  Intimate Partner Violence: Not At Risk   Fear of Current or Ex-Partner: No   Emotionally Abused: No    Physically Abused: No   Sexually Abused: No    Outpatient Medications Prior to Visit  Medication Sig Dispense Refill   citalopram (CELEXA) 10 MG tablet TAKE 1 TABLET BY MOUTH EVERY DAY 90 tablet 4   norgestimate-ethinyl estradiol (ORTHO-CYCLEN) 0.25-35 MG-MCG tablet Take 1 tablet by mouth daily. 84 tablet 4   chlorpheniramine-HYDROcodone (TUSSIONEX PENNKINETIC ER) 10-8 MG/5ML SUER Take 5 mLs by mouth every 12 (twelve) hours as needed for cough. 115 mL 0   methylPREDNISolone (MEDROL DOSEPAK) 4 MG TBPK tablet Take as directed. 21 tablet 0   No facility-administered medications prior to visit.    No Known Allergies  Review of Systems  Constitutional:  Positive for fatigue. Negative for fever.  HENT:  Positive for congestion and ear pain. Negative for sore throat.   Eyes: Negative.   Respiratory:  Positive for cough. Negative for shortness of breath.   Cardiovascular: Negative.   Gastrointestinal: Negative.   Genitourinary: Negative.   Musculoskeletal: Negative.   Skin: Negative.   Neurological:  Positive for dizziness. Negative for headaches.      Objective:    Physical Exam Vitals and nursing note reviewed.  Constitutional:      General: She is not in acute distress.    Appearance: Normal appearance.  HENT:     Head: Normocephalic.     Right Ear: Ear canal and external ear normal. A middle ear effusion is present. Tympanic membrane is erythematous.     Left Ear: Ear canal and external ear normal. A middle ear effusion is present. Tympanic membrane is erythematous.  Eyes:     Conjunctiva/sclera: Conjunctivae normal.  Cardiovascular:     Rate and Rhythm: Normal rate and regular rhythm.     Pulses: Normal pulses.     Heart sounds: Normal heart sounds.  Pulmonary:     Effort: Pulmonary effort is normal.     Breath sounds: Normal breath sounds.  Musculoskeletal:     Cervical back: Normal range of motion.  Skin:    General: Skin is warm.  Neurological:     General: No  focal deficit present.     Mental Status: She is alert and oriented to person, place, and time.  Psychiatric:        Mood and Affect: Mood normal.        Behavior: Behavior normal.        Thought Content: Thought content normal.        Judgment: Judgment normal.    BP 112/78   Pulse (!) 101   Temp 98 F (36.7 C) (Oral)   Wt 181 lb 9.6 oz (82.4 kg)   LMP 05/09/2021 (Exact Date)   SpO2 98%   BMI 32.17 kg/m  Wt Readings from Last 3 Encounters:  06/03/21 181 lb 9.6 oz (82.4 kg)  05/30/21 181 lb 8 oz (82.3 kg)  04/27/21 186 lb 12.8 oz (84.7 kg)    There are no preventive care reminders to display for this patient.  There are no preventive care reminders to display for this patient.   Lab Results  Component Value Date   TSH 2.030 04/27/2021   Lab Results  Component Value Date   WBC 12.1 (H) 04/27/2021   HGB 14.1 04/27/2021   HCT 41.6 04/27/2021   MCV 92 04/27/2021   PLT 432 04/27/2021   Lab Results  Component Value Date   NA 137 04/27/2021   K 4.7 04/27/2021   CO2 21 04/27/2021   GLUCOSE 103 (H) 04/27/2021   BUN 8 04/27/2021   CREATININE 0.75 04/27/2021   BILITOT <0.2 04/27/2021   ALKPHOS 81 04/27/2021   AST 17 04/27/2021   ALT 23 04/27/2021   PROT 7.2 04/27/2021   ALBUMIN 4.4 04/27/2021   CALCIUM 9.7 04/27/2021   ANIONGAP 10 12/04/2020   EGFR 110 04/27/2021   Lab Results  Component Value Date   CHOL 194 04/27/2021   Lab Results  Component Value Date   HDL 46 04/27/2021   Lab Results  Component Value Date   LDLCALC 110 (H) 04/27/2021   Lab Results  Component Value Date   TRIG 217 (H) 04/27/2021   No results found for: CHOLHDL No results found for: HGBA1C     Assessment & Plan:   Problem List Items Addressed This Visit   None Visit Diagnoses     Acute otitis media, unspecified otitis media type    -  Primary   Bilateral ears. Will treat with augmentin. Can use flonase and claritin or zyrtec to help. Tylenol/ibuprofen for pain. F/U if  symptoms worsen.    Relevant Medications   amoxicillin-clavulanate (AUGMENTIN) 875-125 MG tablet   Upper respiratory tract infection, unspecified type       Continued cough, and now dizziness and bilateral ear pain and pressure. Will treat for middle ear infection and refill tussionex. PDMP reviewed   Relevant Medications   chlorpheniramine-HYDROcodone (TUSSIONEX PENNKINETIC ER) 10-8 MG/5ML SUER        Meds ordered this encounter  Medications   chlorpheniramine-HYDROcodone (TUSSIONEX PENNKINETIC ER) 10-8 MG/5ML SUER    Sig: Take 5 mLs by mouth every 12 (twelve) hours as needed for cough.    Dispense:  115 mL    Refill:  0   amoxicillin-clavulanate (AUGMENTIN) 875-125 MG tablet    Sig: Take 1 tablet by mouth 2 (two) times daily.    Dispense:  20 tablet    Refill:  0   fluticasone (FLONASE) 50 MCG/ACT nasal spray    Sig: Place 2 sprays into both nostrils daily.    Dispense:  16 g    Refill:  [place], NP

## 2021-06-08 ENCOUNTER — Ambulatory Visit (INDEPENDENT_AMBULATORY_CARE_PROVIDER_SITE_OTHER): Payer: Medicaid Other | Admitting: Nurse Practitioner

## 2021-06-08 ENCOUNTER — Other Ambulatory Visit: Payer: Self-pay

## 2021-06-08 ENCOUNTER — Encounter: Payer: Self-pay | Admitting: Nurse Practitioner

## 2021-06-08 VITALS — BP 102/69 | HR 82 | Temp 98.5°F | Wt 183.2 lb

## 2021-06-08 DIAGNOSIS — O99345 Other mental disorders complicating the puerperium: Secondary | ICD-10-CM | POA: Diagnosis not present

## 2021-06-08 DIAGNOSIS — E538 Deficiency of other specified B group vitamins: Secondary | ICD-10-CM

## 2021-06-08 DIAGNOSIS — F53 Postpartum depression: Secondary | ICD-10-CM | POA: Diagnosis not present

## 2021-06-08 DIAGNOSIS — E6609 Other obesity due to excess calories: Secondary | ICD-10-CM

## 2021-06-08 DIAGNOSIS — Z6832 Body mass index (BMI) 32.0-32.9, adult: Secondary | ICD-10-CM | POA: Diagnosis not present

## 2021-06-08 DIAGNOSIS — D729 Disorder of white blood cells, unspecified: Secondary | ICD-10-CM | POA: Diagnosis not present

## 2021-06-08 DIAGNOSIS — E559 Vitamin D deficiency, unspecified: Secondary | ICD-10-CM | POA: Diagnosis not present

## 2021-06-08 MED ORDER — CITALOPRAM HYDROBROMIDE 20 MG PO TABS: 20.0000 mg | ORAL_TABLET | Freq: Every day | ORAL | 3 refills | Status: DC

## 2021-06-08 MED ORDER — BUSPIRONE HCL 5 MG PO TABS: 5.0000 mg | ORAL_TABLET | Freq: Two times a day (BID) | ORAL | 12 refills | Status: DC

## 2021-06-08 NOTE — Assessment & Plan Note (Signed)
Noted on past labs with reported history of anemia -- she is taking supplement at this time.  Continue this and plan to recheck level next visit in 6 weeks.

## 2021-06-08 NOTE — Assessment & Plan Note (Signed)
BMI 32.45.  Recommended eating smaller high protein, low fat meals more frequently and exercising 30 mins a day 5 times a week with a goal of 10-15lb weight loss in the next 3 months. Patient voiced their understanding and motivation to adhere to these recommendations.

## 2021-06-08 NOTE — Assessment & Plan Note (Signed)
Noted on recent labs.  She is currently taking supplement as recommended, will recheck level next visit.

## 2021-06-08 NOTE — Patient Instructions (Signed)

## 2021-06-08 NOTE — Assessment & Plan Note (Signed)
Ongoing since delivery in January 2022.  At this time increase Celexa to 20 MG daily, educated on side effects and Black Box warning for increased suicidal ideation -- monitor for this.  Start Buspar 5 MG BID as needed, discussed she can take this twice a day scheduled. Denies SI/HI at this time.  Recommended virtual therapy via app like [facility], she is not interested in therapy at this time.  Return in 6 weeks for follow-up, sooner if worsening mood.

## 2021-06-08 NOTE — Progress Notes (Signed)
BP 102/69   Pulse 82   Temp 98.5 F (36.9 C) (Oral)   Wt 183 lb 3.2 oz (83.1 kg)   LMP 05/09/2021 (Exact Date)   SpO2 98%   BMI 32.45 kg/m    Subjective:    Patient ID: Danielle Gillespie, female    DOB: [birth year], 30 y.o.   MRN: [id number]  HPI: Danielle Gillespie is a 30 y.o. female  Chief Complaint  Patient presents with   Depression   DEPRESSION Started on Celexa 10 MG on 04/27/21. She reports on 05/08/21 got a job at [employer], when she went back for training had a mild panic attack while there -- walked out of training.  However, does report some improvement.  Last labs did noticed some low levels of Vitamin D and Vitamin B12, she has started supplements.  CBC noted some mild elevation in white blood cell and neutrophils, just getting over ear infection.   Mood status: controlled Satisfied with current treatment?: yes Symptom severity: moderate  Duration of current treatment : chronic Side effects: no Medication compliance: good compliance Psychotherapy/counseling: none Previous psychiatric medications: Celexa Depressed mood: yes Anxious mood: yes Anhedonia: no Significant weight loss or gain: no Insomnia: none Fatigue: no Feelings of worthlessness or guilt: no Impaired concentration/indecisiveness: no Suicidal ideations: no Hopelessness: no Crying spells: no Depression screen [facility] 2/9 06/08/2021 06/03/2021 05/30/2021 04/27/2021 01/21/2021  Decreased Interest 1 0 0 2 0  Down, Depressed, Hopeless 2 0 1 2 0  PHQ - 2 Score 3 0 1 4 0  Altered sleeping 0 - 2 3 0  Tired, decreased energy 2 - 2 3 0  Change in appetite 3 - 2 3 0  Feeling bad or failure about yourself  1 - 1 3 0  Trouble concentrating 0 - 0 2 0  Moving slowly or fidgety/restless 0 - 0 2 0  Suicidal thoughts 0 - 0 0 0  PHQ-9 Score 9 - 8 20 0  Difficult doing work/chores Somewhat difficult - Very difficult Very difficult -    GAD 7 : Generalized Anxiety Score 06/08/2021 05/30/2021 04/27/2021 01/21/2021  Nervous, Anxious,  on Edge 2 1 3  0  Control/stop worrying 2 3 3  0  Worry too much - different things 2 3 3  0  Trouble relaxing 2 1 3  0  Restless 1 0 1 0  Easily annoyed or irritable 0 0 2 0  Afraid - awful might happen 2 2 3  0  Total GAD 7 Score 11 10 18  0  Anxiety Difficulty Somewhat difficult Very difficult Very difficult -      Relevant past medical, surgical, family and social history reviewed and updated as indicated. Interim medical history since our last visit reviewed. Allergies and medications reviewed and updated.  Review of Systems  Constitutional:  Negative for activity change, appetite change, diaphoresis, fatigue and fever.  Respiratory:  Negative for cough, chest tightness and shortness of breath.   Cardiovascular:  Negative for chest pain, palpitations and leg swelling.  Gastrointestinal: Negative.   Neurological: Negative.   Psychiatric/Behavioral:  Positive for sleep disturbance. Negative for decreased concentration, self-injury and suicidal ideas. The patient is nervous/anxious.    Per HPI unless specifically indicated above     Objective:    BP 102/69   Pulse 82   Temp 98.5 F (36.9 C) (Oral)   Wt 183 lb 3.2 oz (83.1 kg)   LMP 05/09/2021 (Exact Date)   SpO2 98%   BMI 32.45 kg/m   Wt Readings from  Last 3 Encounters:  06/08/21 183 lb 3.2 oz (83.1 kg)  06/03/21 181 lb 9.6 oz (82.4 kg)  05/30/21 181 lb 8 oz (82.3 kg)    Physical Exam Vitals and nursing note reviewed.  Constitutional:      General: She is awake. She is not in acute distress.    Appearance: She is well-developed and well-groomed. She is obese. She is not ill-appearing or toxic-appearing.  HENT:     Head: Normocephalic.     Right Ear: Hearing normal.     Left Ear: Hearing normal.  Eyes:     General: Lids are normal.        Right eye: No discharge.        Left eye: No discharge.     Conjunctiva/sclera: Conjunctivae normal.     Pupils: Pupils are equal, round, and reactive to light.  Neck:      Thyroid: No thyromegaly.     Vascular: No carotid bruit.  Cardiovascular:     Rate and Rhythm: Normal rate and regular rhythm.     Heart sounds: Normal heart sounds. No murmur heard.   No gallop.  Pulmonary:     Effort: Pulmonary effort is normal. No accessory muscle usage or respiratory distress.     Breath sounds: Normal breath sounds.  Abdominal:     General: Bowel sounds are normal.     Palpations: Abdomen is soft. There is no hepatomegaly or splenomegaly.  Musculoskeletal:     Cervical back: Normal range of motion and neck supple.     Right lower leg: No edema.     Left lower leg: No edema.  Lymphadenopathy:     Cervical: No cervical adenopathy.  Skin:    General: Skin is warm and dry.  Neurological:     Mental Status: She is alert and oriented to person, place, and time.     Deep Tendon Reflexes: Reflexes are normal and symmetric.     Reflex Scores:      Brachioradialis reflexes are 2+ on the right side and 2+ on the left side.      Patellar reflexes are 2+ on the right side and 2+ on the left side. Psychiatric:        Attention and Perception: Attention normal.        Mood and Affect: Mood normal.        Speech: Speech normal.        Behavior: Behavior normal. Behavior is cooperative.        Thought Content: Thought content normal.    Results for orders placed or performed in visit on 05/30/21  Cytology - PAP  Result Value Ref Range   High risk HPV Negative    Adequacy      Satisfactory for evaluation; transformation zone component PRESENT.   Diagnosis      - Negative for intraepithelial lesion or malignancy (NILM)   Microorganisms      Fungal organisms present consistent with Candida spp.   Comment Normal Reference Range HPV - Negative       Assessment & Plan:   Problem List Items Addressed This Visit       Other   Vitamin B12 deficiency    Noted on past labs with reported history of anemia -- she is taking supplement at this time.  Continue this and plan  to recheck level next visit in 6 weeks.       Post partum depression - Primary    Ongoing since delivery  in January 2022.  At this time increase Celexa to 20 MG daily, educated on side effects and Black Box warning for increased suicidal ideation -- monitor for this.  Start Buspar 5 MG BID as needed, discussed she can take this twice a day scheduled. Denies SI/HI at this time.  Recommended virtual therapy via app like Talk [facility], she is not interested in therapy at this time.  Return in 6 weeks for follow-up, sooner if worsening mood.         Relevant Medications   citalopram (CELEXA) 20 MG tablet   busPIRone (BUSPAR) 5 MG tablet   Obesity    BMI 32.45.  Recommended eating smaller high protein, low fat meals more frequently and exercising 30 mins a day 5 times a week with a goal of 10-15lb weight loss in the next 3 months. Patient voiced their understanding and motivation to adhere to these recommendations.        Vitamin D deficiency    Noted on recent labs.  She is currently taking supplement as recommended, will recheck level next visit.       Neutrophilia    Getting over double ear infection at this time, will recheck CBC next visit once has recovered.         Follow up plan: Return in about 6 weeks (around 07/20/2021) for MOOD And LAB CHECK.

## 2021-06-08 NOTE — Assessment & Plan Note (Signed)
Getting over double ear infection at this time, will recheck CBC next visit once has recovered.

## 2021-06-28 ENCOUNTER — Other Ambulatory Visit: Payer: Self-pay

## 2021-06-28 ENCOUNTER — Encounter: Payer: Self-pay | Admitting: Nurse Practitioner

## 2021-06-28 ENCOUNTER — Ambulatory Visit (INDEPENDENT_AMBULATORY_CARE_PROVIDER_SITE_OTHER): Payer: Medicaid Other | Admitting: Nurse Practitioner

## 2021-06-28 VITALS — BP 106/69 | HR 88 | Temp 99.9°F | Wt 183.6 lb

## 2021-06-28 DIAGNOSIS — K640 First degree hemorrhoids: Secondary | ICD-10-CM | POA: Diagnosis not present

## 2021-06-28 DIAGNOSIS — H9201 Otalgia, right ear: Secondary | ICD-10-CM | POA: Diagnosis not present

## 2021-06-28 DIAGNOSIS — K5901 Slow transit constipation: Secondary | ICD-10-CM | POA: Diagnosis not present

## 2021-06-28 DIAGNOSIS — K5900 Constipation, unspecified: Secondary | ICD-10-CM | POA: Insufficient documentation

## 2021-06-28 DIAGNOSIS — K649 Unspecified hemorrhoids: Secondary | ICD-10-CM | POA: Insufficient documentation

## 2021-06-28 DIAGNOSIS — H9203 Otalgia, bilateral: Secondary | ICD-10-CM | POA: Insufficient documentation

## 2021-06-28 MED ORDER — HYDROCORTISONE (PERIANAL) 2.5 % EX CREA: 1.0000 "application " | TOPICAL_CREAM | Freq: Two times a day (BID) | CUTANEOUS | 1 refills | Status: DC

## 2021-06-28 MED ORDER — FLUCONAZOLE 150 MG PO TABS: 150.0000 mg | ORAL_TABLET | Freq: Once | ORAL | 0 refills | Status: AC

## 2021-06-28 NOTE — Assessment & Plan Note (Signed)
Over past couple weeks.  Suspect related to poor fiber intake.  Recommend starting Metamucil daily and ensuring plenty of fluid intake at home + fiber rich foods.  Continue Miralax as needed + Senna.  Plan on return to office in 2 weeks for follow-up.

## 2021-06-28 NOTE — Progress Notes (Signed)
BP 106/69   Pulse 88   Temp 99.9 F (37.7 C) (Oral)   Wt 183 lb 9.6 oz (83.3 kg)   SpO2 97%   BMI 32.52 kg/m    Subjective:    Patient ID: Danielle Gillespie, female    DOB: Mar 09, 1991, 30 y.o.   MRN: [id number]  HPI: Danielle Gillespie is a 30 y.o. female  Chief Complaint  Patient presents with   Hemorrhoids    Patient states she is having some slight rectal bleeding when she has a bowel movement. Patient states she does strain a little and states she thinks they are internal. Patient states when she is using the bathroom she is OK but when it is coming out is when it gets painful. Patient says she has tried the OTC suppositories she used 2 in a 24-hr span and after using them she noticed more bleeding. Patient states she is still bleeding when she wipes. Patient states her stools have been hard and tried Joellyn Rued    Patient states she is having issues with her ear as if she having pressure in her right ear now after completing the 10-day antibiotic. Patient states when she bends over her nose feels as if you are swimming and water gets in your nose.    Medication Refill    Patient is requesting a refill on Diflucan as she completed the antibiotic she thinks she may have another yeast infection.    Needs refill on Diflucan, got yeast with recent abx.  RECTAL BLEEDING Reports rectal bleeding with bowel movement which presented 1 1/2 weeks ago.  She does have history of hemorrhoids after pregnancy.  Has been a bit more constipated recently, for about 1 1/2 weeks -- stools have been firmer.  Did two suppositories on Thursday and then on Friday noticed more blood.  [place] stool chart is 3- 5.  Has been taking Miralax at home which helps a little bit.   Duration: 1 1/2 weeks Bright red rectal bleeding:  occasional   Amount of blood: minimal  Frequency: every time she has BM Melena: no  Spotting on toilet tissue: yes  Anal fullness: no  Perianal pain: no  Severity: none Perianal  irritation/itching: yes  Constipation: yes  Chronic straining/valsava:  yes  Anal trauma/intercourse: no  Hemorrhoids: yes  Previous colonoscopy: no    EAR PAIN Right ear continues to bother her -- treated in July for ear infection.  Using Flonase and Zyrtec at home, Duration: weeks Involved ear(s): right Severity:   more occasional aching   Quality:  dull and aching Fever: no Otorrhea: no Upper respiratory infection symptoms: no Pruritus: no Hearing loss: no Water immersion no Using Q-tips: no Recurrent otitis media: no Status: stable Treatments attempted: abx  Relevant past medical, surgical, family and social history reviewed and updated as indicated. Interim medical history since our last visit reviewed. Allergies and medications reviewed and updated.  Review of Systems  Constitutional:  Negative for activity change, appetite change, diaphoresis, fatigue and fever.  Respiratory:  Negative for cough, chest tightness and shortness of breath.   Cardiovascular:  Negative for chest pain, palpitations and leg swelling.  Gastrointestinal:  Positive for anal bleeding and constipation. Negative for abdominal distention, abdominal pain, blood in stool, diarrhea, nausea, rectal pain and vomiting.  Neurological: Negative.   Psychiatric/Behavioral: Negative.     Per HPI unless specifically indicated above     Objective:    BP 106/69   Pulse 88  Temp 99.9 F (37.7 C) (Oral)   Wt 183 lb 9.6 oz (83.3 kg)   SpO2 97%   BMI 32.52 kg/m   Wt Readings from Last 3 Encounters:  06/28/21 183 lb 9.6 oz (83.3 kg)  06/08/21 183 lb 3.2 oz (83.1 kg)  06/03/21 181 lb 9.6 oz (82.4 kg)    Physical Exam Vitals and nursing note reviewed. Exam conducted with a chaperone present.  Constitutional:      General: She is awake. She is not in acute distress.    Appearance: She is well-developed and well-groomed. She is obese. She is not ill-appearing or toxic-appearing.  HENT:     Head:  Normocephalic.     Right Ear: Hearing, tympanic membrane, ear canal and external ear normal.     Left Ear: Hearing, tympanic membrane, ear canal and external ear normal.  Eyes:     General: Lids are normal.        Right eye: No discharge.        Left eye: No discharge.     Conjunctiva/sclera: Conjunctivae normal.     Pupils: Pupils are equal, round, and reactive to light.  Neck:     Thyroid: No thyromegaly.     Vascular: No carotid bruit.  Cardiovascular:     Rate and Rhythm: Normal rate and regular rhythm.     Heart sounds: Normal heart sounds. No murmur heard.   No gallop.  Pulmonary:     Effort: Pulmonary effort is normal. No accessory muscle usage or respiratory distress.     Breath sounds: Normal breath sounds.  Abdominal:     General: Bowel sounds are normal. There is no distension.     Palpations: Abdomen is soft.     Tenderness: abdominal tenderness (mild) in the epigastric area  Genitourinary:    Rectum: External hemorrhoid and internal hemorrhoid present. No mass or tenderness.     Comments: X 1 small internal hemorrhoid noted to 7 o'clock aspect.  Not inflamed. Musculoskeletal:     Cervical back: Normal range of motion and neck supple.     Right lower leg: No edema.     Left lower leg: No edema.  Lymphadenopathy:     Cervical: No cervical adenopathy.  Skin:    General: Skin is warm and dry.  Neurological:     Mental Status: She is alert and oriented to person, place, and time.     Deep Tendon Reflexes: Reflexes are normal and symmetric.  Psychiatric:        Attention and Perception: Attention normal.        Mood and Affect: Mood normal.        Speech: Speech normal.        Behavior: Behavior normal. Behavior is cooperative.        Thought Content: Thought content normal.    Results for orders placed or performed in visit on 05/30/21  Cytology - PAP  Result Value Ref Range   High risk HPV Negative    Adequacy      Satisfactory for evaluation; transformation  zone component PRESENT.   Diagnosis      - Negative for intraepithelial lesion or malignancy (NILM)   Microorganisms      Fungal organisms present consistent with Candida spp.   Comment Normal Reference Range HPV - Negative       Assessment & Plan:   Problem List Items Addressed This Visit       Cardiovascular and Mediastinum   Hemorrhoid -  Primary    Acute with some bleeding and irritation secondary to constipation.  At this time Anusol external cream sent in and instructed on use.  Return to office in two weeks for follow-up.  Sitz bath as needed for pain.         Other   Constipation    Over past couple weeks.  Suspect related to poor fiber intake.  Recommend starting Metamucil daily and ensuring plenty of fluid intake at home + fiber rich foods.  Continue Miralax as needed + Senna.  Plan on return to office in 2 weeks for follow-up.       Ear pain, right    Recent otitis media treatment, overall improved at this time.  Continues to have occasional mild aching -- recommend continue use of Zyrtec and Flonase daily.         Follow up plan: Return in about 2 weeks (around 07/12/2021) for Constipation and hemorrhoid.

## 2021-06-28 NOTE — Assessment & Plan Note (Signed)
Recent otitis media treatment, overall improved at this time.  Continues to have occasional mild aching -- recommend continue use of Zyrtec and Flonase daily.

## 2021-06-28 NOTE — Assessment & Plan Note (Signed)
Acute with some bleeding and irritation secondary to constipation.  At this time Anusol external cream sent in and instructed on use.  Return to office in two weeks for follow-up.  Sitz bath as needed for pain.

## 2021-06-28 NOTE — Patient Instructions (Signed)

## 2021-07-10 ENCOUNTER — Other Ambulatory Visit: Payer: Self-pay | Admitting: Nurse Practitioner

## 2021-07-10 NOTE — Telephone Encounter (Signed)
Requested medication (s) are due for refill today: no  Requested medication (s) are on the active medication list: yes  Last refill:  06/08/21 #30 3 RF  Future visit scheduled: yes  Notes to clinic:  pharmacy requesting 90 day refills   Requested Prescriptions  Pending Prescriptions Disp Refills   citalopram (CELEXA) 20 MG tablet [Pharmacy Med Name: CITALOPRAM HBR 20 MG TABLET] 90 tablet 2    Sig: TAKE 1 TABLET BY MOUTH EVERY DAY      Psychiatry:  Antidepressants - SSRI Passed - 07/10/2021  1:31 PM      Passed - Completed PHQ-2 or PHQ-9 in the last 360 days      Passed - Valid encounter within last 6 months    Recent Outpatient Visits           1 week ago Grade I hemorrhoids   [facility] [place], Dorie Rank, NP   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

## 2021-07-20 ENCOUNTER — Ambulatory Visit: Payer: Medicaid Other | Admitting: Nurse Practitioner

## 2022-01-07 NOTE — Patient Instructions (Signed)
Dyspareunia, Female ?Dyspareunia is pain that is associated with sexual activity. This can affect any part of the genitals or lower abdomen. There are many possible causes of this condition. In some cases, diagnosing the cause of dyspareunia can be difficult. ?This condition can be mild, moderate, or severe. Depending on the cause, dyspareunia may get better with treatment, but it may return (recur) over time. ?What are the causes? ?The cause of this condition is not always known. However, problems that affect the outer female genital area (vulva), the vagina, the uterus, and other organs may cause dyspareunia. ?Common causes of this condition include: ?Vaginal dryness. ?Giving birth. ?Infection. ?Skin changes or conditions. ?Side effects of medicines. ?A condition of severe pain and tenderness of the vulva when it is touched (vulvodynia). ?Endometriosis. This is when tissue that is like the lining of the uterus grows on the outside of the uterus. ?Psychological conditions. These include depression, anxiety, or traumatic experiences. ?Allergic reaction. ?What increases the risk? ?The following factors may make you more likely to develop this condition: ?History of physical or sexual trauma. ?Some medicines. ?No longer having a monthly period (menopause). ?Having recently given birth. ?Taking baths using soaps that have perfumes. These can cause irritation. ?Douching. ?What are the signs or symptoms? ?The main symptom of this condition is pain in any part of your genitals or lower abdomen during or after sex. This may include: ?Pain and tenderness of the vulva when it is touched. ?Irritation, burning, or stinging sensations in your vulva. ?Aching and throbbing pain that may be constant. ?Pain that gets worse when something is inserted into your vagina. ?How is this diagnosed? ?This condition may be diagnosed based on: ?Your symptoms, including where and when your pain occurs. ?Your medical history. ?A physical exam.  A pelvic exam will most likely be done. ?Tests, including blood tests and tests that check the body for infection. ?Imaging tests, such as an ultrasound. ?You may be referred to a health care provider who specializes in women's health (gynecologist). ?How is this treated? ?Treatment for this condition depends on the cause of the condition and your symptoms. Treatment may include: ?Lubricants, ointments, and creams. ?Physical therapy. ?Massage therapy. ?Hormonal therapy. ?Medicines to: ?Prevent or fight infection. ?Relieve pain. ?Help numb the area. ?Treat depression (antidepressants). ?Counseling, which may include sex therapy. ?Surgery. ?In most cases, you may need to stop sexual activity until your symptoms go away or get better. ?Follow these instructions at home: ?Lifestyle ?Wear cotton underwear. ?Use water-based lubricants as needed during sex. Avoid oil-based lubricants. ?Do not use any products that can cause irritation. This may include certain condoms, spermicides, lubricants, soaps, tampons, vaginal sprays, or douches. ?Always practice safe sex. Talk to your health care provider about how to prevent sexually transmitted infections with this condition. ?Talk freely with your partner about your condition. ?General instructions ?Take over-the-counter and prescription medicines only as told by your health care provider. ?Urinate before you have sex. ?Consider joining a support group. ?It is up to you to get the results of any tests you have done. Ask your health care provider, or the department that is doing the tests, when your results will be ready. ?Keep all follow-up visits. This is important. ?Contact a health care provider if: ?You have vaginal bleeding after having sex. ?You develop a lump at the opening of your vagina even if the lump is painless. ?You have symptoms that get worse or do not improve with treatment. ?You have: ?Abnormal discharge from your vagina. ?  Vaginal dryness. ?Itchiness or  irritation of your vulva or vagina. ?You develop a new rash. ?You have a fever. ?You have pain when you urinate or blood in your urine. ?Summary ?Dyspareunia is pain that is associated with sexual activity. This can affect any part of the genitals or lower abdomen. ?There are many causes of this condition. Treatment depends on the cause and your symptoms. In most cases, you may need to stop sexual activity until your symptoms improve. ?Take over-the-counter and prescription medicines only as told by your health care provider. ?Contact a health care provider if your symptoms get worse or do not improve with treatment. ?Keep all follow-up visits. This is important. ?This information is not intended to replace advice given to you by your health care provider. Make sure you discuss any questions you have with your health care provider. ?Document Revised: 03/29/2021 Document Reviewed: 03/29/2021 ?Elsevier Patient Education ? 2022 [employer] ? ?

## 2022-01-09 ENCOUNTER — Encounter: Payer: Self-pay | Admitting: Nurse Practitioner

## 2022-01-09 ENCOUNTER — Other Ambulatory Visit: Payer: Self-pay

## 2022-01-09 ENCOUNTER — Ambulatory Visit (INDEPENDENT_AMBULATORY_CARE_PROVIDER_SITE_OTHER): Payer: Medicaid Other | Admitting: Nurse Practitioner

## 2022-01-09 VITALS — BP 106/65 | HR 85 | Temp 98.2°F | Ht 63.0 in | Wt 177.2 lb

## 2022-01-09 DIAGNOSIS — B3731 Acute candidiasis of vulva and vagina: Secondary | ICD-10-CM

## 2022-01-09 DIAGNOSIS — R102 Pelvic and perineal pain: Secondary | ICD-10-CM | POA: Insufficient documentation

## 2022-01-09 LAB — URINALYSIS, ROUTINE W REFLEX MICROSCOPIC
Bilirubin, UA: NEGATIVE
Glucose, UA: NEGATIVE
Ketones, UA: NEGATIVE
Leukocytes,UA: NEGATIVE
Nitrite, UA: NEGATIVE
Protein,UA: NEGATIVE
RBC, UA: NEGATIVE
Specific Gravity, UA: >1.03 — ABNORMAL HIGH (ref 1.005–1.030)
Urobilinogen, Ur: 0.2 mg/dL (ref 0.2–1.0)
pH, UA: 5.5 (ref 5.0–7.5)

## 2022-01-09 LAB — WET PREP FOR TRICH, YEAST, CLUE
Clue Cell Exam: NEGATIVE
Trichomonas Exam: NEGATIVE
Yeast Exam: POSITIVE — AB

## 2022-01-09 MED ORDER — FLUCONAZOLE 150 MG PO TABS: 150.0000 mg | ORAL_TABLET | Freq: Once | ORAL | 0 refills | Status: AC

## 2022-01-09 NOTE — Progress Notes (Signed)
BP 106/65    Pulse 85    Temp 98.2 F (36.8 C) (Oral)    Ht 5\' 3"  (1.6 m)    Wt 177 lb 3.2 oz (80.4 kg)    SpO2 98%    BMI 31.39 kg/m    Subjective:    Patient ID: Danielle Gillespie, female    DOB: 02-18-1991, 31 y.o.   MRN: [id number]  HPI: Danielle Gillespie is a 31 y.o. female  Chief Complaint  Patient presents with   Vaginal Pain    Patient states for the past 3 months she has noticed pain at the opening of vagina, patient notices some pain and she says the best way to describe it is after she gave birth and was healing with the stitches. Patient states she uses lubricant when she is sexually active. Patient denies trying any over the counter medication. Patient states when she is being penetrated and when it comes out is when it is painful.    VAGINAL PAIN Has been having discomfort vaginally for 3 months -- she thought it was due to using tampons and irritation, but this has been ongoing.  She notices this pain upon penetration and upon coming out -- feels like having sex after giving birth.    Has stopped using tampons and is using lubricant for intercourse, which helps some.  Does not have rough intercourse.  Since having children she reports her clitoris has never been the same and is more tender at baseline.  Had two tears with pregnancy one to clitoris and one to side where stitching took place (right side).  She does have GYN -- [facility].   Duration: months Discharge description: clear  Pruritus: no Dysuria: no Malodorous: no Urinary frequency: no Fevers: no Abdominal pain: no  Sexual activity: monogamous History of sexually transmitted diseases:  Chlamydia in past Recent antibiotic use: no Context: stable Treatments attempted: lubricant and avoiding tampons   Relevant past medical, surgical, family and social history reviewed and updated as indicated. Interim medical history since our last visit reviewed. Allergies and medications reviewed and updated.  Review of Systems   Constitutional:  Negative for activity change, appetite change, diaphoresis, fatigue and fever.  Respiratory:  Negative for cough, chest tightness and shortness of breath.   Cardiovascular:  Negative for chest pain, palpitations and leg swelling.  Gastrointestinal: Negative.   Genitourinary:  Positive for dyspareunia. Negative for menstrual problem and pelvic pain.  Neurological: Negative.   Psychiatric/Behavioral: Negative.     Per HPI unless specifically indicated above     Objective:    BP 106/65    Pulse 85    Temp 98.2 F (36.8 C) (Oral)    Ht 5\' 3"  (1.6 m)    Wt 177 lb 3.2 oz (80.4 kg)    SpO2 98%    BMI 31.39 kg/m   Wt Readings from Last 3 Encounters:  01/09/22 177 lb 3.2 oz (80.4 kg)  06/28/21 183 lb 9.6 oz (83.3 kg)  06/08/21 183 lb 3.2 oz (83.1 kg)    Physical Exam Vitals and nursing note reviewed. Exam conducted with a chaperone present.  Constitutional:      General: She is awake. She is not in acute distress.    Appearance: She is well-developed and well-groomed. She is obese. She is not ill-appearing or toxic-appearing.  HENT:     Head: Normocephalic.     Right Ear: Hearing normal.     Left Ear: Hearing normal.  Eyes:  General: Lids are normal.        Right eye: No discharge.        Left eye: No discharge.     Conjunctiva/sclera: Conjunctivae normal.     Pupils: Pupils are equal, round, and reactive to light.  Neck:     Thyroid: No thyromegaly.     Vascular: No carotid bruit.  Cardiovascular:     Rate and Rhythm: Normal rate and regular rhythm.     Heart sounds: Normal heart sounds. No murmur heard.   No gallop.  Pulmonary:     Effort: Pulmonary effort is normal. No accessory muscle usage or respiratory distress.     Breath sounds: Normal breath sounds.  Abdominal:     General: Bowel sounds are normal.     Palpations: Abdomen is soft. There is no hepatomegaly or splenomegaly.     Hernia: There is no hernia in the left inguinal area or right inguinal  area.  Genitourinary:    Exam position: Lithotomy position.     Pubic Area: No rash.      Labia:        Right: No rash.        Left: No rash.      Vagina: Normal.     Cervix: Normal.     Comments: Mild erythema and scar tissue to clitoral area. Musculoskeletal:     Cervical back: Normal range of motion and neck supple.     Right lower leg: No edema.     Left lower leg: No edema.  Lymphadenopathy:     Cervical: No cervical adenopathy.  Skin:    General: Skin is warm and dry.  Neurological:     Mental Status: She is alert and oriented to person, place, and time.     Deep Tendon Reflexes: Reflexes are normal and symmetric.     Reflex Scores:      Brachioradialis reflexes are 2+ on the right side and 2+ on the left side.      Patellar reflexes are 2+ on the right side and 2+ on the left side. Psychiatric:        Attention and Perception: Attention normal.        Mood and Affect: Mood normal.        Speech: Speech normal.        Behavior: Behavior normal. Behavior is cooperative.        Thought Content: Thought content normal.   Results for orders placed or performed in visit on 01/09/22  WET PREP FOR [place], YEAST, CLUE   Urine  Result Value Ref Range   Trichomonas Exam Negative Negative   Yeast Exam Positive (A) Negative   Clue Cell Exam Negative Negative  Urinalysis, Routine w reflex microscopic  Result Value Ref Range   Specific Gravity, UA >1.030 (H) 1.005 - 1.030   pH, UA 5.5 5.0 - 7.5   Color, UA Yellow Yellow   Appearance Ur Clear Clear   Leukocytes,UA Negative Negative   Protein,UA Negative Negative/Trace   Glucose, UA Negative Negative   Ketones, UA Negative Negative   RBC, UA Negative Negative   Bilirubin, UA Negative Negative   Urobilinogen, Ur 0.2 0.2 - 1.0 mg/dL   Nitrite, UA Negative Negative      Assessment & Plan:   Problem List Items Addressed This Visit       Other   Vaginal pain - Primary    Ongoing for 3 months, with history of trauma to  clitoral  area and vaginal wall during delivery in past.  UA negative.  Wet prep + for yeast, will treat with Diflucan.  Suspect a mix of pelvic floor issues and possible scar tissue.  Recommend she follow-up with GYN, may need change in BCP or alternate lubrication.  Referral to PT for pelvic floor exercises.  Follow-up in 4 weeks.      Relevant Orders   Urinalysis, Routine w reflex microscopic (Completed)   WET PREP FOR [place]ICH, YEAST, CLUE   GC/Chlamydia Probe Amp   Ambulatory referral to Physical Therapy   Other Visit Diagnoses     Vaginal yeast infection       Diflucan sent in for patient.   Relevant Medications   fluconazole (DIFLUCAN) 150 MG tablet        Follow up plan: Return in about 4 weeks (around 02/06/2022) for Vaginal pain.

## 2022-01-09 NOTE — Assessment & Plan Note (Addendum)
Ongoing for 3 months, with history of trauma to clitoral area and vaginal wall during delivery in past.  UA negative.  Wet prep + for yeast, will treat with Diflucan.  Suspect a mix of pelvic floor issues and possible scar tissue.  Recommend she follow-up with GYN, may need change in BCP or alternate lubrication.  Referral to PT for pelvic floor exercises.  Follow-up in 4 weeks.

## 2022-01-11 LAB — GC/CHLAMYDIA PROBE AMP
Chlamydia trachomatis, NAA: NEGATIVE
Neisseria Gonorrhoeae by PCR: NEGATIVE

## 2022-01-11 NOTE — Progress Notes (Signed)
Contacted via MyChart   Urine testing all negative:)

## 2022-01-25 ENCOUNTER — Ambulatory Visit: Payer: Medicaid Other | Admitting: Nurse Practitioner

## 2022-02-06 ENCOUNTER — Encounter: Payer: Medicaid Other | Admitting: Nurse Practitioner

## 2022-02-06 ENCOUNTER — Other Ambulatory Visit: Payer: Self-pay

## 2022-02-06 VITALS — Wt 174.2 lb

## 2022-02-07 NOTE — Progress Notes (Signed)
Erroneous

## 2022-02-16 ENCOUNTER — Telehealth: Payer: Self-pay

## 2022-02-16 ENCOUNTER — Ambulatory Visit: Payer: Medicaid Other | Admitting: Nurse Practitioner

## 2022-02-16 NOTE — Telephone Encounter (Signed)
Transition Care Management Unsuccessful Follow-up Telephone Call ? ?Date of discharge and from where:  02/15/2022-[facility]  ? ?Attempts:  1st Attempt ? ?Reason for unsuccessful TCM follow-up call:  Left voice message ? ?  ?

## 2022-02-17 NOTE — Telephone Encounter (Signed)
Transition Care Management Unsuccessful Follow-up Telephone Call ? ?Date of discharge and from where:  02/15/2022-[facility] ? ?Attempts:  2nd Attempt ? ?Reason for unsuccessful TCM follow-up call:  Left voice message ? ?  ?

## 2022-02-21 NOTE — Telephone Encounter (Signed)
Transition Care Management Unsuccessful Follow-up Telephone Call ? ?Date of discharge and from where:  02/15/2022-[facility] ? ?Attempts:  3rd Attempt ? ?Reason for unsuccessful TCM follow-up call:  Left voice message ? ?  ?

## 2022-02-26 NOTE — Patient Instructions (Signed)
Scabies, Adult °Scabies is a skin condition that happens when very small insects called mites get under the skin (infestation). This causes a rash and severe itchiness. Scabies is contagious, which means it can spread from person to person. If you get scabies, it is common for others in your household to get scabies too. °With proper treatment, symptoms usually go away in 2-4 weeks. Scabies usually does not cause lasting problems. °What are the causes? °This condition is caused by tiny mites (Sarcoptes scabiei, or human itch mites) that can only be seen with a microscope. The mites get into the top layer of skin and lay eggs. Scabies can spread from person to person through: °Close contact with a person who has scabies. °Sharing or having contact with infested items, such as towels, bedding, or clothing. °What increases the risk? °The following factors may make you more likely to develop this condition: °Living in a nursing home or other extended care facility. °Having sexual contact with a partner who has scabies. °Caring for others who are at increased risk for scabies. °What are the signs or symptoms? °Symptoms of this condition include: °Severe itchiness. This is often worse at night. °A rash that includes tiny red bumps or blisters. The rash commonly occurs on the hands, wrists, elbows, armpits, chest, waist, groin, or buttocks. The bumps may form a line (burrow) in some areas. °Skin irritation. This can include scaly patches or sores. °How is this diagnosed? °This condition may be diagnosed based on: °A physical exam of the skin. °A skin test. Your health care provider may take a sample of your affected skin (skin scraping) and have it examined under a microscope for signs of mites. °How is this treated? °This condition may be treated with: °Medicated cream or lotion that kills the mites. This is spread on the entire body and left on for several hours. Usually, one treatment with medicated cream or lotion is  enough to kill all the mites. In severe cases, the treatment may need to be repeated. °Medicated cream that relieves itching. °Medicines taken by mouth (orally) that: °Relieve itching. °Reduce the swelling and redness. °Kill the mites. This treatment may be done in severe cases. °Follow these instructions at home: °Medicines °Take or apply over-the-counter and prescription medicines only as told by your health care provider. °Apply medicated cream or lotion as told by your health care provider. °Do not wash off the medicated cream or lotion until the necessary amount of time has passed. °Skin care °Avoid scratching the affected areas of your skin. °Keep your fingernails closely trimmed to reduce injury from scratching. °Take cool baths or apply cool washcloths to your skin to help reduce itching. °General instructions °Clean all items that you had contact with during the 3 days before diagnosis. This includes bedding, clothing, towels, and furniture. Do this on the same day that you start treatment. °Dry-clean items, or use hot water to wash items. Dry items on the hot dry cycle. °Place items that cannot be washed into closed, airtight plastic bags for at least 3 days. The mites cannot live for more than 3 days away from human skin. °Vacuum furniture and mattresses that you use. °Make sure that other people who may have been infested are examined by a health care provider. These include members of your household and anyone who may have had contact with infested items. °Keep all follow-up visits. This is important. °Where to find more information °Centers for Disease Control and Prevention: www.cdc.gov °Contact a health   care provider if: °You have itching that does not go away after 4 weeks of treatment. °You continue to develop new bumps or burrows. °You have redness, swelling, or pain in your rash area after treatment. °You have fluid, blood, or pus coming from your rash. °Summary °Scabies is a skin condition that  causes a rash and severe itchiness. °This condition is caused by tiny mites that get into the top layer of the skin and lay eggs. °Scabies can spread from person to person. °Follow treatments as recommended by your health care provider. °Clean all items that you recently had contact with. °This information is not intended to replace advice given to you by your health care provider. Make sure you discuss any questions you have with your health care provider. °Document Revised: 03/19/2020 Document Reviewed: 03/19/2020 °Elsevier Patient Education © 2022 [employer] ° °

## 2022-02-28 ENCOUNTER — Encounter: Payer: Self-pay | Admitting: Nurse Practitioner

## 2022-02-28 ENCOUNTER — Ambulatory Visit (INDEPENDENT_AMBULATORY_CARE_PROVIDER_SITE_OTHER): Payer: Medicaid Other | Admitting: Nurse Practitioner

## 2022-02-28 ENCOUNTER — Other Ambulatory Visit: Payer: Self-pay

## 2022-02-28 VITALS — BP 105/70 | HR 93 | Temp 98.2°F | Ht 63.0 in | Wt 174.2 lb

## 2022-02-28 DIAGNOSIS — R21 Rash and other nonspecific skin eruption: Secondary | ICD-10-CM | POA: Diagnosis not present

## 2022-02-28 DIAGNOSIS — E538 Deficiency of other specified B group vitamins: Secondary | ICD-10-CM | POA: Diagnosis not present

## 2022-02-28 DIAGNOSIS — D729 Disorder of white blood cells, unspecified: Secondary | ICD-10-CM

## 2022-02-28 MED ORDER — NYSTATIN 100000 UNIT/GM EX CREA: 1.0000 "application " | TOPICAL_CREAM | Freq: Two times a day (BID) | CUTANEOUS | 4 refills | Status: DC

## 2022-02-28 MED ORDER — KETOCONAZOLE 2 % EX SHAM: MEDICATED_SHAMPOO | CUTANEOUS | 1 refills | Status: DC

## 2022-02-28 NOTE — Progress Notes (Signed)
? ?BP 105/70   Pulse 93   Temp 98.2 ?F (36.8 ?C) (Oral)   Ht 5' 3"  (1.6 m)   Wt 174 lb 3.2 oz (79 kg)   SpO2 98%   BMI 30.86 kg/m?   ? ?Subjective:  ? ? Patient ID: Danielle Gillespie, female    DOB: [birth year], 31 y.o.   MRN: [id number] ? ?HPI: ?Danielle Gillespie is a 32 y.o. female ? ?Chief Complaint  ?Patient presents with  ? Rash  ?  Patient states she has gotten better since her hospitalization. Patient states she still has spots that come up here and there. Patient states they diagnosed her with scabies or fungal infection, but they did prescribed her medications to treat. Patient states she has noticed since the birth of her daughter, when she gets out the shower she noticed red burns on her face and it takes about 2-3 hours to go away and the last flare lasted 8 hours. Patient states it is always her face.   ? ?RASH ?Seen in [facility] ER on 02/15/22 for rash -- treated for both scabies and possible fungal infection.  They placed her on Permethrin cream, which she has done twice, and started Ketoconazole shampoo.  She reports this is improving rash, but still has occasional bumps popping up.  No one else in household has any rashes. ? ?Rash initially started on 02/11/22.  She did have one mark on leg, she forgot to mention to ER, that looked at ring worm -- this was first presentation. ? ?Also has facial redness after showering with blotching, not currently using any facial products.  History of low B12 on labs, is taking supplement at times. ?Duration:  weeks  ?Location: generalized  ?Itching: yes ?Burning: no ?Redness: yes ?Oozing: no ?Scaling: no ?Blisters: no ?Painful: no ?Fevers: no ?Change in detergents/soaps/personal care products: no ?Recent illness: no ?Recent travel: in January went to [place] and stayed in hotel ?History of same: no ?Context: stable ?Alleviating factors: as above ?Treatments attempted: as above ?Shortness of breath: no  ?Throat/tongue swelling: no ?Myalgias/arthralgias: no  ? ?Relevant past  medical, surgical, family and social history reviewed and updated as indicated. Interim medical history since our last visit reviewed. ?Allergies and medications reviewed and updated. ? ?Review of Systems  ?Constitutional:  Negative for activity change, appetite change, diaphoresis, fatigue and fever.  ?Respiratory:  Negative for cough, chest tightness and shortness of breath.   ?Cardiovascular:  Negative for chest pain, palpitations and leg swelling.  ?Gastrointestinal: Negative.   ?Skin:  Positive for rash.  ?Neurological: Negative.   ?Psychiatric/Behavioral:  Negative for decreased concentration, self-injury and suicidal ideas.   ? ?Per HPI unless specifically indicated above ? ?   ?Objective:  ?  ?BP 105/70   Pulse 93   Temp 98.2 ?F (36.8 ?C) (Oral)   Ht 5' 3"  (1.6 m)   Wt 174 lb 3.2 oz (79 kg)   SpO2 98%   BMI 30.86 kg/m?   ?Wt Readings from Last 3 Encounters:  ?02/28/22 174 lb 3.2 oz (79 kg)  ?02/06/22 174 lb 3.2 oz (79 kg)  ?01/09/22 177 lb 3.2 oz (80.4 kg)  ?  ?Physical Exam ?Vitals and nursing note reviewed.  ?Constitutional:   ?   General: She is awake. She is not in acute distress. ?   Appearance: She is well-developed and well-groomed. She is obese. She is not ill-appearing or toxic-appearing.  ?HENT:  ?   Head: Normocephalic.  ?   Right Ear: Hearing normal.  ?  Left Ear: Hearing normal.  ?   Nose: Nose normal.  ?Eyes:  ?   General: Lids are normal.     ?   Right eye: No discharge.     ?   Left eye: No discharge.  ?   Conjunctiva/sclera: Conjunctivae normal.  ?   Pupils: Pupils are equal, round, and reactive to light.  ?Neck:  ?   Thyroid: No thyromegaly.  ?   Vascular: No carotid bruit.  ?Cardiovascular:  ?   Rate and Rhythm: Normal rate and regular rhythm.  ?   Heart sounds: Normal heart sounds. No murmur heard. ?  No gallop.  ?Pulmonary:  ?   Effort: Pulmonary effort is normal. No accessory muscle usage or respiratory distress.  ?   Breath sounds: Normal breath sounds.  ?Abdominal:  ?    General: Bowel sounds are normal.  ?   Palpations: Abdomen is soft. There is no hepatomegaly or splenomegaly.  ?Musculoskeletal:  ?   Cervical back: Normal range of motion and neck supple.  ?   Right lower leg: No edema.  ?   Left lower leg: No edema.  ?Skin: ?   General: Skin is warm and dry.  ?   Findings: Rash present.  ?   Comments: Pale, round, pink erythema to back of left calf with central clearing. Scattered red spots noted to arms.  ?Neurological:  ?   Mental Status: She is alert and oriented to person, place, and time.  ?Psychiatric:     ?   Attention and Perception: Attention normal.     ?   Mood and Affect: Mood normal.     ?   Speech: Speech normal.     ?   Behavior: Behavior normal. Behavior is cooperative.     ?   Thought Content: Thought content normal.  ? ? ?Results for orders placed or performed in visit on 01/09/22  ?GC/Chlamydia Probe Amp  ? Specimen: Urine  ? UR  ?Result Value Ref Range  ? Chlamydia trachomatis, NAA Negative Negative  ? Neisseria Gonorrhoeae by PCR Negative Negative  ?WET PREP FOR [place], YEAST, CLUE  ? Urine  ?Result Value Ref Range  ? Trichomonas Exam Negative Negative  ? Yeast Exam Positive (A) Negative  ? Clue Cell Exam Negative Negative  ?Urinalysis, Routine w reflex microscopic  ?Result Value Ref Range  ? Specific Gravity, UA >1.030 (H) 1.005 - 1.030  ? pH, UA 5.5 5.0 - 7.5  ? Color, UA Yellow Yellow  ? Appearance Ur Clear Clear  ? Leukocytes,UA Negative Negative  ? Protein,UA Negative Negative/Trace  ? Glucose, UA Negative Negative  ? Ketones, UA Negative Negative  ? RBC, UA Negative Negative  ? Bilirubin, UA Negative Negative  ? Urobilinogen, Ur 0.2 0.2 - 1.0 mg/dL  ? Nitrite, UA Negative Negative  ? ?   ?Assessment & Plan:  ? ?Problem List Items Addressed This Visit   ? ?  ? Musculoskeletal and Integument  ? Rash - Primary  ?  Suspect this is more fungal -- ?nummular dermatitis.  Continue Ketoconazole and send in Nystatin cream to use as needed.  Will place referral to  dermatology for further assessment as has some facial erythema and blotching noted after showers too (?irritant dermatitis or eczema).  Labs today. ?  ?  ? Relevant Orders  ? Ambulatory referral to Dermatology  ? ANA w/Reflex if Positive  ? C-reactive protein  ? Sed Rate (ESR)  ? CBC with Differential/Platelet  ?  ?  Other  ? Neutrophilia  ?  Recheck CBC on labs today along with ANA, CRP, ESR. ?  ?  ? Vitamin B12 deficiency  ?  Noted on past labs with reported history of anemia -- she is taking supplement at this time.  Continue this and check level today. ?  ?  ? Relevant Orders  ? Vitamin B12  ?  ?Time: 25 minutes, >50% spent counseling/or care coordination  ? ?Follow up plan: ?Return in about 2 months (around 05/08/2022) for Annual physical. ? ? ? ? ? ?

## 2022-02-28 NOTE — Assessment & Plan Note (Signed)
Noted on past labs with reported history of anemia -- she is taking supplement at this time.  Continue this and check level today. ?

## 2022-02-28 NOTE — Assessment & Plan Note (Signed)
Recheck CBC on labs today along with ANA, CRP, ESR. ?

## 2022-02-28 NOTE — Assessment & Plan Note (Signed)
Suspect this is more fungal -- ?nummular dermatitis.  Continue Ketoconazole and send in Nystatin cream to use as needed.  Will place referral to dermatology for further assessment as has some facial erythema and blotching noted after showers too (?irritant dermatitis or eczema).  Labs today. ?

## 2022-03-01 ENCOUNTER — Encounter: Payer: Self-pay | Admitting: Nurse Practitioner

## 2022-03-01 DIAGNOSIS — D729 Disorder of white blood cells, unspecified: Secondary | ICD-10-CM

## 2022-03-01 LAB — CBC WITH DIFFERENTIAL/PLATELET
Basophils Absolute: 0.1 10*3/uL (ref 0.0–0.2)
Basos: 0 %
EOS (ABSOLUTE): 0.2 10*3/uL (ref 0.0–0.4)
Eos: 1 %
Hematocrit: 39.2 % (ref 34.0–46.6)
Hemoglobin: 13.5 g/dL (ref 11.1–15.9)
Immature Grans (Abs): 0 10*3/uL (ref 0.0–0.1)
Immature Granulocytes: 0 %
Lymphocytes Absolute: 2.8 10*3/uL (ref 0.7–3.1)
Lymphs: 23 %
MCH: 31.3 pg (ref 26.6–33.0)
MCHC: 34.4 g/dL (ref 31.5–35.7)
MCV: 91 fL (ref 79–97)
Monocytes Absolute: 0.7 10*3/uL (ref 0.1–0.9)
Monocytes: 6 %
Neutrophils Absolute: 8.4 10*3/uL — ABNORMAL HIGH (ref 1.4–7.0)
Neutrophils: 70 %
Platelets: 414 10*3/uL (ref 150–450)
RBC: 4.31 x10E6/uL (ref 3.77–5.28)
RDW: 12.3 % (ref 11.7–15.4)
WBC: 12.2 10*3/uL — ABNORMAL HIGH (ref 3.4–10.8)

## 2022-03-01 LAB — ANA W/REFLEX IF POSITIVE: Anti Nuclear Antibody (ANA): NEGATIVE

## 2022-03-01 LAB — VITAMIN B12: Vitamin B-12: 312 pg/mL (ref 232–1245)

## 2022-03-01 LAB — C-REACTIVE PROTEIN: CRP: 10 mg/L (ref 0–10)

## 2022-03-01 LAB — SEDIMENTATION RATE: Sed Rate: 15 mm/hr (ref 0–32)

## 2022-03-01 NOTE — Progress Notes (Signed)
Contacted via [place] ? ? ?Good evening [place], with the ongoing mild elevation in white blood cell count I do think it would be good to see hematology and will place this referral.  It is mild elevation only, but this has been ongoing -- you have had only one CBC since 2021 not showing elevation in white blood cell count (some of these you may have been ill at time).  The inflammatory labs are all normal.  Any questions? ?Keep being amazing!!  Thank you for allowing me to participate in your care.  I appreciate you. ?Kindest regards, ?Lumen Brinlee ?

## 2022-03-07 ENCOUNTER — Encounter: Payer: Self-pay | Admitting: Nurse Practitioner

## 2022-03-08 ENCOUNTER — Other Ambulatory Visit: Payer: Medicaid Other

## 2022-03-08 ENCOUNTER — Other Ambulatory Visit: Payer: Self-pay | Admitting: Nurse Practitioner

## 2022-03-08 DIAGNOSIS — M545 Low back pain, unspecified: Secondary | ICD-10-CM

## 2022-03-08 LAB — URINALYSIS, ROUTINE W REFLEX MICROSCOPIC
Bilirubin, UA: NEGATIVE
Glucose, UA: NEGATIVE
Leukocytes,UA: NEGATIVE
Nitrite, UA: NEGATIVE
Protein,UA: NEGATIVE
RBC, UA: NEGATIVE
Specific Gravity, UA: 1.025 (ref 1.005–1.030)
Urobilinogen, Ur: 0.2 mg/dL (ref 0.2–1.0)
pH, UA: 6 (ref 5.0–7.5)

## 2022-03-08 LAB — WET PREP FOR TRICH, YEAST, CLUE
Clue Cell Exam: NEGATIVE
Trichomonas Exam: NEGATIVE
Yeast Exam: POSITIVE — AB

## 2022-03-08 MED ORDER — FLUCONAZOLE 150 MG PO TABS: 150.0000 mg | ORAL_TABLET | Freq: Once | ORAL | 0 refills | Status: AC

## 2022-03-08 NOTE — Progress Notes (Signed)
Contacted via MyChart ? ? ?Good afternoon [place], your labs have returned.  Urine overall does not show concern for infection.  You do have a trace of ketones, so I would drink more fluid at home.  Wet prep does show some yeast though.  I will send in some Diflucan for this and see if back pain improves.  Will see you at virtual visit on Friday:) ?Keep being awesome!!  Thank you for allowing me to participate in your care.  I appreciate you. ?Kindest regards, ?Brittania Sudbeck ?

## 2022-03-10 ENCOUNTER — Telehealth: Payer: Medicaid Other | Admitting: Nurse Practitioner

## 2022-03-15 ENCOUNTER — Inpatient Hospital Stay: Payer: Medicaid Other

## 2022-03-15 ENCOUNTER — Encounter: Payer: Self-pay | Admitting: Oncology

## 2022-03-15 ENCOUNTER — Inpatient Hospital Stay: Payer: Medicaid Other | Attending: Oncology | Admitting: Oncology

## 2022-03-15 VITALS — BP 103/81 | HR 94 | Temp 97.3°F | Resp 18 | Ht 63.0 in | Wt 174.0 lb

## 2022-03-15 DIAGNOSIS — F17290 Nicotine dependence, other tobacco product, uncomplicated: Secondary | ICD-10-CM

## 2022-03-15 DIAGNOSIS — D729 Disorder of white blood cells, unspecified: Secondary | ICD-10-CM

## 2022-03-15 DIAGNOSIS — D72829 Elevated white blood cell count, unspecified: Secondary | ICD-10-CM | POA: Diagnosis not present

## 2022-03-15 DIAGNOSIS — Z79899 Other long term (current) drug therapy: Secondary | ICD-10-CM | POA: Insufficient documentation

## 2022-03-15 LAB — HIV ANTIBODY (ROUTINE TESTING W REFLEX): HIV Screen 4th Generation wRfx: NONREACTIVE

## 2022-03-15 LAB — CBC WITH DIFFERENTIAL/PLATELET
Abs Immature Granulocytes: 0.02 10*3/uL (ref 0.00–0.07)
Basophils Absolute: 0.1 10*3/uL (ref 0.0–0.1)
Basophils Relative: 1 %
Eosinophils Absolute: 0.1 10*3/uL (ref 0.0–0.5)
Eosinophils Relative: 1 %
HCT: 41.8 % (ref 36.0–46.0)
Hemoglobin: 14.2 g/dL (ref 12.0–15.0)
Immature Granulocytes: 0 %
Lymphocytes Relative: 23 %
Lymphs Abs: 2 10*3/uL (ref 0.7–4.0)
MCH: 31.2 pg (ref 26.0–34.0)
MCHC: 34 g/dL (ref 30.0–36.0)
MCV: 91.9 fL (ref 80.0–100.0)
Monocytes Absolute: 0.4 10*3/uL (ref 0.1–1.0)
Monocytes Relative: 5 %
Neutro Abs: 6.1 10*3/uL (ref 1.7–7.7)
Neutrophils Relative %: 70 %
Platelets: 397 10*3/uL (ref 150–400)
RBC: 4.55 MIL/uL (ref 3.87–5.11)
RDW: 12.2 % (ref 11.5–15.5)
WBC: 8.7 10*3/uL (ref 4.0–10.5)
nRBC: 0 % (ref 0.0–0.2)

## 2022-03-15 LAB — TECHNOLOGIST SMEAR REVIEW
Plt Morphology: NORMAL
RBC MORPHOLOGY: NORMAL
WBC MORPHOLOGY: NORMAL

## 2022-03-15 LAB — HEPATITIS PANEL, ACUTE
HCV Ab: NONREACTIVE
Hep A IgM: NONREACTIVE
Hep B C IgM: NONREACTIVE
Hepatitis B Surface Ag: NONREACTIVE

## 2022-03-15 NOTE — Progress Notes (Signed)
?Hematology/Oncology Consult note ?[phone] [phone] ?  ? ?   ? ? ?Patient Care Team: ?Marjie Skiff, NP as PCP - General (Nurse Practitioner) ? ?REFERRING PROVIDER: ?Marjie Skiff, NP  ?CHIEF COMPLAINTS/REASON FOR VISIT:  ?Evaluation of neutrophilia ? ? ?HISTORY OF PRESENTING ILLNESS:  ? ?Danielle Gillespie is a  31 y.o.  female with PMH listed below was seen in consultation at the request of  Marjie Skiff, NP  for evaluation of ?Neutrophilia, ?Patient has chronic leukocytosis since at least 2021. ?04/27/2021, CBC showed a white count of 12.1, absolute neutrophil 8.3 ?02/28/2022, WBC 12.2, absolute neutrophil 8.4. ?Patient was referred to establish care with hematology for further evaluation ?She denies any constitutional symptoms ?She is a former cigarette smoker, switched to vaping since last year.  Very occasionally she uses marijuana.  Last use was a month ago. ? ? ?Review of Systems  ?Constitutional:  Negative for appetite change, chills, fatigue and fever.  ?HENT:   Negative for hearing loss and voice change.   ?Eyes:  Negative for eye problems.  ?Respiratory:  Negative for chest tightness and cough.   ?Cardiovascular:  Negative for chest pain.  ?Gastrointestinal:  Negative for abdominal distention, abdominal pain and blood in stool.  ?Endocrine: Negative for hot flashes.  ?Genitourinary:  Negative for difficulty urinating and frequency.   ?Musculoskeletal:  Negative for arthralgias.  ?Skin:  Negative for itching and rash.  ?Neurological:  Negative for extremity weakness.  ?Hematological:  Negative for adenopathy.  ?Psychiatric/Behavioral:  Negative for confusion.   ? ?MEDICAL HISTORY:  ?Past Medical History:  ?Diagnosis Date  ? Anxiety   ? Depression   ? ? ?SURGICAL HISTORY: ?Past Surgical History:  ?Procedure Laterality Date  ? NO PAST SURGERIES    ? ? ?SOCIAL HISTORY: ?Social History  ? ?Socioeconomic History  ? Marital status: Significant Other  ?  Spouse name: Not on  file  ? Number of children: Not on file  ? Years of education: Not on file  ? Highest education level: Not on file  ?Occupational History  ? Not on file  ?Tobacco Use  ? Smoking status: Every Day  ?  Types: E-cigarettes  ? Smokeless tobacco: Never  ? Tobacco comments:  ?  09/16/20 pt states she smoked half a cigarrete yesterday.  ?  Stopped cigarettes in 2022  ?Vaping Use  ? Vaping Use: Every day  ? Substances: Nicotine, Flavoring  ?Substance and Sexual Activity  ? Alcohol use: No  ? Drug use: Not Currently  ?  Types: Marijuana  ?  Comment: one time smoke marijuana end March 2023  ? Sexual activity: Yes  ?  Partners: Male  ?  Birth control/protection: Pill  ?Other Topics Concern  ? Not on file  ?Social History Narrative  ? Not on file  ? ?Social Determinants of Health  ? ?Financial Resource Strain: Low Risk   ? Difficulty of Paying Living Expenses: Not hard at all  ?Food Insecurity: No Food Insecurity  ? Worried About Programme researcher, broadcasting/film/video in the Last Year: Never true  ? Ran Out of Food in the Last Year: Never true  ?Transportation Needs: No Transportation Needs  ? Lack of Transportation (Medical): No  ? Lack of Transportation (Non-Medical): No  ?Physical Activity: Inactive  ? Days of Exercise per Week: 0 days  ? Minutes of Exercise per Session: 0 min  ?Stress: Stress Concern Present  ? Feeling of Stress : To some extent  ?Social Connections: Moderately Isolated  ?  Frequency of Communication with Friends and Family: Three times a week  ? Frequency of Social Gatherings with Friends and Family: Three times a week  ? Attends Religious Services: Never  ? Active Member of Clubs or Organizations: No  ? Attends BankerClub or Organization Meetings: Never  ? Marital Status: Living with partner  ?Intimate Partner Violence: Not At Risk  ? Fear of Current or Ex-Partner: No  ? Emotionally Abused: No  ? Physically Abused: No  ? Sexually Abused: No  ? ? ?FAMILY HISTORY: ?Family History  ?Problem Relation Age of Onset  ? Depression Mother    ? Diabetes Maternal Grandmother   ? Bipolar disorder Maternal Grandmother   ? Heart disease Maternal Grandmother   ? Depression Maternal Grandmother   ? Healthy Father   ? Dementia Paternal Grandmother   ? Depression Sister   ? Depression Brother   ? ? ?ALLERGIES:  has No Known Allergies. ? ?MEDICATIONS:  ?Current Outpatient Medications  ?Medication Sig Dispense Refill  ? ketoconazole (NIZORAL) 2 % shampoo Apply topically 2 (two) times a week. 120 mL 1  ? norgestimate-ethinyl estradiol (ORTHO-CYCLEN) 0.25-35 MG-MCG tablet Take 1 tablet by mouth daily. 84 tablet 4  ? nystatin cream (MYCOSTATIN) Apply 1 application. topically 2 (two) times daily. 30 g 4  ? ?No current facility-administered medications for this visit.  ? ? ? ?PHYSICAL EXAMINATION: ?ECOG PERFORMANCE STATUS: 0 - Asymptomatic ?Vitals:  ? 03/15/22 0941  ?BP: 103/81  ?Pulse: 94  ?Resp: 18  ?Temp: (!) 97.3 ?F (36.3 ?C)  ?SpO2: 98%  ? ?Filed Weights  ? 03/15/22 0941  ?Weight: 174 lb (78.9 kg)  ? ? ?Physical Exam ?Constitutional:   ?   General: She is not in acute distress. ?HENT:  ?   Head: Normocephalic and atraumatic.  ?Eyes:  ?   General: No scleral icterus. ?Cardiovascular:  ?   Rate and Rhythm: Normal rate and regular rhythm.  ?   Heart sounds: Normal heart sounds.  ?Pulmonary:  ?   Effort: Pulmonary effort is normal. No respiratory distress.  ?   Breath sounds: No wheezing.  ?Abdominal:  ?   General: Bowel sounds are normal. There is no distension.  ?   Palpations: Abdomen is soft.  ?Musculoskeletal:     ?   General: No deformity. Normal range of motion.  ?   Cervical back: Normal range of motion and neck supple.  ?Skin: ?   General: Skin is warm and dry.  ?   Findings: No erythema or rash.  ?Neurological:  ?   Mental Status: She is alert and oriented to person, place, and time. Mental status is at baseline.  ?   Cranial Nerves: No cranial nerve deficit.  ?   Coordination: Coordination normal.  ?Psychiatric:     ?   Mood and Affect: Mood normal.   ? ? ?LABORATORY DATA:  ?I have reviewed the data as listed ?Lab Results  ?Component Value Date  ? WBC 8.7 03/15/2022  ? HGB 14.2 03/15/2022  ? HCT 41.8 03/15/2022  ? MCV 91.9 03/15/2022  ? PLT 397 03/15/2022  ? ?Recent Labs  ?  04/27/21 ?1111  ?NA 137  ?K 4.7  ?CL 101  ?CO2 21  ?GLUCOSE 103*  ?BUN 8  ?CREATININE 0.75  ?CALCIUM 9.7  ?PROT 7.2  ?ALBUMIN 4.4  ?AST 17  ?ALT 23  ?ALKPHOS 81  ?BILITOT <0.2  ? ?Iron/TIBC/Ferritin/ %Sat ?   ?Component Value Date/Time  ? IRON 94 04/27/2021 1111  ?  TIBC 430 04/27/2021 1111  ? FERRITIN 30 04/27/2021 1111  ? IRONPCTSAT 22 04/27/2021 1111  ?  ? ? ?RADIOGRAPHIC STUDIES: ?I have personally reviewed the radiological images as listed and agreed with the findings in the report. ?No results found. ? ? ? ?ASSESSMENT & PLAN:  ?1. Neutrophilia   ?2. Vaping nicotine dependence, tobacco product   ? ?#Chronic leukocytosis, predominantly neutrophilia, ?Likely reactive.  Secondary to smoking. ?Rule out other etiologies ?Check CBC, smear, peripheral blood flow cytometry, protein electrophoresis, HIV, hepatitis. ? ?#Vaping, cessation discussed with patient. ? ? ?Orders Placed This Encounter  ?Procedures  ? Technologist smear review  ?  Standing Status:   Future  ?  Number of Occurrences:   1  ?  Standing Expiration Date:   03/16/2023  ? CBC with Differential/Platelet  ?  Standing Status:   Future  ?  Number of Occurrences:   1  ?  Standing Expiration Date:   03/16/2023  ? Hepatitis panel, acute  ?  Standing Status:   Future  ?  Number of Occurrences:   1  ?  Standing Expiration Date:   03/16/2023  ? HIV Antibody (routine testing w rflx)  ?  Standing Status:   Future  ?  Number of Occurrences:   1  ?  Standing Expiration Date:   03/16/2023  ? Flow cytometry panel-leukemia/lymphoma work-up  ?  Standing Status:   Future  ?  Number of Occurrences:   1  ?  Standing Expiration Date:   03/16/2023  ? Protein electrophoresis, serum  ?  Standing Status:   Future  ?  Number of Occurrences:   1  ?  Standing  Expiration Date:   03/16/2023  ?  ?All questions were answered. The patient knows to call the clinic with any problems questions or concerns. ? ?cc ?Marjie Skiff, NP  ? ? ?Return of visit: 3 to 4 weeks to r

## 2022-03-16 LAB — PROTEIN ELECTROPHORESIS, SERUM
A/G Ratio: 0.9 (ref 0.7–1.7)
Albumin ELP: 3.3 g/dL (ref 2.9–4.4)
Alpha-1-Globulin: 0.2 g/dL (ref 0.0–0.4)
Alpha-2-Globulin: 0.9 g/dL (ref 0.4–1.0)
Beta Globulin: 1.2 g/dL (ref 0.7–1.3)
Gamma Globulin: 1.1 g/dL (ref 0.4–1.8)
Globulin, Total: 3.5 g/dL (ref 2.2–3.9)
Total Protein ELP: 6.8 g/dL (ref 6.0–8.5)

## 2022-03-17 LAB — COMP PANEL: LEUKEMIA/LYMPHOMA

## 2022-04-12 ENCOUNTER — Telehealth: Payer: Self-pay

## 2022-04-12 ENCOUNTER — Inpatient Hospital Stay: Payer: Medicaid Other | Attending: Oncology | Admitting: Oncology

## 2022-04-12 NOTE — Telephone Encounter (Signed)
Pt no showed to visit to today. Attempted to reach pt, but unable to reach. LVM with call back number to call and r/s appt ?

## 2022-04-30 NOTE — Patient Instructions (Signed)

## 2022-05-02 ENCOUNTER — Ambulatory Visit (INDEPENDENT_AMBULATORY_CARE_PROVIDER_SITE_OTHER): Payer: Medicaid Other | Admitting: Nurse Practitioner

## 2022-05-02 ENCOUNTER — Encounter: Payer: Self-pay | Admitting: Nurse Practitioner

## 2022-05-02 VITALS — BP 94/61 | HR 84 | Temp 98.5°F | Ht 64.0 in | Wt 177.8 lb

## 2022-05-02 DIAGNOSIS — Z7289 Other problems related to lifestyle: Secondary | ICD-10-CM | POA: Diagnosis not present

## 2022-05-02 DIAGNOSIS — N898 Other specified noninflammatory disorders of vagina: Secondary | ICD-10-CM

## 2022-05-02 DIAGNOSIS — D729 Disorder of white blood cells, unspecified: Secondary | ICD-10-CM

## 2022-05-02 DIAGNOSIS — D72828 Other elevated white blood cell count: Secondary | ICD-10-CM

## 2022-05-02 DIAGNOSIS — Z0000 Encounter for general adult medical examination without abnormal findings: Secondary | ICD-10-CM | POA: Diagnosis not present

## 2022-05-02 DIAGNOSIS — E7800 Pure hypercholesterolemia, unspecified: Secondary | ICD-10-CM | POA: Diagnosis not present

## 2022-05-02 DIAGNOSIS — E559 Vitamin D deficiency, unspecified: Secondary | ICD-10-CM

## 2022-05-02 DIAGNOSIS — Z8759 Personal history of other complications of pregnancy, childbirth and the puerperium: Secondary | ICD-10-CM

## 2022-05-02 DIAGNOSIS — Z789 Other specified health status: Secondary | ICD-10-CM | POA: Insufficient documentation

## 2022-05-02 DIAGNOSIS — E538 Deficiency of other specified B group vitamins: Secondary | ICD-10-CM

## 2022-05-02 DIAGNOSIS — E6609 Other obesity due to excess calories: Secondary | ICD-10-CM

## 2022-05-02 DIAGNOSIS — Z8659 Personal history of other mental and behavioral disorders: Secondary | ICD-10-CM

## 2022-05-02 DIAGNOSIS — Z6830 Body mass index (BMI) 30.0-30.9, adult: Secondary | ICD-10-CM

## 2022-05-02 DIAGNOSIS — F53 Postpartum depression: Secondary | ICD-10-CM

## 2022-05-02 MED ORDER — FLUCONAZOLE 150 MG PO TABS: 150.0000 mg | ORAL_TABLET | Freq: Once | ORAL | 0 refills | Status: AC

## 2022-05-02 MED ORDER — NORGESTIMATE-ETH ESTRADIOL 0.25-35 MG-MCG PO TABS: 1.0000 | ORAL_TABLET | Freq: Every day | ORAL | 4 refills | Status: DC

## 2022-05-02 NOTE — Assessment & Plan Note (Signed)
Stable at this time without medication.  Monitor for recurrence.

## 2022-05-02 NOTE — Assessment & Plan Note (Signed)
Noted on labs, recheck CBC today and recommend she schedule follow-up with hematology.

## 2022-05-02 NOTE — Assessment & Plan Note (Signed)
Noted on labs in 2022 with LDL 110.  Recheck fasting labs today and continue focus on diet changes and regular activity.

## 2022-05-02 NOTE — Assessment & Plan Note (Signed)
Ongoing, will send in refills and check CMP + urine pregnancy testing today.

## 2022-05-02 NOTE — Assessment & Plan Note (Signed)
Ongoing.  Noted on recent labs.  She is currently taking supplement on occasion, recommend she take consistently.  Recheck today.

## 2022-05-02 NOTE — Progress Notes (Signed)
BP 94/61   Pulse 84   Temp 98.5 F (36.9 C) (Oral)   Ht 5\' 4"  (1.626 m)   Wt 177 lb 12.8 oz (80.6 kg)   SpO2 99%   BMI 30.52 kg/m    Subjective:    Patient ID: Danielle Gillespie, female    DOB: [birth year], 31 y.o.   MRN: [id number]  HPI: Danielle Gillespie is a 31 y.o. female presenting on 05/02/2022 for comprehensive medical examination. Current medical complaints include: vaginal yeast infection  She currently lives with: husband and children Menopausal Symptoms: no  Has known low Vitamin D and B12 -- takes supplements when remembers.  Seen by hematology for neutrophilia on 03/15/22, did not attend follow-up.  Does continue to vape.  VAGINAL DISCHARGE Recurrent yeast infections since pregnancy. Duration: days Discharge description: white  Pruritus: yes Dysuria: no Malodorous: no Urinary frequency: no Fevers: no Abdominal pain: no  Sexual activity: monogamous History of sexually transmitted diseases: no Recent antibiotic use: no Context: stable Treatments attempted: none   History of postpartum depression -- no current medications.  Has been doing well without these.      05/02/2022    2:17 PM 06/08/2021   11:25 AM 06/03/2021   10:00 AM 05/30/2021   11:16 AM 04/27/2021   10:16 AM  Depression screen PHQ 2/9  Decreased Interest 0 1 0 0 2  Down, Depressed, Hopeless 0 2 0 1 2  PHQ - 2 Score 0 3 0 1 4  Altered sleeping 0 0  2 3  Tired, decreased energy 0 2  2 3   Change in appetite 0 3  2 3   Feeling bad or failure about yourself  0 1  1 3   Trouble concentrating 0 0  0 2  Moving slowly or fidgety/restless 0 0  0 2  Suicidal thoughts 0 0  0 0  PHQ-9 Score 0 9  8 20   Difficult doing work/chores Not difficult at all Somewhat difficult  Very difficult Very difficult      06/03/2021   10:00 AM 03/15/2022    9:36 AM 03/15/2022    9:37 AM 05/02/2022    2:17 PM 05/02/2022    2:28 PM  [place] in the past year? 0   0 0  Was there an injury with Fall? 0   0 0  Fall Risk  Category Calculator 0   0 0  Fall Risk Category Low   Low Low  Patient Fall Risk Level  Low fall risk Low fall risk Low fall risk Low fall risk  Patient at Risk for Falls Due to No Fall Risks   No Fall Risks No Fall Risks  Fall risk Follow up Falls evaluation completed   Falls evaluation completed     Functional Status Survey: Is the patient deaf or have difficulty hearing?: No Does the patient have difficulty seeing, even when wearing glasses/contacts?: No Does the patient have difficulty concentrating, remembering, or making decisions?: No Does the patient have difficulty walking or climbing stairs?: No Does the patient have difficulty dressing or bathing?: No Does the patient have difficulty doing errands alone such as visiting a doctor's office or shopping?: No    Past Medical History:  Past Medical History:  Diagnosis Date   Anxiety    Depression     Surgical History:  Past Surgical History:  Procedure Laterality Date   NO PAST SURGERIES      Medications:  Current Outpatient Medications on File Prior to Visit  Medication Sig   ketoconazole (NIZORAL) 2 % shampoo Apply topically 2 (two) times a week.   nystatin cream (MYCOSTATIN) Apply 1 application. topically 2 (two) times daily.   No current facility-administered medications on file prior to visit.    Allergies:  No Known Allergies  Social History:  Social History   Socioeconomic History   Marital status: Significant Other    Spouse name: Not on file   Number of children: Not on file   Years of education: Not on file   Highest education level: Not on file  Occupational History   Not on file  Tobacco Use   Smoking status: Every Day    Types: E-cigarettes   Smokeless tobacco: Never   Tobacco comments:    09/16/20 pt states she smoked half a cigarrete yesterday.    Stopped cigarettes in 2022  Vaping Use   Vaping Use: Every day   Substances: Nicotine, Flavoring  Substance and Sexual Activity   Alcohol  use: No   Drug use: Not Currently    Types: Marijuana    Comment: one time smoke marijuana end March 2023   Sexual activity: Yes    Partners: Male    Birth control/protection: Pill  Other Topics Concern   Not on file  Social History Narrative   Not on file   Social Determinants of Health   Financial Resource Strain: Not on file  Food Insecurity: Not on file  Transportation Needs: Not on file  Physical Activity: Not on file  Stress: Not on file  Social Connections: Not on file  Intimate Partner Violence: Not on file   Social History   Tobacco Use  Smoking Status Every Day   Types: E-cigarettes  Smokeless Tobacco Never  Tobacco Comments   09/16/20 pt states she smoked half a cigarrete yesterday.   Stopped cigarettes in 2022   Social History   Substance and Sexual Activity  Alcohol Use No    Family History:  Family History  Problem Relation Age of Onset   Depression Mother    Diabetes Maternal Grandmother    Bipolar disorder Maternal Grandmother    Heart disease Maternal Grandmother    Depression Maternal Grandmother    Healthy Father    Dementia Paternal Grandmother    Depression Sister    Depression Brother     Past medical history, surgical history, medications, allergies, family history and social history reviewed with patient today and changes made to appropriate areas of the chart.   ROS All other ROS negative except what is listed above and in the HPI.      Objective:    BP 94/61   Pulse 84   Temp 98.5 F (36.9 C) (Oral)   Ht 5\' 4"  (1.626 m)   Wt 177 lb 12.8 oz (80.6 kg)   SpO2 99%   BMI 30.52 kg/m   Wt Readings from Last 3 Encounters:  05/02/22 177 lb 12.8 oz (80.6 kg)  03/15/22 174 lb (78.9 kg)  02/28/22 174 lb 3.2 oz (79 kg)    Physical Exam Vitals and nursing note reviewed. Exam conducted with a chaperone present.  Constitutional:      General: She is awake. She is not in acute distress.    Appearance: She is well-developed and  well-groomed. She is obese. She is not ill-appearing or toxic-appearing.  HENT:     Head: Normocephalic and atraumatic.     Right Ear: Hearing, tympanic membrane, ear canal and external ear normal. No drainage.  Left Ear: Hearing, tympanic membrane, ear canal and external ear normal. No drainage.     Nose: Nose normal.     Right Sinus: No maxillary sinus tenderness or frontal sinus tenderness.     Left Sinus: No maxillary sinus tenderness or frontal sinus tenderness.     Mouth/Throat:     Mouth: Mucous membranes are moist.     Pharynx: Oropharynx is clear. Uvula midline. No pharyngeal swelling, oropharyngeal exudate or posterior oropharyngeal erythema.  Eyes:     General: Lids are normal.        Right eye: No discharge.        Left eye: No discharge.     Extraocular Movements: Extraocular movements intact.     Conjunctiva/sclera: Conjunctivae normal.     Pupils: Pupils are equal, round, and reactive to light.     Visual Fields: Right eye visual fields normal and left eye visual fields normal.  Neck:     Thyroid: No thyromegaly.     Vascular: No carotid bruit.     Trachea: Trachea normal.  Cardiovascular:     Rate and Rhythm: Normal rate and regular rhythm.     Heart sounds: Normal heart sounds. No murmur heard.   No gallop.  Pulmonary:     Effort: Pulmonary effort is normal. No accessory muscle usage or respiratory distress.     Breath sounds: Normal breath sounds.  Abdominal:     General: Bowel sounds are normal.     Palpations: Abdomen is soft. There is no hepatomegaly or splenomegaly.     Tenderness: There is no abdominal tenderness.  Musculoskeletal:        General: Normal range of motion.     Cervical back: Normal range of motion and neck supple.     Right lower leg: No edema.     Left lower leg: No edema.  Lymphadenopathy:     Head:     Right side of head: No submental, submandibular, tonsillar, preauricular or posterior auricular adenopathy.     Left side of head:  No submental, submandibular, tonsillar, preauricular or posterior auricular adenopathy.     Cervical: No cervical adenopathy.  Skin:    General: Skin is warm and dry.     Capillary Refill: Capillary refill takes less than 2 seconds.     Findings: No rash.  Neurological:     Mental Status: She is alert and oriented to person, place, and time.     Gait: Gait is intact.     Deep Tendon Reflexes: Reflexes are normal and symmetric.     Reflex Scores:      Brachioradialis reflexes are 2+ on the right side and 2+ on the left side.      Patellar reflexes are 2+ on the right side and 2+ on the left side. Psychiatric:        Attention and Perception: Attention normal.        Mood and Affect: Mood normal.        Speech: Speech normal.        Behavior: Behavior normal. Behavior is cooperative.        Thought Content: Thought content normal.        Judgment: Judgment normal.    Results for orders placed or performed in visit on 03/15/22  Protein electrophoresis, serum  Result Value Ref Range   Total Protein ELP 6.8 6.0 - 8.5 g/dL   Albumin ELP 3.3 2.9 - 4.4 g/dL   Alpha-1-Globulin 0.2 0.0 -  0.4 g/dL   Alpha-2-Globulin 0.9 0.4 - 1.0 g/dL   Beta Globulin 1.2 0.7 - 1.3 g/dL   Gamma Globulin 1.1 0.4 - 1.8 g/dL   M-Spike, % Not Observed Not Observed g/dL   SPE Interp. Comment    Comment Comment    Globulin, Total 3.5 2.2 - 3.9 g/dL   A/G Ratio 0.9 0.7 - 1.7  Flow cytometry panel-leukemia/lymphoma work-up  Result Value Ref Range   PATH INTERP XXX-IMP Comment    CLINICAL INFO Comment    Specimen Type Comment    ASSESSMENT OF LEUKOCYTES Comment    % Viable Cells Comment    ANALYSIS AND GATING STRATEGY Comment    IMMUNOPHENOTYPING STUDY Comment    PATHOLOGIST NAME Comment    COMMENT: Comment   HIV Antibody (routine testing w rflx)  Result Value Ref Range   HIV Screen 4th Generation wRfx Non Reactive Non Reactive  Hepatitis panel, acute  Result Value Ref Range   Hepatitis B Surface Ag  NON REACTIVE NON REACTIVE   HCV Ab NON REACTIVE NON REACTIVE   Hep A IgM NON REACTIVE NON REACTIVE   Hep B C IgM NON REACTIVE NON REACTIVE  CBC with Differential/Platelet  Result Value Ref Range   WBC 8.7 4.0 - 10.5 K/uL   RBC 4.55 3.87 - 5.11 MIL/uL   Hemoglobin 14.2 12.0 - 15.0 g/dL   HCT 41.8 36.0 - 46.0 %   MCV 91.9 80.0 - 100.0 fL   MCH 31.2 26.0 - 34.0 pg   MCHC 34.0 30.0 - 36.0 g/dL   RDW 12.2 11.5 - 15.5 %   Platelets 397 150 - 400 K/uL   nRBC 0.0 0.0 - 0.2 %   Neutrophils Relative % 70 %   Neutro Abs 6.1 1.7 - 7.7 K/uL   Lymphocytes Relative 23 %   Lymphs Abs 2.0 0.7 - 4.0 K/uL   Monocytes Relative 5 %   Monocytes Absolute 0.4 0.1 - 1.0 K/uL   Eosinophils Relative 1 %   Eosinophils Absolute 0.1 0.0 - 0.5 K/uL   Basophils Relative 1 %   Basophils Absolute 0.1 0.0 - 0.1 K/uL   Immature Granulocytes 0 %   Abs Immature Granulocytes 0.02 0.00 - 0.07 K/uL  Technologist smear review  Result Value Ref Range   WBC MORPHOLOGY Normal RBC, WBC, and platelet    RBC MORPHOLOGY Normal RBC, WBC, and platelet    Tech Review Normal RBC, WBC, and platelet       Assessment & Plan:   Problem List Items Addressed This Visit       Other   Elevated low density lipoprotein (LDL) cholesterol level    Noted on labs in 2022 with LDL 110.  Recheck fasting labs today and continue focus on diet changes and regular activity.       Relevant Orders   Lipid Panel w/o Chol/HDL Ratio   History of postpartum depression - Primary    Stable at this time without medication.  Monitor for recurrence.       Relevant Orders   TSH   Neutrophilia    Noted on labs, recheck CBC today and recommend she schedule follow-up with hematology.         Relevant Orders   CBC with Differential/Platelet   Comprehensive metabolic panel   Obesity    BMI 30.52.  Recommended eating smaller high protein, low fat meals more frequently and exercising 30 mins a day 5 times a week with a goal of 10-15lb  weight  loss in the next 3 months. Patient voiced their understanding and motivation to adhere to these recommendations.        Uses birth control    Ongoing, will send in refills and check CMP + urine pregnancy testing today.       Relevant Orders   Pregnancy, urine   Vapes non-nicotine containing substance    Ongoing, previously smoked cigarettes, recommend complete cessation of vaping.       Vitamin B12 deficiency    Ongoing.  Noted on past labs with reported history of anemia -- she is taking supplement on occasion at this time.  Recommend she take consistently and will recheck level today.       Relevant Orders   CBC with Differential/Platelet   Vitamin B12   Vitamin D deficiency    Ongoing.  Noted on recent labs.  She is currently taking supplement on occasion, recommend she take consistently.  Recheck today.       Relevant Orders   VITAMIN D 25 Hydroxy (Vit-D Deficiency, Fractures)   Other Visit Diagnoses     Vaginal discharge       Unable to perform wet prep or UA in clinic today.  Will send in Diflucan for yeast and recommend she return if ongoing symptoms.   Encounter for annual physical exam       Annual physical with labs and health maintenance reviewed with patient.   Relevant Orders   TSH        Follow up plan: Return in about 1 year (around 05/03/2023) for Annual physical.   LABORATORY TESTING:  - Pap smear: up to date  IMMUNIZATIONS:   - Tdap: Tetanus vaccination status reviewed: last tetanus booster within 10 years. - Influenza: Refused - Pneumovax: Not applicable - Prevnar: Not applicable - COVID: Refused - HPV: Not applicable - Shingrix vaccine: Not applicable  SCREENING: -Mammogram: Not applicable  - Colonoscopy: Not applicable  - Bone Density: Not applicable  -Hearing Test: Not applicable  -Spirometry: Not applicable   PATIENT COUNSELING:   Advised to take 1 mg of folate supplement per day if capable of pregnancy.   Sexuality:  Discussed sexually transmitted diseases, partner selection, use of condoms, avoidance of unintended pregnancy  and contraceptive alternatives.   Advised to avoid cigarette smoking.  I discussed with the patient that most people either abstain from alcohol or drink within safe limits (<=14/week and <=4 drinks/occasion for males, <=7/weeks and <= 3 drinks/occasion for females) and that the risk for alcohol disorders and other health effects rises proportionally with the number of drinks per week and how often a drinker exceeds daily limits.  Discussed cessation/primary prevention of drug use and availability of treatment for abuse.   Diet: Encouraged to adjust caloric intake to maintain  or achieve ideal body weight, to reduce intake of dietary saturated fat and total fat, to limit sodium intake by avoiding high sodium foods and not adding table salt, and to maintain adequate dietary potassium and calcium preferably from fresh fruits, vegetables, and low-fat dairy products.    Stressed the importance of regular exercise  Injury prevention: Discussed safety belts, safety helmets, smoke detector, smoking near bedding or upholstery.   Dental health: Discussed importance of regular tooth brushing, flossing, and dental visits.    NEXT PREVENTATIVE PHYSICAL DUE IN 1 YEAR. Return in about 1 year (around 05/03/2023) for Annual physical.

## 2022-05-02 NOTE — Assessment & Plan Note (Signed)
Ongoing, previously smoked cigarettes, recommend complete cessation of vaping.

## 2022-05-02 NOTE — Assessment & Plan Note (Signed)
BMI 30.52.  Recommended eating smaller high protein, low fat meals more frequently and exercising 30 mins a day 5 times a week with a goal of 10-15lb weight loss in the next 3 months. Patient voiced their understanding and motivation to adhere to these recommendations.  

## 2022-05-02 NOTE — Assessment & Plan Note (Signed)
Ongoing.  Noted on past labs with reported history of anemia -- she is taking supplement on occasion at this time.  Recommend she take consistently and will recheck level today.

## 2022-05-03 ENCOUNTER — Encounter: Payer: Self-pay | Admitting: Nurse Practitioner

## 2022-05-03 LAB — VITAMIN B12: Vitamin B-12: 278 pg/mL (ref 232–1245)

## 2022-05-03 LAB — CBC WITH DIFFERENTIAL/PLATELET
Basophils Absolute: 0.1 10*3/uL (ref 0.0–0.2)
Basos: 1 %
EOS (ABSOLUTE): 0.1 10*3/uL (ref 0.0–0.4)
Eos: 1 %
Hematocrit: 40.7 % (ref 34.0–46.6)
Hemoglobin: 13.9 g/dL (ref 11.1–15.9)
Immature Grans (Abs): 0 10*3/uL (ref 0.0–0.1)
Immature Granulocytes: 0 %
Lymphocytes Absolute: 2.8 10*3/uL (ref 0.7–3.1)
Lymphs: 26 %
MCH: 31.4 pg (ref 26.6–33.0)
MCHC: 34.2 g/dL (ref 31.5–35.7)
MCV: 92 fL (ref 79–97)
Monocytes Absolute: 0.6 10*3/uL (ref 0.1–0.9)
Monocytes: 5 %
Neutrophils Absolute: 7.4 10*3/uL — ABNORMAL HIGH (ref 1.4–7.0)
Neutrophils: 67 %
Platelets: 398 10*3/uL (ref 150–450)
RBC: 4.42 x10E6/uL (ref 3.77–5.28)
RDW: 12.4 % (ref 11.7–15.4)
WBC: 11 10*3/uL — ABNORMAL HIGH (ref 3.4–10.8)

## 2022-05-03 LAB — COMPREHENSIVE METABOLIC PANEL
ALT: 14 IU/L (ref 0–32)
AST: 10 IU/L (ref 0–40)
Albumin/Globulin Ratio: 1.4 (ref 1.2–2.2)
Albumin: 4.2 g/dL (ref 3.8–4.8)
Alkaline Phosphatase: 76 IU/L (ref 44–121)
BUN/Creatinine Ratio: 15 (ref 9–23)
BUN: 12 mg/dL (ref 6–20)
Bilirubin Total: <0.2 mg/dL (ref 0.0–1.2)
CO2: 20 mmol/L (ref 20–29)
Calcium: 9.5 mg/dL (ref 8.7–10.2)
Chloride: 103 mmol/L (ref 96–106)
Creatinine, Ser: 0.82 mg/dL (ref 0.57–1.00)
Globulin, Total: 3 g/dL (ref 1.5–4.5)
Glucose: 97 mg/dL (ref 70–99)
Potassium: 4.5 mmol/L (ref 3.5–5.2)
Sodium: 138 mmol/L (ref 134–144)
Total Protein: 7.2 g/dL (ref 6.0–8.5)
eGFR: 98 mL/min/{1.73_m2} (ref >59)

## 2022-05-03 LAB — LIPID PANEL W/O CHOL/HDL RATIO
Cholesterol, Total: 183 mg/dL (ref 100–199)
HDL: 45 mg/dL (ref >39)
LDL Chol Calc (NIH): 117 mg/dL — ABNORMAL HIGH (ref 0–99)
Triglycerides: 117 mg/dL (ref 0–149)
VLDL Cholesterol Cal: 21 mg/dL (ref 5–40)

## 2022-05-03 LAB — PREGNANCY, URINE: Preg Test, Ur: NEGATIVE

## 2022-05-03 LAB — VITAMIN D 25 HYDROXY (VIT D DEFICIENCY, FRACTURES): Vit D, 25-Hydroxy: 44 ng/mL (ref 30.0–100.0)

## 2022-05-03 LAB — TSH: TSH: 1.46 u[IU]/mL (ref 0.450–4.500)

## 2022-05-03 NOTE — Progress Notes (Signed)
Contacted via MyChart   Good morning Danielle Gillespie your labs have returned: - CBC continues to show mild elevation in white blood cell count and neutrophils.  Did you ever follow-up with hematology, if not I recommend you call and schedule follow-up on this. - Kidney function, creatinine and eGFR, remains normal, as is liver function, AST and ALT.   - Thyroid level and Vitamin D is stable. B12 level remains on lower side, please ensure to take your supplement daily + Vitamin D. - Your LDL is above normal. The LDL is the bad cholesterol. Over time and in combination with inflammation and other factors, this contributes to plaque which in turn may lead to stroke and/or heart attack down the road. Sometimes high LDL is primarily genetic, and people might be eating all the right foods but still have high numbers. Other times, there is room for improvement in one's diet and eating healthier can bring this number down and potentially reduce one's risk of heart attack and/or stroke.   To reduce your LDL, Remember - more fruits and vegetables, more fish, and limit red meat and dairy products. More soy, nuts, beans, barley, lentils, oats and plant sterol ester enriched margarine instead of butter. I also encourage eliminating sugar and processed food. Remember, shop on the outside of the grocery store and visit your [employer]. If you would like to talk with me about dietary changes for your cholesterol, please let me know. We should recheck your cholesterol in 12 months.  Any questions? Keep being amazing!!  Thank you for allowing me to participate in your care.  I appreciate you. Kindest regards, Danielle Gillespie

## 2022-05-16 ENCOUNTER — Ambulatory Visit: Payer: Medicaid Other | Attending: Nurse Practitioner

## 2022-05-16 ENCOUNTER — Other Ambulatory Visit: Payer: Self-pay

## 2022-05-16 DIAGNOSIS — R208 Other disturbances of skin sensation: Secondary | ICD-10-CM | POA: Diagnosis present

## 2022-05-16 DIAGNOSIS — R102 Pelvic and perineal pain: Secondary | ICD-10-CM | POA: Insufficient documentation

## 2022-05-16 DIAGNOSIS — N941 Unspecified dyspareunia: Secondary | ICD-10-CM | POA: Insufficient documentation

## 2022-05-16 DIAGNOSIS — R278 Other lack of coordination: Secondary | ICD-10-CM | POA: Insufficient documentation

## 2022-05-16 DIAGNOSIS — M533 Sacrococcygeal disorders, not elsewhere classified: Secondary | ICD-10-CM | POA: Insufficient documentation

## 2022-05-16 DIAGNOSIS — M6281 Muscle weakness (generalized): Secondary | ICD-10-CM | POA: Insufficient documentation

## 2022-05-16 NOTE — Therapy (Signed)
[facility] MAIN [facility] SERVICES [address] [place], [place], [address] Phone: [phone]   Fax:  [phone]  Physical Therapy Evaluation  Patient Details  Name: Danielle Gillespie MRN: [id number] Date of Birth: 1991-12-03 Referring Provider (PT): Aura Dials, NP   Encounter Date: 05/16/2022   PT End of Session - 05/16/22 1052     Visit Number 1    Number of Visits 9    Date for PT Re-Evaluation 07/15/22    Authorization Type Medicaid-waiting on auth    Progress Note Due on Visit 10    PT Start Time 1007   pt late   PT Stop Time 1045    PT Time Calculation (min) 38 min    Activity Tolerance Patient tolerated treatment well    Behavior During Therapy [facility] for tasks assessed/performed             Past Medical History:  Diagnosis Date   Anxiety    Depression     Past Surgical History:  Procedure Laterality Date   NO PAST SURGERIES      There were no vitals filed for this visit.    Subjective Assessment - 05/16/22 1015     Subjective Pt reported vaginal pain began around Oct/Nov 2022 (8 months postpartum) intercourse began to be painful at introitus. Pt has hx of clitoral tear and has pain with wiping and intercourse. Describes pain as burning and feels like something trying to fit through a tiny hole. Clitoral area has atrophied since tear. She also has soreness after intercourse. Pt reported painful tampon insertion. Pt plans to see OBGYN soon, once she is able to call (poor signal at house). Urinary: pt denied leakage or UTIs but has experienced many yeast infections postpartum. Pt gets up 1-2/night to void. Bowel movements: pt with hx of hemorroids, pt takes fiber and that has decr. stool firmness and pt has two BMs a day. Core: pt denied abdominal injury but did state she has back (mid and low) pain. Pt stated she was going to the gym five days a week and walking twice a day but had a pinched nerve in her back two months ago which  caused her to stop.    Pertinent History hx of trauma to clitoral area and vaginal wall during L and D. yeast infection, hemorrhoids, vit B12 deficiency, hx of postpartum depression (no longer on meds), vit D deficiency, HDL    Patient Stated Goals Less painful vulva to decr. pain during intercourse and with tampon insertion.    Currently in Pain? No/denies                [facility] PT Assessment - 05/16/22 1027       Assessment   Medical Diagnosis Vaginal pain    Referring Provider (PT) Aura Dials, NP    Onset Date/Surgical Date --   01/09/22: referral date   Hand Dominance Right    Prior Therapy none      Precautions   Precautions None      Restrictions   Weight Bearing Restrictions No      Balance Screen   Has the patient fallen in the past 6 months No      Home Environment   Living Environment Private residence    Living Arrangements Children;Spouse/significant other      Prior Function   Level of Independence Independent    Vocation Unemployed   stay at home [facility]   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     Pelvic Floor Special Questions - 05/16/22 1031     Are you Pregnant or attempting pregnancy? No    Prior Pregnancies Yes    Number of Pregnancies 1    Number of C-Sections 0    Number of Vaginal Deliveries 1    Any difficulty with labor and deliveries Yes   tearing at clitoral area   Currently Sexually Active Yes    Is this Painful Yes    History of sexually transmitted disease Yes   clamydia   Marinoff Scale pain interrupts completion    Urinary Leakage No    Urinary urgency No    Urinary frequency Twice/four hours    Fecal incontinence No    Fluid intake Pt drinks 2-3 bottles of water a day.  Diet green teas through the day (1-2 bottles). Occasionally cold brew coffee.    Caffeine beverages Tea and coffee    Falling out feeling (prolapse) No                 Pelvic Floor Physical Therapy Evaluation and Assessment  Screenings: Red Flags: no Have you had any night sweats? No Unexplained weight loss? no Saddle anesthesia? no Unexplained changes in bowel or bladder changes? no   OBJECTIVE  Posture/Observations:  Sitting: LEs crossed at ankles, round shoulders, FHP Standing: incr. Lx spine lordosis and ant. Pelvic tilt     Range of Motion/Flexibilty:  Spine: WNL with pt reporting incr. Back/hip pain with flexion and ext (ext>flex) Hips: not tested 2/2 time contraints   Strength/MMT:  LE MMT  B knee ext: 4/5 B knee flex: 4/5 B ankle DF: 5/5 Gross hip abd/add in seated: 4-/5 LE MMT Left Right  Hip flex:  (L2) /5 /5  Hip ext: /5 /5  Hip abd: /5 /5  Hip add: /5 /5  Hip IR /5 /5  Hip ER /5 /5            PT Education - 05/16/22 1051     Education Details PT educated pt on POC, frequency and duration. PT educated pt on Medicaid auth process. PT educated pt on proper toileting posture to fully empty bladder and reduce straining. PT educated pt on not crossing LEs to reduce LE/PFM tension.    Person(s) Educated Patient    Methods Explanation;Demonstration;Verbal cues    Comprehension Verbalized understanding;Returned demonstration;Need further instruction              PT Short Term Goals - 05/16/22 1235       PT SHORT TERM GOAL #1   Title Pt will be IND in HEP to improve pain, strength, balance and flexibility. (ALL STGS TARGET DATE: 3 VISITS)    Baseline no HEP    Status New      PT SHORT TERM GOAL #2   Title Pt will be able to verbalize and demo proper toileting posture, seated posture and scar mobilization to decr. pain and tension in pelvic floor musculature and fully empty bladder and decr. strain during bowel movements.    Baseline  Unable to verbalize and demo proper posture.    Status New  PT SHORT TERM GOAL #3   Title Have pt complete FOTO and write goal as indicated    Baseline not performed 2/2 time constraints.    Status New               PT Long Term Goals - 05/16/22 1239       PT LONG TERM GOAL #1   Title Pt will demonstrate improved pelvic alignment, hip strength and posture in order to decr. back pain while caring for her child. (TARGE DATE FOR ALL LTGS: 8 VISITS)    Baseline incr. lumbar spine lordosis, incr. ant. pelvic tilt, 4-/5 gross seated hip abd/add, forward head posture, rounded shoulders    Status New      PT LONG TERM GOAL #2   Title Pt will demonstrat proper coordination (relaxation and contraction) of pelvic floor musculature with breathing to decr. vulvar, vaginal and PFM pain while inserting tampon and during intercourse.    Baseline Unable to coordinate with breath and pain with tampon insertion and intercourse-has not had OBGYN recently    Status New      PT LONG TERM GOAL #3   Title Perform internal muscle assessment and write goals as indicated.    Baseline not performed 2/2 time constraints    Status New      PT LONG TERM GOAL #4   Title Pt will report ability to maintain proper posture and engage TrA musculature at gym for 30+ minutes without incr. Back pain to maintain gains made in PT.    Baseline does not go to gym 2/2 back pain    Status New                    Plan - 05/16/22 1053     Clinical Impression Statement Pt is a pleasant 31y/o female presenting to OP pelvic health PT for vaginal pain and PMF dysfunction. Pt's PMH is significant for the following: hx of trauma to clitoral area and vaginal wall during delivery of child. hx of yeast infection, hemorrhoids, vit B12 deficiency, hx of postpartum depression (no longer on meds), vit D deficiency, HDL. The following impairments were noted upon exam: impaired posture (FHP, decr. B SLS balance, incr. lx  spine lordosis, incr. ant. pelvic tilt), decr. flexibility, decr. strength (hips and LEs), gait deviations and pelvic/back pain. PT will formally assess spine/hip mobility next session as session limited by time constraints. Pt would benefit from skilled PT to improve deficits listed above including reduction of pain with tampon insertion and intercourse.    Personal Factors and Comorbidities Comorbidity 3+;Time since onset of injury/illness/exacerbation    Comorbidities hx of trauma to clitoral area and vaginal wall during L and D. yeast infection, hemorrhoids, vit B12 deficiency, hx of postpartum depression (no longer on meds), vit D deficiency, HDL    Examination-Activity Limitations Locomotion Level    Examination-Participation Restrictions Interpersonal Relationship    Stability/Clinical Decision Making Stable/Uncomplicated    Clinical Decision Making Low    Rehab Potential Good    PT Frequency 1x / week    PT Duration 8 weeks    PT Treatment/Interventions ADLs/Self Care Home Management;Biofeedback;Moist Heat;Therapeutic activities;Functional mobility training;Gait training;Therapeutic exercise;Balance training;Neuromuscular re-education;Patient/family education;Manual techniques;Scar mobilization;Dry needling;Spinal Manipulations;Joint Manipulations    PT Next Visit Plan Spine/sacrum/PFM assessment, Initiate HEP, FOTO.    Consulted and Agree with Plan of Care Patient             Patient will benefit from skilled therapeutic intervention in order  to improve the following deficits and impairments:  Abnormal gait, Decreased balance, Hypomobility, Pain, Postural dysfunction, Impaired flexibility, Decreased strength, Decreased coordination, Decreased range of motion, Impaired sensation  Visit Diagnosis: Muscle weakness (generalized) - Plan: PT plan of care cert/re-cert  Other lack of coordination - Plan: PT plan of care cert/re-cert  Dyspareunia in female - Plan: PT plan of care  cert/re-cert  Other disturbances of skin sensation - Plan: PT plan of care cert/re-cert  Sacrococcygeal disorders, not elsewhere classified - Plan: PT plan of care cert/re-cert     Problem List Patient Active Problem List   Diagnosis Date Noted   Uses birth control 05/02/2022   Hemorrhoid 06/28/2021   Elevated low density lipoprotein (LDL) cholesterol level 04/28/2021   Vitamin D deficiency 04/28/2021   Neutrophilia 04/28/2021   History of postpartum depression 04/27/2021   Obesity 04/27/2021   Vapes non-nicotine containing substance 04/27/2021   Vitamin B12 deficiency 04/23/2021   Marijuana use 05/13/2020    Aralynn Brake L, PT 05/16/2022, 12:47 PM  [facility] MAIN [facility] SERVICES [address] [place], [place], [address] Phone: [phone]   Fax:  [phone]  Name: Weyman RodneyBrittany Patch MRN: [id number] Date of Birth: 01/30/1991  Zerita BoersJennifer Angelize Ryce, PT,DPT 05/16/22 12:47 PM Phone: [phone] Fax: [phone]

## 2022-05-24 ENCOUNTER — Other Ambulatory Visit: Payer: Self-pay

## 2022-05-24 ENCOUNTER — Ambulatory Visit: Payer: Medicaid Other

## 2022-05-24 DIAGNOSIS — R278 Other lack of coordination: Secondary | ICD-10-CM

## 2022-05-24 DIAGNOSIS — R208 Other disturbances of skin sensation: Secondary | ICD-10-CM

## 2022-05-24 DIAGNOSIS — M6281 Muscle weakness (generalized): Secondary | ICD-10-CM

## 2022-05-24 DIAGNOSIS — N941 Unspecified dyspareunia: Secondary | ICD-10-CM

## 2022-05-24 DIAGNOSIS — M533 Sacrococcygeal disorders, not elsewhere classified: Secondary | ICD-10-CM

## 2022-05-24 NOTE — Therapy (Addendum)
OUTPATIENT PHYSICAL THERAPY TREATMENT NOTE   Patient Name: Danielle Gillespie MRN: [id number] DOB:1991-03-01, 31 y.o., female Today's Date: 05/24/2022  PCP: Aura Dials, NP  REFERRING PROVIDER: same as above   PT End of Session - 05/24/22 1408     Visit Number 2    Number of Visits 9    Date for PT Re-Evaluation 07/15/22    Authorization Type Medicaid    Authorization Time Period [facility] auth# [id number] 6/21-8/20 10 PT visits    Progress Note Due on Visit 10    PT Start Time 1405    PT Stop Time 1451    PT Time Calculation (min) 46 min    Activity Tolerance Patient tolerated treatment well    Behavior During Therapy [facility] for tasks assessed/performed             Past Medical History:  Diagnosis Date   Anxiety    Depression    Past Surgical History:  Procedure Laterality Date   NO PAST SURGERIES     Patient Active Problem List   Diagnosis Date Noted   Uses birth control 05/02/2022   Hemorrhoid 06/28/2021   Elevated low density lipoprotein (LDL) cholesterol level 04/28/2021   Vitamin D deficiency 04/28/2021   Neutrophilia 04/28/2021   History of postpartum depression 04/27/2021   Obesity 04/27/2021   Vapes non-nicotine containing substance 04/27/2021   Vitamin B12 deficiency 04/23/2021   Marijuana use 05/13/2020    REFERRING DIAG: vaginal pain  THERAPY DIAG:  Muscle weakness (generalized)  Other lack of coordination  Dyspareunia in female  Other disturbances of skin sensation  Sacrococcygeal disorders, not elsewhere classified  Rationale for Evaluation and Treatment Rehabilitation  PERTINENT HISTORY: hx of trauma to clitoral area and vaginal wall during L and D. yeast infection, hemorrhoids, vit B12 deficiency, hx of postpartum depression (no longer on meds), vit D deficiency, HDL   PRECAUTIONS: none  SUBJECTIVE: Pt reported she noticed a few things she wasn't aware of since last visit. Pt does feel pressure while voiding right at the at of  emptying bladder with mild pain. Pt has been experiencing pain in lower abdominal area (intermittent), not sure what makes it better or worse but did have the same type of pain during intercourse-same area as where she has period cramps.   PAIN:  Are you having pain? No     TODAY'S TREATMENT:  Manual therapy: PT assessed spine: WNL Sacrum: WNL Coccyx: hypomobile. Decr. B hip IR with deep hip pain. No diastasis noted but pain with palpation. TTP: at B piriformis and B hamstring MMT B hip abd/add: 3+/5.  NMR: Access Code: [email or web address] URL: [email or web address] Date: 05/24/2022 Prepared by: Zerita Boers  Exercises - Supine Diaphragmatic Breathing  - 1 x daily - 7 x weekly - 2 sets - 5 reps - Supine Piriformis Stretch  - 2-3 x daily - 7 x weekly - 1 sets - 3 reps - 30-45 hold - Supine Figure 4 Piriformis Stretch  - 2-3 x daily - 7 x weekly - 1 sets - 3 reps - 30 hold - Clamshell  - 1 x daily - 3 x weekly - 3 sets - 10 reps - 1-2 hold Performed with S for safety and cues/demo for proper technique.  Cues and demo for technique. S for safety.  Self care: PATIENT EDUCATION: Education details: PT educated pt on proper sleep positions and using body pillow for support. PT educated pt on new HEP and exam findings.  PT also educated pt on benefits  of internal muscle assessment next session. PT discussed how PFM pain requires an inter regional approach to decr. Tension. Person educated: Patient Education method: Explanation, Demonstration, Tactile cues, Verbal cues, and Handouts Education comprehension: verbalized understanding, returned demonstration, and needs further education   HOME EXERCISE PROGRAM: See above.   PT Short Term Goals - 05/16/22 1235       PT SHORT TERM GOAL #1   Title Pt will be IND in HEP to improve pain, strength, balance and flexibility. (ALL STGS TARGET DATE: 3 VISITS)    Baseline no HEP    Status New      PT SHORT TERM GOAL #2   Title Pt  will be able to verbalize and demo proper toileting posture, seated posture and scar mobilization to decr. pain and tension in pelvic floor musculature and fully empty bladder and decr. strain during bowel movements.    Baseline Unable to verbalize and demo proper posture.    Status New      PT SHORT TERM GOAL #3   Title Have pt complete FOTO and write goal as indicated    Baseline not performed 2/2 time constraints.    Status New              PT Long Term Goals - 05/16/22 1239       PT LONG TERM GOAL #1   Title Pt will demonstrate improved pelvic alignment, hip strength and posture in order to decr. back pain while caring for her child. (TARGE DATE FOR ALL LTGS: 8 VISITS)    Baseline incr. lumbar spine lordosis, incr. ant. pelvic tilt, 4-/5 gross seated hip abd/add, forward head posture, rounded shoulders    Status New      PT LONG TERM GOAL #2   Title Pt will demonstrat proper coordination (relaxation and contraction) of pelvic floor musculature with breathing to decr. vulvar, vaginal and PFM pain while inserting tampon and during intercourse.    Baseline Unable to coordinate with breath and pain with tampon insertion and intercourse-has not had OBGYN recently    Status New      PT LONG TERM GOAL #3   Title Perform internal muscle assessment and write goals as indicated.    Baseline not performed 2/2 time constraints    Status New      PT LONG TERM GOAL #4   Title Pt will report ability to maintain proper posture and engage TrA musculature at gym for 30+ minutes without incr. Back pain to maintain gains made in PT.    Baseline does not go to gym 2/2 back pain    Status New            Clinical impression statement: Today's skilled session focused on completing exam. Pt's spine mobility WNL. Pt's coccyx hypomobile with hx of falling on it as a child. B hip abd/add weakness 3+/5 and TTP B piriformis and hamstrings. Pt demonstrated progress during session as she reported less  tension with diaphragmatic breathing. Pt required rest breaks during clams 2/2 muscle fatigue and only able to complete 2 of 3 sets of 10 reps. Pt would continue to benefit from skilled PT to improve deficits listed above.    Plan: review HEP prn, manual therapy to hips/hamstrings, internal muscle assessment.    Rachyl Wuebker L, PT 05/24/2022, 3:00 PM    Zerita Boers, PT,DPT 05/24/22 3:00 PM Phone: [phone] Fax: [phone]

## 2022-05-30 ENCOUNTER — Encounter: Payer: Self-pay | Admitting: Obstetrics

## 2022-05-30 ENCOUNTER — Ambulatory Visit (INDEPENDENT_AMBULATORY_CARE_PROVIDER_SITE_OTHER): Payer: Medicaid Other | Admitting: Obstetrics

## 2022-05-30 VITALS — BP 109/75 | HR 88 | Ht 64.0 in | Wt 182.6 lb

## 2022-05-30 DIAGNOSIS — N941 Unspecified dyspareunia: Secondary | ICD-10-CM | POA: Diagnosis not present

## 2022-05-30 MED ORDER — PREMARIN 0.625 MG/GM VA CREA: 1.0000 | TOPICAL_CREAM | Freq: Every day | VAGINAL | 1 refills | Status: DC

## 2022-05-31 ENCOUNTER — Ambulatory Visit: Payer: Medicaid Other

## 2022-05-31 ENCOUNTER — Other Ambulatory Visit: Payer: Self-pay

## 2022-05-31 DIAGNOSIS — N941 Unspecified dyspareunia: Secondary | ICD-10-CM

## 2022-05-31 DIAGNOSIS — M6281 Muscle weakness (generalized): Secondary | ICD-10-CM

## 2022-05-31 DIAGNOSIS — R208 Other disturbances of skin sensation: Secondary | ICD-10-CM

## 2022-05-31 DIAGNOSIS — M533 Sacrococcygeal disorders, not elsewhere classified: Secondary | ICD-10-CM

## 2022-05-31 DIAGNOSIS — R278 Other lack of coordination: Secondary | ICD-10-CM

## 2022-05-31 NOTE — Therapy (Signed)
OUTPATIENT PHYSICAL THERAPY TREATMENT NOTE   Patient Name: Danielle Gillespie MRN: [id number] DOB:[birth year], 31 y.o., female Today's Date: 05/31/2022  PCP: Marnee Guarneri, NP  REFERRING PROVIDER: same as above   PT End of Session - 05/31/22 1404     Visit Number 3    Number of Visits 9    Date for PT Re-Evaluation 07/15/22    Authorization Type Medicaid    Authorization Time Period [facility] auth# [id number] 6/21-8/20 10 PT visits    Progress Note Due on Visit 10    PT Start Time 1401    PT Stop Time 1443    PT Time Calculation (min) 42 min    Activity Tolerance Patient tolerated treatment well    Behavior During Therapy [facility] for tasks assessed/performed             Past Medical History:  Diagnosis Date   Anxiety    Depression    Past Surgical History:  Procedure Laterality Date   NO PAST SURGERIES     Patient Active Problem List   Diagnosis Date Noted   Uses birth control 05/02/2022   Hemorrhoid 06/28/2021   Elevated low density lipoprotein (LDL) cholesterol level 04/28/2021   Vitamin D deficiency 04/28/2021   Neutrophilia 04/28/2021   History of postpartum depression 04/27/2021   Obesity 04/27/2021   Vapes non-nicotine containing substance 04/27/2021   Vitamin B12 deficiency 04/23/2021   Marijuana use 05/13/2020    REFERRING DIAG: vaginal pain  THERAPY DIAG:  Muscle weakness (generalized)  Other lack of coordination  Other disturbances of skin sensation  Dyspareunia in female  Sacrococcygeal disorders, not elsewhere classified  Rationale for Evaluation and Treatment Rehabilitation  PERTINENT HISTORY: hx of trauma to clitoral area and vaginal wall during L and D. yeast infection, hemorrhoids, vit B12 deficiency, hx of postpartum depression (no longer on meds), vit D deficiency, HDL   PRECAUTIONS: none  SUBJECTIVE: Pt reported she has only been able to perform HEP once a day 2/2 pt's dtr. Pt denied difficulty with HEP. Pt's OBGYN prescribed estrogen  cream to try in order to decr. Pain with intercourse. Next f/u with OB is in 07/2022.  PAIN:  Are you having pain? No     TODAY'S TREATMENT:  Manual therapy: Internal PFM assessment: pt agreeable to internal vaginal assessment. Pt performed diaphragmatic breathing to improve relaxation of PFM as B LEs shaking with PFM contraction. Strength: 3/5 with endurance hold for 2-3 sec. With B LE shaking 2/2 weakness. Cues to improve PFM lengthening with inhalation vs. Contraction. Pt's R side levator ani noted to have diffuse tension with 6-7/10 concordant pain which decr. With trigger point release. L side OI and pubococcygeus TTP 7-8/10 pain noted with decr. After manual therapy. PT showed pt images of PFM and explained tension and PMF roles. After manual therapy: pt reported less tension and she felt hopeful.  NMR: Access Code: [email or web address] URL: [email or web address] Date: 05/31/2022 Prepared by: Geoffry Paradise  Exercises - Supine Diaphragmatic Breathing  - 1 x daily - 7 x weekly - 2 sets - 5 reps - Supine Piriformis Stretch  - 2-3 x daily - 7 x weekly - 1 sets - 3 reps - 30-45 hold - Supine Figure 4 Piriformis Stretch  - 2-3 x daily - 7 x weekly - 1 sets - 3 reps - 30 hold - Clamshell  - 1 x daily - 3 x weekly - 3 sets - 10 reps - 1-2 hold - Seated Piriformis Stretch with Trunk Bend  -  2-3 x daily - 7 x weekly - 1 sets - 3 reps - 30-45 hold - Standing Hip Abduction with Anterior Support  - 1 x daily - 3 x weekly - 3 sets - 10 reps Performed with S for safety and cues/demo for proper technique for new HEP. S for safety. PT provided pt with standing options 2/2 providing childcare is challenging when pt perform supine activities.  Self care: PATIENT EDUCATION: Education details: PT educated pt on new HEP positions. PT educated pt on PFM roles and how scarring and tension can impact PFM pain.  Person educated: Patient Education method: Explanation, Demonstration, Tactile cues,  Verbal cues, and Handouts Education comprehension: verbalized understanding, returned demonstration, and needs further education   HOME EXERCISE PROGRAM: Medbridge: [email or web address]   PT Short Term Goals - 05/31/22 1419       PT SHORT TERM GOAL #1   Title Pt will be IND in HEP to improve pain, strength, balance and flexibility. (ALL STGS TARGET DATE: 3 VISITS)    Baseline no HEP. 05/31/22: IND with HEP.    Status Achieved      PT SHORT TERM GOAL #2   Title Pt will be able to verbalize and demo proper toileting posture, seated posture and scar mobilization to decr. pain and tension in pelvic floor musculature and fully empty bladder and decr. strain during bowel movements.    Baseline Unable to verbalize and demo proper posture. 05/31/22: pt able to demo and verbalize proper toileting and seated posture. Scar mob has not yet been taught.    Status Partially Met      PT SHORT TERM GOAL #3   Title Have pt complete FOTO and write goal as indicated    Baseline not performed 2/2 time constraints. 05/31/22: deferred to next visit.    Status Deferred              PT Long Term Goals - 05/16/22 1239       PT LONG TERM GOAL #1   Title Pt will demonstrate improved pelvic alignment, hip strength and posture in order to decr. back pain while caring for her child. (TARGE DATE FOR ALL LTGS: 8 VISITS)    Baseline incr. lumbar spine lordosis, incr. ant. pelvic tilt, 4-/5 gross seated hip abd/add, forward head posture, rounded shoulders    Status New      PT LONG TERM GOAL #2   Title Pt will demonstrat proper coordination (relaxation and contraction) of pelvic floor musculature with breathing to decr. vulvar, vaginal and PFM pain while inserting tampon and during intercourse.    Baseline Unable to coordinate with breath and pain with tampon insertion and intercourse-has not had OBGYN recently    Status New      PT LONG TERM GOAL #3   Title Perform internal muscle assessment and write goals as  indicated.    Baseline not performed 2/2 time constraints    Status New      PT LONG TERM GOAL #4   Title Pt will report ability to maintain proper posture and engage TrA musculature at gym for 30+ minutes without incr. Back pain to maintain gains made in PT.    Baseline does not go to gym 2/2 back pain    Status New            Clinical impression statement: Pt demonstrated progress as she met STG 1, partially met STG 2 and STG 3 deferred 2/2 time constraints. Pt noted to have TTP of  R and L PFM during internal muscle assessment, decr. Strength and endurance and difficulty coordinating lengthening/contraction of PFM with breath.  However, pt was able to perform stronger PFM contraction with exhalation with cues. Pt continues to experience BLE weakness and postural dysfunction. Therefore, pt would continue to benefit from skilled PT to improve deficits listed above.    Plan: review HEP prn, manual therapy to hips/hamstrings, internal muscle manual therapy.    Niajah Sipos L, PT 05/31/2022, 2:49 PM    Geoffry Paradise, PT,DPT 05/31/22 2:49 PM Phone: [phone] Fax: [phone]

## 2022-06-12 ENCOUNTER — Telehealth: Payer: Medicaid Other | Admitting: Physician Assistant

## 2022-06-12 DIAGNOSIS — L559 Sunburn, unspecified: Secondary | ICD-10-CM

## 2022-06-12 NOTE — Progress Notes (Signed)
E-Visit for Sunburn  We are sorry that you are not feeling well.  Here is how we plan to help!  Based on what you have shared with me, I'd like to share with you a treatment plan for sunburn.   Most sunburn is a first degree burn that turns the skin pink or red.  It can be painful to touch.  If you stayed in the sun for a prolonged period this might have progressed to a second degree burn with blistering!  Usually the pain and swelling starts after about 4 hours, peaks at 24 hours and begins to improve after 48 hours or about 2 days.  REMEMBER prolonged exposure to the sun increases your risk of skin cancer so use sunscreen before you go outside!  We will give you more information about sunscreen use later in your care plan.  Your sunburn can be managed by self-care at home.  Please use the following care guide to manage your sunburn.  If you symptoms worsen, you have other questions or concerns, or you develop any of the warnings signs listed in your care plan you will need to seek a face to face visit with a provider without waiting!  Home Care Advice for Treating Mild Sunburn:  Take Ibuprofen (Advil, Motrin) for pain relief as soon as possible.  The adult dosage is up to 600 mg every 6 hours.  Starting within 6 hours of sun exposure may greatly reduce your discomfort.  If you cannot take Ibuprofen you may use Acetaminophen instead. Do not take Ibuprofen if you have stomach problems, kidney disease or are pregnant. Do not take Ibuprofen if you have been told by your doctor or pharmacist to avoid this class of drugs. Do not take Acetaminophen if you have liver disease. Read the package warnings on any medication that you take!  2.  Use a steroid cream on the affected skin.  If you apply an over the counter steroid       cream as soon as possible and repeat it three times a day it may reduce the pain and      and swelling.  Until you get the steroid cream you may start with a moistening  cream      cream or aloe gel.  3. For second or third degree sunburn with painful blistering, you can use an over the         counter product Burn Jel Plus Pain Relieving Gel. Apply in a thick even layer over the     affected area not more than 3 to 4 times daily If you need to cover the area to protect      it from friction of clothing, you can use Moist Burn Pads such as Hydrogel Burn Pads       which are available over the counter.  4.  Apply cool compresses to the burned areas several times a day.  5.  Avoid soap on the sunburned areas.  6.  Drink plenty of water.  It is easy to get dehydrated from prolong time in the sun      Outdoors.  7.  For any broken blisters: Trim off the dead skin with fine scissors.  It is wise to clean the scissor with alcohol before use. Apply antibiotic ointments to the blister.  Apply twice a day for three days.  There are triple antibiotic ointments with topical pain relievers available at stores. Caution:leave intact blisters alone.  They are protecting the  skin and will allow it to heal.  8.  Taking Vitamin C orally may reduce sun damage to your skin.  Follow the      Instructions on the bottle.  The recommended adult dosage is 2 grams.  What to Expect:  Pain usually stops after 2 or 3 days. Skin flaking and peeling usually occurs for 3-7 days after a sunburn.  Call your provider if:  You feel very weak or have difficulty standing. Blister develops on your face. You become sensitive to light because of eye pain. Your skin looks infected (red streaks, puss or worsening tenderness after 48 hours. You feel you should be seen.  Preventing Sunburns:  Apply 20-30 SPF sunscreen to your skin before going into the sun. Reapply every 2-4 hours or after sweating or swimming. Sunscreens protect from sunburns but do not completely prevent skin damage.  Wynelle Link exposure still increases your risk of premature aging and skin cancers.  Thank you for choosing an  e-visit.  Your e-visit answers were reviewed by a board certified advanced clinical practitioner to complete your personal care plan. Depending upon the condition, your plan could have included both over the counter or prescription medications.  Please review your pharmacy choice. Make sure the pharmacy is open so you can pick up prescription now. If there is a problem, you may contact your provider through [employer] and have the prescription routed to another pharmacy.  Your safety is important to [place]. If you have drug allergies check your prescription carefully.   For the next 24 hours you can use MyChart to ask questions about today's visit, request a non-urgent call back, or ask for a work or school excuse. You will get an email in the next two days asking about your experience. I hope that your e-visit has been valuable and will speed your recovery.  I provided 5 minutes of non face-to-face time during this encounter for chart review and documentation.

## 2022-06-14 ENCOUNTER — Ambulatory Visit: Payer: Medicaid Other

## 2022-06-21 ENCOUNTER — Other Ambulatory Visit: Payer: Self-pay

## 2022-06-21 ENCOUNTER — Ambulatory Visit: Payer: Medicaid Other | Attending: Nurse Practitioner

## 2022-06-21 DIAGNOSIS — R208 Other disturbances of skin sensation: Secondary | ICD-10-CM | POA: Insufficient documentation

## 2022-06-21 DIAGNOSIS — R278 Other lack of coordination: Secondary | ICD-10-CM | POA: Insufficient documentation

## 2022-06-21 DIAGNOSIS — N941 Unspecified dyspareunia: Secondary | ICD-10-CM | POA: Diagnosis present

## 2022-06-21 DIAGNOSIS — M533 Sacrococcygeal disorders, not elsewhere classified: Secondary | ICD-10-CM | POA: Insufficient documentation

## 2022-06-21 DIAGNOSIS — M6281 Muscle weakness (generalized): Secondary | ICD-10-CM | POA: Insufficient documentation

## 2022-06-21 NOTE — Therapy (Addendum)
OUTPATIENT PHYSICAL THERAPY TREATMENT NOTE   Patient Name: Danielle Gillespie MRN: [id number] DOB:1991/05/27, 31 y.o., female Today's Date: 06/21/2022  PCP: Marnee Guarneri, NP  REFERRING PROVIDER: same as above   PT End of Session - 06/21/22 1359     Visit Number 4    Number of Visits 9    Date for PT Re-Evaluation 07/15/22    Authorization Type Medicaid    Authorization Time Period [facility] auth# [id number] 6/21-8/20 10 PT visits    Progress Note Due on Visit 10    PT Start Time 1400    PT Stop Time 1440    PT Time Calculation (min) 40 min    Activity Tolerance Patient tolerated treatment well    Behavior During Therapy [facility] for tasks assessed/performed             Past Medical History:  Diagnosis Date   Anxiety    Depression    Past Surgical History:  Procedure Laterality Date   NO PAST SURGERIES     Patient Active Problem List   Diagnosis Date Noted   Uses birth control 05/02/2022   Hemorrhoid 06/28/2021   Elevated low density lipoprotein (LDL) cholesterol level 04/28/2021   Vitamin D deficiency 04/28/2021   Neutrophilia 04/28/2021   History of postpartum depression 04/27/2021   Obesity 04/27/2021   Vapes non-nicotine containing substance 04/27/2021   Vitamin B12 deficiency 04/23/2021   Marijuana use 05/13/2020    REFERRING DIAG: vaginal pain  THERAPY DIAG:  Muscle weakness (generalized)  Other lack of coordination  Other disturbances of skin sensation  Dyspareunia in female  Sacrococcygeal disorders, not elsewhere classified  Rationale for Evaluation and Treatment Rehabilitation  PERTINENT HISTORY: hx of trauma to clitoral area and vaginal wall during L and D. yeast infection, hemorrhoids, vit B12 deficiency, hx of postpartum depression (no longer on meds), vit D deficiency, HDL   PRECAUTIONS: none  SUBJECTIVE: Pt had to cancel last appt. 2/2 her dtr had a MD appt. Pt on vacation the prior week. Pt had intercourse last week and she was a little  sore today. She had pain with initial penetration but was fine afterwards. Pt has been performing HEP. Pt states the standing hip abd. Is easier to perform 2/2 watching her child and it's getting easier.   PAIN:  Are you having pain? Yes. Pelvic floor pain 5/10 with wiping (at worst) and 2/10 initially sitting down. Pt hasn't tried anything yet to ease pain. Pt [place] estrogen cream at the beach so she hasn't tried it yet.      TODAY'S TREATMENT:  Manual therapy: Internal PFM assessment: pt agreeable to internal vaginal assessment. Pt performed diaphragmatic breathing to improve relaxation of PFM. R sided TTP 8/10 and L side TTP 9/10 pain reported. B OI incr. Tension vs. Levator ani. Incr. Time for trigger point release on B PFM. After manual therapy: pt reported pain is approx. 4-5/10.    NMR: Access Code: [email or web address] URL: [email or web address] Date: 05/31/2022 Prepared by: Geoffry Paradise  Exercises - Standing Hip Abduction with Anterior Support  - 1 x daily - 3 x weekly - 3 sets 10 reps with yellow band. Performed with S for safety and cues/demo for proper technique for progressed HEP.  Self care: PATIENT EDUCATION: Education details: PT educated pt on benefits of pelvic wand for PFM trigger point release and dilators to decr. Pain. PT provided pamphlet with info and discount code. Person educated: Patient Education method: Explanation, Demonstration, Tactile cues, Verbal cues, and Handouts Education comprehension:  verbalized understanding, returned demonstration, and needs further education   HOME EXERCISE PROGRAM: [employer]: [email or web address]   PT Short Term Goals - 05/31/22 1419       PT SHORT TERM GOAL #1   Title Pt will be IND in HEP to improve pain, strength, balance and flexibility. (ALL STGS TARGET DATE: 3 VISITS)    Baseline no HEP. 05/31/22: IND with HEP.    Status Achieved      PT SHORT TERM GOAL #2   Title Pt will be able to verbalize and demo proper  toileting posture, seated posture and scar mobilization to decr. pain and tension in pelvic floor musculature and fully empty bladder and decr. strain during bowel movements.    Baseline Unable to verbalize and demo proper posture. 05/31/22: pt able to demo and verbalize proper toileting and seated posture. Scar mob has not yet been taught.    Status Partially Met      PT SHORT TERM GOAL #3   Title Have pt complete FOTO and write goal as indicated    Baseline not performed 2/2 time constraints. 05/31/22: deferred to next visit.    Status Deferred              PT Long Term Goals - 05/16/22 1239       PT LONG TERM GOAL #1   Title Pt will demonstrate improved pelvic alignment, hip strength and posture in order to decr. back pain while caring for her child. (TARGE DATE FOR ALL LTGS: 8 VISITS)    Baseline incr. lumbar spine lordosis, incr. ant. pelvic tilt, 4-/5 gross seated hip abd/add, forward head posture, rounded shoulders    Status New      PT LONG TERM GOAL #2   Title Pt will demonstrat proper coordination (relaxation and contraction) of pelvic floor musculature with breathing to decr. vulvar, vaginal and PFM pain while inserting tampon and during intercourse.    Baseline Unable to coordinate with breath and pain with tampon insertion and intercourse-has not had OBGYN recently    Status New      PT LONG TERM GOAL #3   Title Perform internal muscle assessment and write goals as indicated.    Baseline not performed 2/2 time constraints    Status New      PT LONG TERM GOAL #4   Title Pt will report ability to maintain proper posture and engage TrA musculature at gym for 30+ minutes without incr. Back pain to maintain gains made in PT.    Baseline does not go to gym 2/2 back pain    Status New            Clinical impression statement: Pt demonstrated improved strength as she was able to progress standing hip abd. With yellow therband. Pt continues to experience TTP of R and L  PFM (especially B OI)d uring internal muscle assessment. However, pain improved after manual therapy. Pt continues to experience  pain, BLE weakness , decr. Flexibility and postural dysfunction. Therefore, pt would continue to benefit from skilled PT to improve deficits listed above.    Plan: progress strengthening (core/hips/LE), manual therapy to hips/hamstrings, prn internal muscle manual therapy.    Chynah Orihuela L, PT 06/21/2022, 2:01 PM    Geoffry Paradise, PT,DPT 06/21/22 2:01 PM Phone: [phone] Fax: [phone]

## 2022-06-28 ENCOUNTER — Ambulatory Visit: Payer: Medicaid Other

## 2022-06-28 ENCOUNTER — Other Ambulatory Visit: Payer: Self-pay

## 2022-06-28 DIAGNOSIS — M533 Sacrococcygeal disorders, not elsewhere classified: Secondary | ICD-10-CM

## 2022-06-28 DIAGNOSIS — M6281 Muscle weakness (generalized): Secondary | ICD-10-CM | POA: Diagnosis not present

## 2022-06-28 DIAGNOSIS — R208 Other disturbances of skin sensation: Secondary | ICD-10-CM

## 2022-06-28 DIAGNOSIS — R278 Other lack of coordination: Secondary | ICD-10-CM

## 2022-06-28 DIAGNOSIS — N941 Unspecified dyspareunia: Secondary | ICD-10-CM

## 2022-06-28 NOTE — Therapy (Addendum)
OUTPATIENT PHYSICAL THERAPY TREATMENT NOTE   Patient Name: Danielle Gillespie MRN: [id number] DOB:1991-10-16, 31 y.o., female Today's Date: 06/28/2022  PCP: Marnee Guarneri, NP  REFERRING PROVIDER: same as above   PT End of Session - 06/28/22 1406     Visit Number 5    Number of Visits 9    Date for PT Re-Evaluation 07/15/22    Authorization Type Medicaid    Authorization Time Period [facility] auth# [id number] 6/21-8/20 10 PT visits    Progress Note Due on Visit 10    PT Start Time 1407   pt late 2/2 elevator and then using restroom   PT Stop Time 1449    PT Time Calculation (min) 42 min    Activity Tolerance Patient tolerated treatment well    Behavior During Therapy [facility] for tasks assessed/performed             Past Medical History:  Diagnosis Date   Anxiety    Depression    Past Surgical History:  Procedure Laterality Date   NO PAST SURGERIES     Patient Active Problem List   Diagnosis Date Noted   Uses birth control 05/02/2022   Hemorrhoid 06/28/2021   Elevated low density lipoprotein (LDL) cholesterol level 04/28/2021   Vitamin D deficiency 04/28/2021   Neutrophilia 04/28/2021   History of postpartum depression 04/27/2021   Obesity 04/27/2021   Vapes non-nicotine containing substance 04/27/2021   Vitamin B12 deficiency 04/23/2021   Marijuana use 05/13/2020    REFERRING DIAG: vaginal pain  THERAPY DIAG:  Muscle weakness (generalized)  Other lack of coordination  Other disturbances of skin sensation  Sacrococcygeal disorders, not elsewhere classified  Dyspareunia in female  Rationale for Evaluation and Treatment Rehabilitation  PERTINENT HISTORY: hx of trauma to clitoral area and vaginal wall during L and D. yeast infection, hemorrhoids, vit B12 deficiency, hx of postpartum depression (no longer on meds), vit D deficiency, HDL   PRECAUTIONS: none  SUBJECTIVE: Pt reported her dtr has hand, foot and mouth so she has not had much rest the last week  and was only able to perform HEP about 3 days last week. Pt is very tired. Pt did have intercourse two days ago and performed breathing exercises before and during and it helped decr. Pain. Pt is still a little sore today but not as bad as she typically is after intercourse. Pt hasn't tried estrogen cream.  PAIN:  Are you having pain? Yes. Pelvic floor/introitus pain 2/10 at rest. Breathing helps reduce pain.     TODAY'S TREATMENT:  Manual therapy: Internal PFM assessment: pt agreeable to internal vaginal assessment. Pt performed diaphragmatic breathing to improve relaxation of PFM. R sided TTP 4-5/10 and L side TTP 6/10 pain reported. B OI incr. Tension vs. Levator ani. Incr. Time for trigger point release on B PFM. After manual therapy: pt reported pain is approx. 0/10 on R side and 1/10 on the L side.   PT assessed vaginal scar from delivery and performed scar mobilization and taught pt how to perform scar mobilization. Pain 1/10 after scar mobilization.     Self care: PATIENT EDUCATION: Education details: PT educated pt on scar mobilization. Person educated: Patient Education method: Explanation, Demonstration, Tactile cues, Verbal cues, and Handouts Education comprehension: verbalized understanding, returned demonstration, and needs further education   HOME EXERCISE PROGRAM: Medbridge: [email or web address]   PT Short Term Goals - 06/28/22 1419       PT SHORT TERM GOAL #1   Title Pt will be IND  in HEP to improve pain, strength, balance and flexibility. (ALL STGS TARGET DATE: 3 VISITS)    Baseline no HEP. 05/31/22: IND with HEP.    Status Achieved      PT SHORT TERM GOAL #2   Title Pt will be able to verbalize and demo proper toileting posture, seated posture and scar mobilization to decr. pain and tension in pelvic floor musculature and fully empty bladder and decr. strain during bowel movements.    Baseline Unable to verbalize and demo proper posture. 05/31/22: pt able to demo and  verbalize proper toileting and seated posture. Scar mob has not yet been taught.    Status Partially Met      PT SHORT TERM GOAL #3   Title Have pt complete FOTO and write goal as indicated    Baseline not performed 2/2 time constraints. 05/31/22: deferred to next visit.    Status Deferred              PT Long Term Goals - 06/28/22 1239       PT LONG TERM GOAL #1   Title Pt will demonstrate improved pelvic alignment, hip strength and posture in order to decr. back pain while caring for her child. (TARGE DATE FOR ALL LTGS: 8 VISITS)    Baseline incr. lumbar spine lordosis, incr. ant. pelvic tilt, 4-/5 gross seated hip abd/add, forward head posture, rounded shoulders    Status New      PT LONG TERM GOAL #2   Title Pt will demonstrat proper coordination (relaxation and contraction) of pelvic floor musculature with breathing to decr. vulvar, vaginal and PFM pain while inserting tampon and during intercourse.    Baseline Unable to coordinate with breath and pain with tampon insertion and intercourse-has not had OBGYN recently    Status New      PT LONG TERM GOAL #3   Title Perform internal muscle assessment and write goals as indicated.    Baseline not performed 2/2 time constraints    Status New      PT LONG TERM GOAL #4   Title Pt will report ability to maintain proper posture and engage TrA musculature at gym for 30+ minutes without incr. Back pain to maintain gains made in PT.    Baseline does not go to gym 2/2 back pain    Status New            Clinical impression statement: Pt demonstrated progress as she reported decr. During intercourse and during internal muscle assessment. Pt's vaginal scar likely causing introitus and clitoris pain. Pt continues to experience  pain, BLE weakness , decr. Flexibility and postural dysfunction. Therefore, pt would continue to benefit from skilled PT to improve deficits listed above.    Plan: demo how to use pt's dilators when then  arrive, progress strengthening (core/hips/LE), manual therapy to hips/hamstrings, prn internal muscle manual therapy.    Alvia Jablonski L, PT 06/28/2022, 2:53 PM    Geoffry Paradise, PT,DPT 06/28/22 2:53 PM Phone: [phone] Fax: [phone]

## 2022-07-05 ENCOUNTER — Other Ambulatory Visit: Payer: Self-pay

## 2022-07-05 ENCOUNTER — Ambulatory Visit: Payer: Medicaid Other | Attending: Nurse Practitioner

## 2022-07-05 DIAGNOSIS — M6281 Muscle weakness (generalized): Secondary | ICD-10-CM | POA: Diagnosis present

## 2022-07-05 DIAGNOSIS — N941 Unspecified dyspareunia: Secondary | ICD-10-CM | POA: Insufficient documentation

## 2022-07-05 DIAGNOSIS — M533 Sacrococcygeal disorders, not elsewhere classified: Secondary | ICD-10-CM | POA: Diagnosis present

## 2022-07-05 DIAGNOSIS — R208 Other disturbances of skin sensation: Secondary | ICD-10-CM | POA: Diagnosis present

## 2022-07-05 DIAGNOSIS — R278 Other lack of coordination: Secondary | ICD-10-CM | POA: Insufficient documentation

## 2022-07-05 NOTE — Therapy (Signed)
OUTPATIENT PHYSICAL THERAPY TREATMENT NOTE   Patient Name: Danielle Gillespie MRN: [id number] DOB:05/02/1991, 31 y.o., female Today's Date: 07/05/2022  PCP: Marnee Guarneri, NP  REFERRING PROVIDER: same as above   PT End of Session - 07/05/22 1406     Visit Number 6    Number of Visits 9    Date for PT Re-Evaluation 07/15/22    Authorization Type Medicaid    Authorization Time Period [facility] auth# [id number] 6/21-8/20 10 PT visits    Progress Note Due on Visit 10    PT Start Time 1404    PT Stop Time 1445    PT Time Calculation (min) 41 min    Activity Tolerance Patient tolerated treatment well    Behavior During Therapy [facility] for tasks assessed/performed             Past Medical History:  Diagnosis Date   Anxiety    Depression    Past Surgical History:  Procedure Laterality Date   NO PAST SURGERIES     Patient Active Problem List   Diagnosis Date Noted   Uses birth control 05/02/2022   Hemorrhoid 06/28/2021   Elevated low density lipoprotein (LDL) cholesterol level 04/28/2021   Vitamin D deficiency 04/28/2021   Neutrophilia 04/28/2021   History of postpartum depression 04/27/2021   Obesity 04/27/2021   Vapes non-nicotine containing substance 04/27/2021   Vitamin B12 deficiency 04/23/2021   Marijuana use 05/13/2020    REFERRING DIAG: vaginal pain  THERAPY DIAG:  Muscle weakness (generalized)  Other lack of coordination  Other disturbances of skin sensation  Sacrococcygeal disorders, not elsewhere classified  Dyspareunia in female  Rationale for Evaluation and Treatment Rehabilitation  PERTINENT HISTORY: hx of trauma to clitoral area and vaginal wall during L and D. yeast infection, hemorrhoids, vit B12 deficiency, hx of postpartum depression (no longer on meds), vit D deficiency, HDL   PRECAUTIONS: none  SUBJECTIVE: Pt reported she received dilators and wand and wishes to try them in clinic. Pt reported she was pretty sore after performing scar  mobilization the first time herself but she has eased pressure and performed further from clitoris. Pt reported no pain in pelvic floor since last visit but hasn't had intercourse.   PAIN:  Are you having pain? no     TODAY'S TREATMENT:  Manual therapy: Today's skilled session focused on educating pt on how to use dilators (started with size 1 and tolerated size 2) and at home. 5 minutes insertion max to start daily. Then progressing to moving dilator lat/lat and inf/post and circular motions as tolerated. Using smallest size tolerated and working her way up to larger sizes.   PT then performed internal muscle trigger point release to levator ani with incr. Pain noted with deeper penetration at B puborectalis 4/10 Bilat. With pain decr. To 0/10 after manual therapy.    Self care: PATIENT EDUCATION: Education details: PT educated pt on how to use dilators to decr. PFM pain, size 2 for 5 minutes daily and how to progress to int/sup and lat/lat and circular motions as tolerated.  Person educated: Patient Education method: Explanation, Demonstration, Tactile cues, Verbal cues, and Handouts Education comprehension: verbalized understanding, returned demonstration, and needs further education   HOME EXERCISE PROGRAM: Medbridge: [email or web address]   PT Short Term Goals - 07/05/22 1419       PT SHORT TERM GOAL #1   Title Pt will be IND in HEP to improve pain, strength, balance and flexibility. (ALL STGS TARGET DATE: 3 VISITS)  Baseline no HEP. 05/31/22: IND with HEP.    Status Achieved      PT SHORT TERM GOAL #2   Title Pt will be able to verbalize and demo proper toileting posture, seated posture and scar mobilization to decr. pain and tension in pelvic floor musculature and fully empty bladder and decr. strain during bowel movements.    Baseline Unable to verbalize and demo proper posture. 05/31/22: pt able to demo and verbalize proper toileting and seated posture. Scar mob has not yet been  taught.    Status Partially Met      PT SHORT TERM GOAL #3   Title Have pt complete FOTO and write goal as indicated    Baseline not performed 2/2 time constraints. 05/31/22: deferred to next visit.    Status Deferred              PT Long Term Goals - 07/05/22 1239       PT LONG TERM GOAL #1   Title Pt will demonstrate improved pelvic alignment, hip strength and posture in order to decr. back pain while caring for her child. (TARGE DATE FOR ALL LTGS: 8 VISITS)    Baseline incr. lumbar spine lordosis, incr. ant. pelvic tilt, 4-/5 gross seated hip abd/add, forward head posture, rounded shoulders    Status New      PT LONG TERM GOAL #2   Title Pt will demonstrat proper coordination (relaxation and contraction) of pelvic floor musculature with breathing to decr. vulvar, vaginal and PFM pain while inserting tampon and during intercourse.    Baseline Unable to coordinate with breath and pain with tampon insertion and intercourse-has not had OBGYN recently    Status New      PT LONG TERM GOAL #3   Title Perform internal muscle assessment and write goals as indicated.    Baseline not performed 2/2 time constraints    Status New      PT LONG TERM GOAL #4   Title Pt will report ability to maintain proper posture and engage TrA musculature at gym for 30+ minutes without incr. Back pain to maintain gains made in PT.    Baseline does not go to gym 2/2 back pain    Status New            Clinical impression statement: Pt demonstrated progress as she was able to tolerate use of size 2 dilator with pain upon insertion decr. With size 2 dilator vs. Size 1 dilator. Pt also had decr. Pain after trigger point release of levator ani. Pt continues to experience  pain, BLE weakness , decr. Flexibility and postural dysfunction. Therefore, pt would continue to benefit from skilled PT to improve deficits listed above.    Plan: progress strengthening (core/hips/LE), manual therapy to hips/hamstrings,  prn internal muscle manual therapy, progress dilators as tolerated.    Paulino Cork L, PT 07/05/2022, 2:07 PM    Geoffry Paradise, PT,DPT 07/05/22 2:07 PM Phone: [phone] Fax: [phone]

## 2022-07-12 ENCOUNTER — Ambulatory Visit: Payer: Medicaid Other

## 2022-07-12 ENCOUNTER — Other Ambulatory Visit: Payer: Self-pay

## 2022-07-12 DIAGNOSIS — M6281 Muscle weakness (generalized): Secondary | ICD-10-CM | POA: Diagnosis not present

## 2022-07-12 DIAGNOSIS — N941 Unspecified dyspareunia: Secondary | ICD-10-CM

## 2022-07-12 DIAGNOSIS — R278 Other lack of coordination: Secondary | ICD-10-CM

## 2022-07-12 DIAGNOSIS — R208 Other disturbances of skin sensation: Secondary | ICD-10-CM

## 2022-07-12 DIAGNOSIS — M533 Sacrococcygeal disorders, not elsewhere classified: Secondary | ICD-10-CM

## 2022-07-12 NOTE — Therapy (Signed)
OUTPATIENT PHYSICAL THERAPY TREATMENT NOTE   Patient Name: Danielle Gillespie MRN: [id number] DOB:13-Sep-1991, 31 y.o., female Today's Date: 07/12/2022  PCP: Marnee Guarneri, NP  REFERRING PROVIDER: same as above   PT End of Session - 07/12/22 1409     Visit Number 7    Number of Visits 9    Date for PT Re-Evaluation 07/15/22    Authorization Type Medicaid    Authorization Time Period [facility] auth# [id number] 6/21-8/20 10 PT visits    Progress Note Due on Visit 10    PT Start Time 1406    PT Stop Time 1445    PT Time Calculation (min) 39 min    Activity Tolerance Patient tolerated treatment well    Behavior During Therapy [facility] for tasks assessed/performed             Past Medical History:  Diagnosis Date   Anxiety    Depression    Past Surgical History:  Procedure Laterality Date   NO PAST SURGERIES     Patient Active Problem List   Diagnosis Date Noted   Uses birth control 05/02/2022   Hemorrhoid 06/28/2021   Elevated low density lipoprotein (LDL) cholesterol level 04/28/2021   Vitamin D deficiency 04/28/2021   Neutrophilia 04/28/2021   History of postpartum depression 04/27/2021   Obesity 04/27/2021   Vapes non-nicotine containing substance 04/27/2021   Vitamin B12 deficiency 04/23/2021   Marijuana use 05/13/2020    REFERRING DIAG: vaginal pain  THERAPY DIAG:  Muscle weakness (generalized)  Other lack of coordination  Other disturbances of skin sensation  Sacrococcygeal disorders, not elsewhere classified  Dyspareunia in female  Rationale for Evaluation and Treatment Rehabilitation  PERTINENT HISTORY: hx of trauma to clitoral area and vaginal wall during L and D. yeast infection, hemorrhoids, vit B12 deficiency, hx of postpartum depression (no longer on meds), vit D deficiency, HDL   PRECAUTIONS: none  SUBJECTIVE: Pt reported she has cramps today and it's day 6. Bleeding has been more heavy this time as well. Pt was able to use tampons a few day  (light protection) and is sore/burning from using tampons. Used dilators one day since last visit 2/2 period, no pain during insertion but dilator did push out but propping up on pillow helped. No intercourse since last visit 2/2 period. Pt almost cancelled 2/2 pain but was curious if PT could assist in pain.  PAIN:  Are you having pain? Yes. Uterine cramps,  now 6/10, at worst 9/10. Tylenol helps a little. Nothing really helps but stretching incr. Pain-feels stabbing in uterus.       TODAY'S TREATMENT:  Self care:  -hooklying diaphragmatic breathing and box breathing to decr. Pain, with and without heating pack on lower abdomenand with and without performing body scan to reduce pain. -pt then performed 5 minutes of meditation with breathing while performing body scan. Pain 1-2/10 after meditation and breathing.  Pt required cues for box breathing technique.     Self care: PATIENT EDUCATION: Education details: PT educated pt on discussing incr. Bleeding and pain during period and asking about medication options.  Person educated: Patient Education method: Explanation, Demonstration, Tactile cues, Verbal cues, and Handouts Education comprehension: verbalized understanding, returned demonstration, and needs further education   HOME EXERCISE PROGRAM: Medbridge: [email or web address]   PT Short Term Goals - 07/12/22 1419       PT SHORT TERM GOAL #1   Title Pt will be IND in HEP to improve pain, strength, balance and flexibility. (ALL STGS TARGET DATE:  3 VISITS)    Baseline no HEP. 05/31/22: IND with HEP.    Status Achieved      PT SHORT TERM GOAL #2   Title Pt will be able to verbalize and demo proper toileting posture, seated posture and scar mobilization to decr. pain and tension in pelvic floor musculature and fully empty bladder and decr. strain during bowel movements.    Baseline Unable to verbalize and demo proper posture. 05/31/22: pt able to demo and verbalize proper toileting and seated  posture. Scar mob has not yet been taught.    Status Partially Met      PT SHORT TERM GOAL #3   Title Have pt complete FOTO and write goal as indicated    Baseline not performed 2/2 time constraints. 05/31/22: deferred to next visit.    Status Deferred              PT Long Term Goals - 07/12/22 1239       PT LONG TERM GOAL #1   Title Pt will demonstrate improved pelvic alignment, hip strength and posture in order to decr. back pain while caring for her child. (TARGE DATE FOR ALL LTGS: 8 VISITS)    Baseline incr. lumbar spine lordosis, incr. ant. pelvic tilt, 4-/5 gross seated hip abd/add, forward head posture, rounded shoulders    Status New      PT LONG TERM GOAL #2   Title Pt will demonstrat proper coordination (relaxation and contraction) of pelvic floor musculature with breathing to decr. vulvar, vaginal and PFM pain while inserting tampon and during intercourse.    Baseline Unable to coordinate with breath and pain with tampon insertion and intercourse-has not had OBGYN recently    Status New      PT LONG TERM GOAL #3   Title Perform internal muscle assessment and write goals as indicated.    Baseline not performed 2/2 time constraints    Status New      PT LONG TERM GOAL #4   Title Pt will report ability to maintain proper posture and engage TrA musculature at gym for 30+ minutes without incr. Back pain to maintain gains made in PT.    Baseline does not go to gym 2/2 back pain    Status New            Clinical impression statement: Today's skilled session focused on reducing intense pain from period cramping. Session ended early 2/2 pain incr. With stretches or exercises. PT encouraged pt to notify MD of incr. Bleeding/pain during period as this was day 6 of period and pain still mod-severe. Pt continues to experience  pain, BLE weakness , decr. Flexibility and postural dysfunction. Therefore, pt would continue to benefit from skilled PT to improve deficits listed above.     Plan: review meditation prn, progress strengthening (core/hips/LE), manual therapy to hips/hamstrings, prn internal muscle manual therapy, progress dilators as tolerated.    Chace Klippel L, PT 07/12/2022, 2:09 PM    Geoffry Paradise, PT,DPT 07/12/22 2:09 PM Phone: [phone] Fax: [phone]

## 2022-07-19 ENCOUNTER — Telehealth: Payer: Medicaid Other | Admitting: Physician Assistant

## 2022-07-19 ENCOUNTER — Ambulatory Visit: Payer: Medicaid Other

## 2022-07-19 DIAGNOSIS — N760 Acute vaginitis: Secondary | ICD-10-CM | POA: Diagnosis not present

## 2022-07-20 MED ORDER — FLUCONAZOLE 150 MG PO TABS: ORAL_TABLET | ORAL | 0 refills | Status: DC

## 2022-07-20 NOTE — Progress Notes (Signed)

## 2022-07-26 ENCOUNTER — Ambulatory Visit: Payer: Medicaid Other

## 2022-07-26 ENCOUNTER — Encounter: Payer: Medicaid Other | Admitting: Obstetrics

## 2022-07-26 ENCOUNTER — Other Ambulatory Visit: Payer: Self-pay

## 2022-07-26 DIAGNOSIS — M6281 Muscle weakness (generalized): Secondary | ICD-10-CM

## 2022-07-26 DIAGNOSIS — M533 Sacrococcygeal disorders, not elsewhere classified: Secondary | ICD-10-CM

## 2022-07-26 DIAGNOSIS — R208 Other disturbances of skin sensation: Secondary | ICD-10-CM

## 2022-07-26 DIAGNOSIS — R278 Other lack of coordination: Secondary | ICD-10-CM

## 2022-07-26 DIAGNOSIS — N941 Unspecified dyspareunia: Secondary | ICD-10-CM
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
## 2022-08-04 NOTE — Progress Notes (Unsigned)
    GYNECOLOGY PROGRESS NOTE  Subjective:    Patient ID: Danielle Gillespie, female    DOB: 12/21/1990, 32 y.o.   MRN: [id number]  HPI  Patient is a 31 y.o. G16P1001 female who presents for   {Common ambulatory SmartLinks:19316}  Review of Systems {ros; complete:30496}   Objective:   not currently breastfeeding. There is no height or weight on file to calculate BMI. General appearance: {general exam:16600} Abdomen: {abdominal exam:16834} Pelvic: {pelvic exam:16852::"cervix normal in appearance","external genitalia normal","no adnexal masses or tenderness","no cervical motion tenderness","rectovaginal septum normal","uterus normal size, shape, and consistency","vagina normal without discharge"} Extremities: {extremity exam:5109} Neurologic: {neuro exam:17854}   Assessment:   No diagnosis found.   Plan:   There are no diagnoses linked to this encounter.

## 2022-08-08 ENCOUNTER — Ambulatory Visit (INDEPENDENT_AMBULATORY_CARE_PROVIDER_SITE_OTHER): Payer: Medicaid Other | Admitting: Obstetrics and Gynecology

## 2022-08-08 VITALS — BP 119/75 | HR 85 | Ht 64.0 in | Wt 181.9 lb

## 2022-08-08 DIAGNOSIS — N941 Unspecified dyspareunia: Secondary | ICD-10-CM

## 2022-08-08 DIAGNOSIS — R102 Pelvic and perineal pain: Secondary | ICD-10-CM

## 2022-08-08 DIAGNOSIS — N9089 Other specified noninflammatory disorders of vulva and perineum: Secondary | ICD-10-CM

## 2022-08-08 DIAGNOSIS — E669 Obesity, unspecified: Secondary | ICD-10-CM | POA: Diagnosis not present

## 2022-08-08 MED ORDER — CYANOCOBALAMIN 1000 MCG/ML IJ SOLN
1000.0000 ug | Freq: Once | INTRAMUSCULAR | Status: AC
  Administered 2022-08-08: 1000 ug via INTRAMUSCULAR

## 2022-08-08 MED ORDER — PHENTERMINE HCL 37.5 MG PO CAPS: 37.5000 mg | ORAL_CAPSULE | ORAL | 0 refills | Status: DC

## 2022-08-08 MED ORDER — PHENTERMINE HCL 30 MG PO CAPS: 30.0000 mg | ORAL_CAPSULE | ORAL | 0 refills | Status: DC

## 2022-08-09 ENCOUNTER — Ambulatory Visit: Payer: Medicaid Other | Attending: Nurse Practitioner

## 2022-08-09 ENCOUNTER — Other Ambulatory Visit: Payer: Self-pay

## 2022-08-09 ENCOUNTER — Encounter: Payer: Self-pay | Admitting: Obstetrics and Gynecology

## 2022-08-09 DIAGNOSIS — R208 Other disturbances of skin sensation: Secondary | ICD-10-CM | POA: Diagnosis present

## 2022-08-09 DIAGNOSIS — M533 Sacrococcygeal disorders, not elsewhere classified: Secondary | ICD-10-CM | POA: Diagnosis present

## 2022-08-09 DIAGNOSIS — M6281 Muscle weakness (generalized): Secondary | ICD-10-CM | POA: Diagnosis present

## 2022-08-09 DIAGNOSIS — R278 Other lack of coordination: Secondary | ICD-10-CM | POA: Insufficient documentation

## 2022-08-09 DIAGNOSIS — N941 Unspecified dyspareunia: Secondary | ICD-10-CM | POA: Insufficient documentation

## 2022-08-09 NOTE — Therapy (Signed)
OUTPATIENT PHYSICAL THERAPY TREATMENT NOTE   Patient Name: Danielle Gillespie MRN: [id number] DOB:1991/08/11, 31 y.o., female Today's Date: 08/09/2022  PCP: Marnee Guarneri, NP  REFERRING PROVIDER: same as above   PT End of Session - 08/09/22 1407     Visit Number 9    Number of Visits 9    Date for PT Re-Evaluation 07/15/22    Authorization Type Medicaid    Authorization Time Period [facility] auth# [id number] 6/21-8/20 10 PT visits and now until 08/22/22    Progress Note Due on Visit 10    PT Start Time 1696    PT Stop Time 1445    PT Time Calculation (min) 40 min    Activity Tolerance Patient tolerated treatment well    Behavior During Therapy [facility] for tasks assessed/performed             Past Medical History:  Diagnosis Date   Anxiety    Depression    Past Surgical History:  Procedure Laterality Date   NO PAST SURGERIES     Patient Active Problem List   Diagnosis Date Noted   Uses birth control 05/02/2022   Hemorrhoid 06/28/2021   Elevated low density lipoprotein (LDL) cholesterol level 04/28/2021   Vitamin D deficiency 04/28/2021   Neutrophilia 04/28/2021   History of postpartum depression 04/27/2021   Obesity 04/27/2021   Vapes non-nicotine containing substance 04/27/2021   Vitamin B12 deficiency 04/23/2021   Marijuana use 05/13/2020   Marnee Guarneri, NP  REFERRING DIAG: vaginal pain  THERAPY DIAG:  Other lack of coordination  Muscle weakness (generalized)  Other disturbances of skin sensation  Sacrococcygeal disorders, not elsewhere classified  Dyspareunia in female  Rationale for Evaluation and Treatment Rehabilitation  PERTINENT HISTORY: hx of trauma to clitoral area and vaginal wall during L and D. yeast infection, hemorrhoids, vit B12 deficiency, hx of postpartum depression (no longer on meds), vit D deficiency, HDL   PRECAUTIONS: none  SUBJECTIVE: Pt stated she had an OBGYN appt and was prescribed a weight loss pill and was told she has  extensive scarring over clitoral area where tearing was. Pt stated period is just ending and she did dilators last week and they are still pushing out. Pt stated OBGYN suggested OTC lidocaine for scarring.   PAIN:  Are you having pain? No 0/10  TODAY'S TREATMENT:     NMR: Access Code: [email or web address] URL: [email or web address] Date: 07/26/2022 Prepared by: Geoffry Paradise  Reviewed exercises (performed breathing and QL stretch). Exercises - Supine Diaphragmatic Breathing  - 1 x daily - 7 x weekly - 2 sets - 5 reps during manual therapy. - Supine Figure 4 Piriformis Stretch  - 2-3 x daily - 7 x weekly - 1 sets - 3 reps - 30 hold - Supine Lower Trunk Rotation  - 1 x daily - 7 x weekly - 1 sets - 3 reps - 30-45 hold - Seated Quadratus Lumborum Stretch  - 1 x daily - 7 x weekly - 1 sets - 3 reps - 30-45 hold - Child's Pose Stretch  - 1 x daily - 7 x weekly - 1 sets - 3 reps - 30-60 hold - Child's Pose Side Bend  - 1 x daily - 7 x weekly - 1 sets - 3 reps - 30-45 hold Cues and demo for proper technique.  Manual therapy: Manual therapy: Internal PFM assessment: pt agreeable to internal vaginal assessment. Pt performed diaphragmatic breathing to improve relaxation of PFM.  Pt reported 1-2/10 pain/pressure over R OI and  levator ani and 2/10 pain/pressure over levator ani. PT performed STM trigger point release to pelvic floor. After manual therapy:pt reported less tension and that "was the best time yet regarding tension/pressure/pain."        PATIENT EDUCATION: Education details: PT reviewed HEP. PT encouraged pt to use pelvic wand with partner to perform trigger point release. PT provided pt with lidocaine samples based on OBGYN recommendation and asked pt to check with NP. PT discussed progress and renewal for 4 add'l weeks. Person educated: Patient Education method: Explanation, Demonstration, Tactile cues, Verbal cues, and Handouts Education comprehension: verbalized  understanding, returned demonstration, and needs further education   HOME EXERCISE PROGRAM: Medbridge: [email or web address]   PT Short Term Goals - 07/12/22 1419       PT SHORT TERM GOAL #1   Title Pt will be IND in HEP to improve pain, strength, balance and flexibility. (ALL STGS TARGET DATE: 3 VISITS)    Baseline no HEP. 05/31/22: IND with HEP.    Status Achieved      PT SHORT TERM GOAL #2   Title Pt will be able to verbalize and demo proper toileting posture, seated posture and scar mobilization to decr. pain and tension in pelvic floor musculature and fully empty bladder and decr. strain during bowel movements.    Baseline Unable to verbalize and demo proper posture. 05/31/22: pt able to demo and verbalize proper toileting and seated posture. Scar mob has not yet been taught.    Status Partially Met      PT SHORT TERM GOAL #3   Title Have pt complete FOTO and write goal as indicated    Baseline not performed 2/2 time constraints. 05/31/22: deferred to next visit.    Status Deferred            All unmet LTGs carried over to new POC: 09/06/22  PT Long Term Goals - 08/09/22 1239       PT LONG TERM GOAL #1   Title Pt will demonstrate improved pelvic alignment, hip strength and posture in order to decr. back pain while caring for her child. (TARGE DATE FOR ALL LTGS: 8 VISITS)    Baseline incr. lumbar spine lordosis, incr. ant. pelvic tilt, 4-/5 gross seated hip abd/add, forward head posture, rounded shoulders. 9/6: back pain incr. To 5/10, 4-/5 in sidelying (B glute med)   Status Partially met     PT LONG TERM GOAL #2   Title Pt will demonstrate proper coordination (relaxation and contraction) of pelvic floor musculature with breathing to decr. vulvar, vaginal and PFM pain while inserting tampon and during intercourse.    Baseline Unable to coordinate with breath and pain with tampon insertion and intercourse-has not had OBGYN recently. 9/6: pt able to coordinate PFM with breath but tampon and  intercourse is still painful but less pain afterwards   Status Partially met      PT LONG TERM GOAL #3   Title Perform internal muscle assessment and write goals as indicated.    Baseline not performed 2/2 time constraints    Status Met      PT LONG TERM GOAL #4   Title Pt will report ability to maintain proper posture and engage TrA musculature at gym for 30+ minutes without incr. Back pain to maintain gains made in PT.    Baseline does not go to gym 2/2 back pain. 9/6: not going to the gym   Status Not met  Clinical impression statement: Today's skilled focused on assessing goals. Pt demonstrated progress as she partially met all LTGs except for gym LTG but plans to renew membership to gym today. Pt also demonstrated less pain and tension during internal muscle assessment and after manual therapy. Pt continues to experience intermittent pelvic and back pain, BLE weakness , decr. Flexibility and postural dysfunction. Therefore, pt would continue to benefit from skilled PT to improve deficits listed above. PT requesting add'l 1x/week for 4 weeks-all LTGs will be carried over to new POC.    Plan: review meditation prn, progress strengthening (core/hips/LE), manual therapy to hips/hamstrings, prn internal muscle manual therapy, progress dilators as tolerated.    Elvera Almario L, PT 08/09/2022, 2:08 PM    Geoffry Paradise, PT,DPT 08/09/22 2:08 PM Phone: [phone] Fax: [phone]

## 2022-08-16 ENCOUNTER — Ambulatory Visit: Payer: Medicaid Other

## 2022-08-16 ENCOUNTER — Other Ambulatory Visit: Payer: Self-pay

## 2022-08-16 DIAGNOSIS — M6281 Muscle weakness (generalized): Secondary | ICD-10-CM

## 2022-08-16 DIAGNOSIS — M533 Sacrococcygeal disorders, not elsewhere classified: Secondary | ICD-10-CM

## 2022-08-16 DIAGNOSIS — R278 Other lack of coordination: Secondary | ICD-10-CM | POA: Diagnosis not present

## 2022-08-16 DIAGNOSIS — R208 Other disturbances of skin sensation: Secondary | ICD-10-CM

## 2022-08-16 DIAGNOSIS — N941 Unspecified dyspareunia: Secondary | ICD-10-CM

## 2022-08-16 NOTE — Patient Instructions (Signed)
BARRE: -Modify and pause workout as needed.  -Perform dead bug in place of crunches, barre V, or barre crunch as needed. -Perform barre curl to midway down to floor and back to starting position OR you can stay midway and bring arms out to the side and back or hold arms in front of you. -Take breaks as needed!! HYDRATE!

## 2022-08-16 NOTE — Therapy (Signed)
OUTPATIENT PHYSICAL THERAPY TREATMENT NOTE/PROGRESS NOTE   Patient Name: Danielle Gillespie MRN: [id number] DOB:26-Jun-1991, 31 y.o., female Today's Date: 08/16/2022  PCP: Marnee Guarneri, NP  REFERRING PROVIDER: same as above   PT End of Session - 08/16/22 1148     Visit Number 10    Number of Visits 12    Date for PT Re-Evaluation --   original POC: 07/15/22   Authorization Type Medicaid    Authorization Time Period [facility] auth# [id number] 6/21-8/20 12 PT visits and now until 09/06/22    Progress Note Due on Visit 10    PT Start Time 1146    PT Stop Time 1230    PT Time Calculation (min) 44 min    Activity Tolerance Patient tolerated treatment well    Behavior During Therapy [facility] for tasks assessed/performed             Past Medical History:  Diagnosis Date   Anxiety    Depression    Past Surgical History:  Procedure Laterality Date   NO PAST SURGERIES     Patient Active Problem List   Diagnosis Date Noted   Uses birth control 05/02/2022   Hemorrhoid 06/28/2021   Elevated low density lipoprotein (LDL) cholesterol level 04/28/2021   Vitamin D deficiency 04/28/2021   Neutrophilia 04/28/2021   History of postpartum depression 04/27/2021   Obesity 04/27/2021   Vapes non-nicotine containing substance 04/27/2021   Vitamin B12 deficiency 04/23/2021   Marijuana use 05/13/2020   Marnee Guarneri, NP  REFERRING DIAG: vaginal pain  THERAPY DIAG:  Other lack of coordination  Muscle weakness (generalized)  Other disturbances of skin sensation  Sacrococcygeal disorders, not elsewhere classified  Dyspareunia in female  Rationale for Evaluation and Treatment Rehabilitation  PERTINENT HISTORY: hx of trauma to clitoral area and vaginal wall during L and D. yeast infection, hemorrhoids, vit B12 deficiency, hx of postpartum depression (no longer on meds), vit D deficiency, HDL   PRECAUTIONS: none  SUBJECTIVE: Pt stated she had intercourse twice without pain during or  afterwards. Pt still pushing out dilators and having pain with tampons.    PAIN:  Are you having pain? No 0/10  TODAY'S TREATMENT:  NMR: Gait: a little tension/pressure in PFM muscles during gait for 10 minutes over even/uneven terrain in/outdoors with cues to decr. With breathing and shoulder rolls/stretches along with lower trap Ys at wall 2x10 reps.  Barre: 10 minutes  with cues and demo for technique. Intermittent hand on chair for balance. Performed with S for safety. Performed in standing and hookling: Hip ext, hip abd, hip IR/ER with knee ext, second position squats and holds, barre curl, curl with oblique activation Bilat, B add stretch in sitting, childs pose with breathing afterwards to decr. PFM tension.  Dead bud in supine to modify during any crunches in barre. No pain reported. Rest breaks taken 2/2 fatigue and to ensure proper technique.          Self care: PATIENT EDUCATION: Education details: PT educated pt on taking breaks during exercise to ensure PFM tension does not incr. Demo'd proper barre activities to incr. LE/hip/core strength to reduce PFM tension. PT educated pt on wearing tennis shoes vs. Slides to decr. Foot tension which incr. LE/PFM tension. PT discussed POC and new medicaid auth. PT discussed pt attempting to wake up 30 minutes prior to dtr waking 1-2 days/week in order to perform barre/HEP to decr. PFM tension. Person educated: Patient Education method: Explanation, Demonstration, Tactile cues, Verbal cues, and Handouts  Education comprehension: verbalized understanding, returned demonstration, and needs further education   HOME EXERCISE PROGRAM: Medbridge: [email or web address]   PT Short Term Goals - 07/12/22 1419       PT SHORT TERM GOAL #1   Title Pt will be IND in HEP to improve pain, strength, balance and flexibility. (ALL STGS TARGET DATE: 3 VISITS)    Baseline no HEP. 05/31/22: IND with HEP.    Status Achieved      PT SHORT TERM GOAL #2   Title Pt  will be able to verbalize and demo proper toileting posture, seated posture and scar mobilization to decr. pain and tension in pelvic floor musculature and fully empty bladder and decr. strain during bowel movements.    Baseline Unable to verbalize and demo proper posture. 05/31/22: pt able to demo and verbalize proper toileting and seated posture. Scar mob has not yet been taught.    Status Partially Met      PT SHORT TERM GOAL #3   Title Have pt complete FOTO and write goal as indicated    Baseline not performed 2/2 time constraints. 05/31/22: deferred to next visit.    Status Deferred            All unmet LTGs carried over to new POC: 09/06/22  PT Long Term Goals - 08/09/22 1239       PT LONG TERM GOAL #1   Title Pt will demonstrate improved pelvic alignment, hip strength and posture in order to decr. back pain while caring for her child. (TARGE DATE FOR ALL LTGS: 8 VISITS)    Baseline incr. lumbar spine lordosis, incr. ant. pelvic tilt, 4-/5 gross seated hip abd/add, forward head posture, rounded shoulders. 9/6: back pain incr. To 5/10, 4-/5 in sidelying (B glute med)   Status On-going     PT LONG TERM GOAL #2   Title Pt will demonstrate proper coordination (relaxation and contraction) of pelvic floor musculature with breathing to decr. vulvar, vaginal and PFM pain while inserting tampon and during intercourse.    Baseline Unable to coordinate with breath and pain with tampon insertion and intercourse-has not had OBGYN recently. 9/6: pt able to coordinate PFM with breath but tampon and intercourse is still painful but less pain afterwards   Status On-going     PT LONG TERM GOAL #3   Title Perform internal muscle assessment and write goals as indicated.    Baseline not performed 2/2 time constraints    Status Met      PT LONG TERM GOAL #4   Title Pt will report ability to maintain proper posture and engage TrA musculature at gym for 30+ minutes without incr. Back pain to maintain  gains made in PT.    Baseline does not go to gym 2/2 back pain. 9/6: not going to the gym   Status On-going           Clinical impression statement: Today's skilled session focused on improving strength and endurance while maintaining proper body mechanics/posture to decr. PFM tension during workouts. Pt noted to experience rounded shoulders and incr. PFM tension during gait and barre workout which decr. With rest and stretches.  Pt would continue to benefit from skilled PT to improve pain during all ADLs in order to care for dtr.   Plan: progress strengthening (core/hips/LE), manual therapy to hips/hamstrings prn internal muscle manual therapy prn.   Kenzlei Runions L, PT 08/16/2022, 11:51 AM  Progress Note Reporting Period 05/16/22 to 08/16/22  See note below for  Objective Data and Assessment of Progress/Goals.       Geoffry Paradise, PT,DPT 08/16/22 11:51 AM Phone: [phone] Fax: [phone]

## 2022-08-23 ENCOUNTER — Other Ambulatory Visit: Payer: Self-pay

## 2022-08-23 ENCOUNTER — Ambulatory Visit: Payer: Medicaid Other

## 2022-08-23 DIAGNOSIS — M6281 Muscle weakness (generalized): Secondary | ICD-10-CM

## 2022-08-23 DIAGNOSIS — R208 Other disturbances of skin sensation: Secondary | ICD-10-CM

## 2022-08-23 DIAGNOSIS — R278 Other lack of coordination: Secondary | ICD-10-CM

## 2022-08-23 DIAGNOSIS — N941 Unspecified dyspareunia: Secondary | ICD-10-CM

## 2022-08-23 DIAGNOSIS — M533 Sacrococcygeal disorders, not elsewhere classified: Secondary | ICD-10-CM

## 2022-08-23 NOTE — Therapy (Signed)
OUTPATIENT PHYSICAL THERAPY TREATMENT NOTE/PROGRESS NOTE   Patient Name: Danielle Gillespie MRN: [id number] DOB:1991/05/31, 31 y.o., female Today's Date: 08/23/2022  PCP: Marnee Guarneri, NP  REFERRING PROVIDER: same as above   PT End of Session - 08/23/22 1405     Visit Number 11    Number of Visits 12    Date for PT Re-Evaluation 09/06/22    Authorization Type Medicaid    Authorization Time Period [facility] auth# [id number] 6/21-8/20 12 PT visits and now until 09/06/22    Progress Note Due on Visit 10    PT Start Time 1404    PT Stop Time 1445    PT Time Calculation (min) 41 min    Activity Tolerance Patient tolerated treatment well    Behavior During Therapy [facility] for tasks assessed/performed             Past Medical History:  Diagnosis Date   Anxiety    Depression    Past Surgical History:  Procedure Laterality Date   NO PAST SURGERIES     Patient Active Problem List   Diagnosis Date Noted   Uses birth control 05/02/2022   Hemorrhoid 06/28/2021   Elevated low density lipoprotein (LDL) cholesterol level 04/28/2021   Vitamin D deficiency 04/28/2021   Neutrophilia 04/28/2021   History of postpartum depression 04/27/2021   Obesity 04/27/2021   Vapes non-nicotine containing substance 04/27/2021   Vitamin B12 deficiency 04/23/2021   Marijuana use 05/13/2020   Marnee Guarneri, NP  REFERRING DIAG: vaginal pain  THERAPY DIAG:  Other lack of coordination  Muscle weakness (generalized)  Other disturbances of skin sensation  Sacrococcygeal disorders, not elsewhere classified  Dyspareunia in female  Rationale for Evaluation and Treatment Rehabilitation  PERTINENT HISTORY: hx of trauma to clitoral area and vaginal wall during L and D. yeast infection, hemorrhoids, vit B12 deficiency, hx of postpartum depression (no longer on meds), vit D deficiency, HDL   PRECAUTIONS: none  SUBJECTIVE: Pt reported she's been feeling good overall. She had intercourse and did not  have pain during or after but is a little tenderness today. Pt was able to perform barre activities and walking (more so walking) and LE activities. Pt reported blurred vision upon arrival, she incr. Her weight loss pill dosage today and her cheeks are also red.    PAIN:  Are you having pain? No pain 0/10  VITALS: BP: 113/73, pulse: 82bpm  TODAY'S TREATMENT:  NMR: Access Code: [email or web address] URL: [email or web address] Date: 08/23/2022 Prepared by: Geoffry Paradise  Exercises - Plank on Knees  - 1 x daily - 3 x weekly - 1 sets - 3 reps - 30 hold - Side Plank on Knees  - 1 x daily - 3 x weekly - 1 sets - 3 reps - 20 hold - Modified Thomas Stretch  - 1 x daily - 7 x weekly - 1 sets - 3 reps - 30-45 hold - Half Kneeling Hip Flexor Stretch  - 1 x daily - 7 x weekly - 1 sets - 3 reps - 30 hold Cues and demo for proper technique. S for safety. No pain reported. Rest breaks taken 2/2 fatigue and to ensure proper technique.   MANUAL THERAPY: STM to B hip flexors 2/2 incr. Tension and pain which decr. After manual therapy and stretching-added to HEP.        Self care: PATIENT EDUCATION: Education details: PT added progressively more core challenging activities to HEP. PT discussed the importance of performing a warmup and cool down stretches  before/after strength training as pt reported soreness after performing total body workout. PT discussed how to modify exercises needed. PT discussed assessing goals next week and determining POC. PT discussed the importance of noting any side effects (such as flushing and blurred vision) as she incr. Medication per MD's instructions.  Person educated: Patient Education method: Explanation, Demonstration, Tactile cues, Verbal cues, and Handouts Education comprehension: verbalized understanding, returned demonstration, and needs further education   HOME EXERCISE PROGRAM: Medbridge: [email or web address]   PT Short Term Goals - 07/12/22 1419       PT  SHORT TERM GOAL #1   Title Pt will be IND in HEP to improve pain, strength, balance and flexibility. (ALL STGS TARGET DATE: 3 VISITS)    Baseline no HEP. 05/31/22: IND with HEP.    Status Achieved      PT SHORT TERM GOAL #2   Title Pt will be able to verbalize and demo proper toileting posture, seated posture and scar mobilization to decr. pain and tension in pelvic floor musculature and fully empty bladder and decr. strain during bowel movements.    Baseline Unable to verbalize and demo proper posture. 05/31/22: pt able to demo and verbalize proper toileting and seated posture. Scar mob has not yet been taught.    Status Partially Met      PT SHORT TERM GOAL #3   Title Have pt complete FOTO and write goal as indicated    Baseline not performed 2/2 time constraints. 05/31/22: deferred to next visit.    Status Deferred            All unmet LTGs carried over to new POC: 09/06/22  PT Long Term Goals - 08/09/22 1239       PT LONG TERM GOAL #1   Title Pt will demonstrate improved pelvic alignment, hip strength and posture in order to decr. back pain while caring for her child. (TARGE DATE FOR ALL LTGS: 8 VISITS)    Baseline incr. lumbar spine lordosis, incr. ant. pelvic tilt, 4-/5 gross seated hip abd/add, forward head posture, rounded shoulders. 9/6: back pain incr. To 5/10, 4-/5 in sidelying (B glute med)   Status On-going     PT LONG TERM GOAL #2   Title Pt will demonstrate proper coordination (relaxation and contraction) of pelvic floor musculature with breathing to decr. vulvar, vaginal and PFM pain while inserting tampon and during intercourse.    Baseline Unable to coordinate with breath and pain with tampon insertion and intercourse-has not had OBGYN recently. 9/6: pt able to coordinate PFM with breath but tampon and intercourse is still painful but less pain afterwards   Status On-going     PT LONG TERM GOAL #3   Title Perform internal muscle assessment and write goals as indicated.     Baseline not performed 2/2 time constraints    Status Met      PT LONG TERM GOAL #4   Title Pt will report ability to maintain proper posture and engage TrA musculature at gym for 30+ minutes without incr. Back pain to maintain gains made in PT.    Baseline does not go to gym 2/2 back pain. 9/6: not going to the gym   Status On-going           Clinical impression statement: Today's skilled session focused on progressing core strengthening activities to decr. PFM tension and improve surrounding musculature strength. Pt demonstrated improved strength as she tolerated progression of core workouts. Pt continues to experience incr. Tension  in hips (flexors) which can contribute to postural issues and incr. PFM tension. Therefore, pt would continue to benefit from skilled PT to improve pain during all ADLs in order to care for dtr.   Plan: assess LTGs and renew as indicated.  Artis Buechele L, PT 08/23/2022, 2:08 PM   Geoffry Paradise, PT,DPT 08/23/22 2:08 PM Phone: [phone] Fax: [phone]

## 2022-08-30 ENCOUNTER — Ambulatory Visit: Payer: Medicaid Other

## 2022-09-04 NOTE — Progress Notes (Signed)
    GYNECOLOGY PROGRESS NOTE  Subjective:    Patient ID: Danielle Gillespie, female    DOB: Aug 24, 1991, 31 y.o.   MRN: [id number]  HPI  Patient is a 31 y.o. female who presents for 1 month weight management follow up. She has a past history of obesity, Elevated low density lipoprotein (LDL) cholesterol level. She initiated use of Phentermine and B-12 1 months ago.  Denies any undesirable side effects and reports compliance with medications.    Current interventions:  1. Diet - Trying to eat more healthy.  Protein shake for breakfast. Smoothie or salad for lunch. Protein shake for dinner sometimes. 2. Activity - She has had much more energy with getting things done around the house, for the first few days.  Is trying to walk more, but recently started only due to her and her whole family being ill for the past 1.5-2 weeks.  3. Reports bowel movements are abnormal, she has been constipated, bowel movements are hard. This has been ongoing for a week.    The following portions of the patient's history were reviewed and updated as appropriate: allergies, current medications, past family history, past medical history, past social history, past surgical history, and problem list.  Review of Systems Pertinent items noted in HPI and remainder of comprehensive ROS otherwise negative.   Objective:       08/08/2022   10:40 AM 05/30/2022    4:00 PM 05/02/2022    2:09 PM  Vitals with BMI  Height 5\' 4"  5\' 4"  5\' 4"   Weight 181 lbs 14 oz 182 lbs 10 oz 177 lbs 13 oz  BMI 31.21 74.16 38.4  Systolic 536 468 94  Diastolic 75 75 61  Pulse 85 88 84    General appearance: alert, cooperative, and no distress Abdomen: soft, non-tender.  Waist circumference 38 in.    Labs:  No new labs Assessment:   Weight management Obesity, Body mass index is 31.74 kg/m.  Plan:   Weight management  - mild weight loss noted.  Has been on Phentermine 337.5 mg for ~ 2 weeks (titrated up from 30 mg)  Discussed goal of  weight loss of at least 5-7% by the end of 3 months. Advised to decrease 1 of her protein shake meal replacements.  Refill on medication given. Did not give B12 injection this month as she did not feel it helped much.  Advised on OTC regimens or constipation.  RTC in 2 months.     Rubie Maid, MD [facility]

## 2022-09-05 ENCOUNTER — Ambulatory Visit (INDEPENDENT_AMBULATORY_CARE_PROVIDER_SITE_OTHER): Payer: Medicaid Other | Admitting: Obstetrics and Gynecology

## 2022-09-05 ENCOUNTER — Encounter: Payer: Self-pay | Admitting: Obstetrics and Gynecology

## 2022-09-05 VITALS — BP 104/77 | HR 98 | Resp 16 | Ht 63.0 in | Wt 179.2 lb

## 2022-09-05 DIAGNOSIS — Z6831 Body mass index (BMI) 31.0-31.9, adult: Secondary | ICD-10-CM

## 2022-09-05 DIAGNOSIS — E669 Obesity, unspecified: Secondary | ICD-10-CM

## 2022-09-05 DIAGNOSIS — Z7689 Persons encountering health services in other specified circumstances: Secondary | ICD-10-CM | POA: Diagnosis not present

## 2022-09-05 MED ORDER — PHENTERMINE HCL 37.5 MG PO CAPS
37.5000 mg | ORAL_CAPSULE | ORAL | 1 refills | Status: DC
Start: 2022-09-05 — End: 2022-11-07

## 2022-09-06 ENCOUNTER — Ambulatory Visit: Payer: Medicaid Other

## 2022-09-26 ENCOUNTER — Telehealth: Payer: Medicaid Other | Admitting: Nurse Practitioner

## 2022-09-26 DIAGNOSIS — B3731 Acute candidiasis of vulva and vagina: Secondary | ICD-10-CM | POA: Diagnosis not present

## 2022-09-26 MED ORDER — FLUCONAZOLE 150 MG PO TABS: 150.0000 mg | ORAL_TABLET | Freq: Once | ORAL | 0 refills | Status: DC

## 2022-09-26 MED ORDER — FLUCONAZOLE 150 MG PO TABS: ORAL_TABLET | ORAL | 0 refills | Status: DC

## 2022-09-26 NOTE — Addendum Note (Signed)
Addended by: Chevis Pretty on: 09/26/2022 07:00 PM   Modules accepted: Orders

## 2022-09-26 NOTE — Progress Notes (Signed)

## 2022-10-01 NOTE — Patient Instructions (Signed)
Otitis Media, Adult  Otitis media is a condition in which the middle ear is red and swollen (inflamed) and full of fluid. The middle ear is the part of the ear that contains bones for hearing as well as air that helps send sounds to the brain. The condition usually goes away on its own. What are the causes? This condition is caused by a blockage in the eustachian tube. This tube connects the middle ear to the back of the nose. It normally allows air into the middle ear. The blockage is caused by fluid or swelling. Problems that can cause blockage include: A cold or infection that affects the nose, mouth, or throat. Allergies. An irritant, such as tobacco smoke. Adenoids that have become large. The adenoids are soft tissue located in the back of the throat, behind the nose and the roof of the mouth. Growth or swelling in the upper part of the throat, just behind the nose (nasopharynx). Damage to the ear caused by a change in pressure. This is called barotrauma. What increases the risk? You are more likely to develop this condition if you: Smoke or are exposed to tobacco smoke. Have an opening in the roof of your mouth (cleft palate). Have acid reflux. Have problems in your body's defense system (immune system). What are the signs or symptoms? Symptoms of this condition include: Ear pain. Fever. Problems with hearing. Being tired. Fluid leaking from the ear. Ringing in the ear. How is this treated? This condition can go away on its own within 3-5 days. But if the condition is caused by germs (bacteria) and does not go away on its own, or if it keeps coming back, your doctor may: Give you antibiotic medicines. Give you medicines for pain. Follow these instructions at home: Take over-the-counter and prescription medicines only as told by your doctor. If you were prescribed an antibiotic medicine, take it as told by your doctor. Do not stop taking it even if you start to feel better. Keep  all follow-up visits. Contact a doctor if: You have bleeding from your nose. There is a lump on your neck. You are not feeling better in 5 days. You feel worse instead of better. Get help right away if: You have pain that is not helped with medicine. You have swelling, redness, or pain around your ear. You get a stiff neck. You cannot move part of your face (paralysis). You notice that the bone behind your ear hurts when you touch it. You get a very bad headache. Summary Otitis media means that the middle ear is red, swollen, and full of fluid. This condition usually goes away on its own. If the problem does not go away, treatment may be needed. You may be given medicines to treat the infection or to treat your pain. If you were prescribed an antibiotic medicine, take it as told by your doctor. Do not stop taking it even if you start to feel better. Keep all follow-up visits. This information is not intended to replace advice given to you by your health care provider. Make sure you discuss any questions you have with your health care provider. Document Revised: 02/28/2021 Document Reviewed: 02/28/2021 Elsevier Patient Education  2023 Elsevier Inc.  

## 2022-10-04 ENCOUNTER — Encounter: Payer: Self-pay | Admitting: Nurse Practitioner

## 2022-10-04 ENCOUNTER — Ambulatory Visit: Payer: Medicaid Other | Admitting: Nurse Practitioner

## 2022-10-04 ENCOUNTER — Ambulatory Visit (INDEPENDENT_AMBULATORY_CARE_PROVIDER_SITE_OTHER): Payer: Medicaid Other | Admitting: Nurse Practitioner

## 2022-10-04 VITALS — BP 111/72 | HR 97 | Temp 98.0°F | Wt 177.9 lb

## 2022-10-04 DIAGNOSIS — N898 Other specified noninflammatory disorders of vagina: Secondary | ICD-10-CM | POA: Diagnosis not present

## 2022-10-04 DIAGNOSIS — H9203 Otalgia, bilateral: Secondary | ICD-10-CM | POA: Diagnosis not present

## 2022-10-04 LAB — URINALYSIS, ROUTINE W REFLEX MICROSCOPIC
Bilirubin, UA: NEGATIVE
Glucose, UA: NEGATIVE
Leukocytes,UA: NEGATIVE
Nitrite, UA: NEGATIVE
Protein,UA: NEGATIVE
RBC, UA: NEGATIVE
Specific Gravity, UA: 1.02 (ref 1.005–1.030)
Urobilinogen, Ur: 1 mg/dL (ref 0.2–1.0)
pH, UA: 7 (ref 5.0–7.5)

## 2022-10-04 LAB — WET PREP FOR TRICH, YEAST, CLUE
Clue Cell Exam: NEGATIVE
Trichomonas Exam: NEGATIVE
Yeast Exam: NEGATIVE

## 2022-10-04 MED ORDER — MICONAZOLE 3 4 % VA CREA: 1.0000 | TOPICAL_CREAM | Freq: Two times a day (BID) | VAGINAL | 0 refills | Status: DC

## 2022-10-04 MED ORDER — PREDNISONE 20 MG PO TABS: 40.0000 mg | ORAL_TABLET | Freq: Every day | ORAL | 0 refills | Status: AC

## 2022-10-04 NOTE — Progress Notes (Signed)
Contacted via MyChart   Good afternoon, overall urine appears stable with exception of some ketones, which means you need a little more hydration and water intake:)

## 2022-10-04 NOTE — Assessment & Plan Note (Signed)
Acute and ongoing since antibiotic use with no improvement with Diflucan.  Wet prep negative, will obtain urinalysis and send for culture if needed.  Send in Miconazole cream to place externally as suspect some irritation remains.

## 2022-10-04 NOTE — Assessment & Plan Note (Signed)
Acute -- ear infection improving overall, but eustachian tube dysfunction present bilaterally.  Will treat with Prednisone 40 MG daily for 5 days.  Recommend she take Claritin 10 MG daily for 7 to 10 days.  Educated her on findings and cause.  Return if ongoing or worsening.

## 2022-10-04 NOTE — Progress Notes (Signed)
BP 111/72   Pulse 97   Temp 98 F (36.7 C) (Oral)   Wt 177 lb 14.4 oz (80.7 kg)   LMP 08/29/2022 (Exact Date)   SpO2 97%   BMI 31.51 kg/m    Subjective:    Patient ID: Danielle Gillespie, female    DOB: [birth year], 31 y.o.   MRN: [id number]  HPI: Danielle Gillespie is a 31 y.o. female  Chief Complaint  Patient presents with   Ear Pain    Patient says Saturday before last she has to go to Urgent Care and was diagnosed with an ear infection. Patient says she has since completed her course of treatment. Patient says she still can not hear out of her L ear.   Vaginitis     Patient says she has had a virtual visit for a possibly yeast infection and was prescribed Diflucan. Patient says now has burning and discharge. Patient says when she uses the Diflucan cream she still notices burning.    EAR PAIN Was treated at urgent care on 09/23/22 -- Augmentin used.  Reports still feels ear fullness with occasional pain + sharp feeling in right ear occasionally. Duration: days Involved ear(s): bilateral Fever: no Otorrhea: no Upper respiratory infection symptoms: yes had URI prior to this, daughter had rhinovirus two weeks before Pruritus: no Hearing loss: yes muffled sound Water immersion no Using Q-tips: no Recurrent otitis media: no Status: fluctuating Treatments attempted:  Augmentin and Tessalon    VAGINAL DISCHARGE Vaginal discharge started on 09/25/22, treated with Diflucan but still having irritation to vaginal area.   Duration: days Discharge description: yellow  Pruritus: yes Dysuria: no Malodorous: no Urinary frequency: no Fevers: no Abdominal pain: no  Sexual activity: practicing safe sex History of sexually transmitted diseases: no Recent antibiotic use: yes Context:stable  Treatments attempted: antifungal   Relevant past medical, surgical, family and social history reviewed and updated as indicated. Interim medical history since our last visit reviewed. Allergies and  medications reviewed and updated.  Review of Systems  Constitutional:  Negative for activity change, appetite change, diaphoresis, fatigue and fever.  HENT:  Negative for ear discharge and ear pain.   Respiratory:  Negative for cough, chest tightness, shortness of breath and wheezing.   Cardiovascular:  Negative for chest pain, palpitations and leg swelling.  Gastrointestinal: Negative.   Genitourinary:  Positive for vaginal discharge. Negative for decreased urine volume, dysuria, frequency, pelvic pain, vaginal bleeding and vaginal pain.  Neurological: Negative.   Psychiatric/Behavioral: Negative.     Per HPI unless specifically indicated above     Objective:    BP 111/72   Pulse 97   Temp 98 F (36.7 C) (Oral)   Wt 177 lb 14.4 oz (80.7 kg)   LMP 08/29/2022 (Exact Date)   SpO2 97%   BMI 31.51 kg/m   Wt Readings from Last 3 Encounters:  10/04/22 177 lb 14.4 oz (80.7 kg)  09/05/22 179 lb 3.2 oz (81.3 kg)  08/08/22 181 lb 14.4 oz (82.5 kg)    Physical Exam Vitals and nursing note reviewed.  Constitutional:      General: She is awake. She is not in acute distress.    Appearance: She is well-developed and well-groomed. She is obese. She is not ill-appearing or toxic-appearing.  HENT:     Head: Normocephalic.     Right Ear: Hearing, ear canal and external ear normal. A middle ear effusion is present. There is no impacted cerumen. Tympanic membrane is not injected.  Left Ear: Hearing, ear canal and external ear normal. A middle ear effusion is present. There is no impacted cerumen. Tympanic membrane is not injected.     Nose: Nose normal. No rhinorrhea.     Right Sinus: No maxillary sinus tenderness or frontal sinus tenderness.     Left Sinus: No maxillary sinus tenderness or frontal sinus tenderness.     Mouth/Throat:     Mouth: Mucous membranes are moist.     Pharynx: Posterior oropharyngeal erythema (mild with cobblestoning) present. No pharyngeal swelling or  oropharyngeal exudate.  Eyes:     General: Lids are normal.        Right eye: No discharge.        Left eye: No discharge.     Conjunctiva/sclera: Conjunctivae normal.     Pupils: Pupils are equal, round, and reactive to light.  Neck:     Thyroid: No thyromegaly.     Vascular: No carotid bruit.  Cardiovascular:     Rate and Rhythm: Normal rate and regular rhythm.     Heart sounds: Normal heart sounds. No murmur heard.    No gallop.  Pulmonary:     Effort: Pulmonary effort is normal. No accessory muscle usage or respiratory distress.     Breath sounds: Normal breath sounds.  Abdominal:     General: Bowel sounds are normal. There is no distension.     Palpations: Abdomen is soft.     Tenderness: There is no abdominal tenderness.  Musculoskeletal:     Cervical back: Normal range of motion and neck supple.     Right lower leg: No edema.     Left lower leg: No edema.  Skin:    General: Skin is warm and dry.  Neurological:     Mental Status: She is alert and oriented to person, place, and time.  Psychiatric:        Attention and Perception: Attention normal.        Mood and Affect: Mood normal.        Speech: Speech normal.        Behavior: Behavior normal. Behavior is cooperative.        Thought Content: Thought content normal.    Results for orders placed or performed in visit on 05/02/22  CBC with Differential/Platelet  Result Value Ref Range   WBC 11.0 (H) 3.4 - 10.8 x10E3/uL   RBC 4.42 3.77 - 5.28 x10E6/uL   Hemoglobin 13.9 11.1 - 15.9 g/dL   Hematocrit 40.7 34.0 - 46.6 %   MCV 92 79 - 97 fL   MCH 31.4 26.6 - 33.0 pg   MCHC 34.2 31.5 - 35.7 g/dL   RDW 12.4 11.7 - 15.4 %   Platelets 398 150 - 450 x10E3/uL   Neutrophils 67 Not Estab. %   Lymphs 26 Not Estab. %   Monocytes 5 Not Estab. %   Eos 1 Not Estab. %   Basos 1 Not Estab. %   Neutrophils Absolute 7.4 (H) 1.4 - 7.0 x10E3/uL   Lymphocytes Absolute 2.8 0.7 - 3.1 x10E3/uL   Monocytes Absolute 0.6 0.1 - 0.9  x10E3/uL   EOS (ABSOLUTE) 0.1 0.0 - 0.4 x10E3/uL   Basophils Absolute 0.1 0.0 - 0.2 x10E3/uL   Immature Granulocytes 0 Not Estab. %   Immature Grans (Abs) 0.0 0.0 - 0.1 x10E3/uL  Comprehensive metabolic panel  Result Value Ref Range   Glucose 97 70 - 99 mg/dL   BUN 12 6 - 20 mg/dL  Creatinine, Ser 0.82 0.57 - 1.00 mg/dL   eGFR 98 >59 mL/min/1.73   BUN/Creatinine Ratio 15 9 - 23   Sodium 138 134 - 144 mmol/L   Potassium 4.5 3.5 - 5.2 mmol/L   Chloride 103 96 - 106 mmol/L   CO2 20 20 - 29 mmol/L   Calcium 9.5 8.7 - 10.2 mg/dL   Total Protein 7.2 6.0 - 8.5 g/dL   Albumin 4.2 3.8 - 4.8 g/dL   Globulin, Total 3.0 1.5 - 4.5 g/dL   Albumin/Globulin Ratio 1.4 1.2 - 2.2   Bilirubin Total <0.2 0.0 - 1.2 mg/dL   Alkaline Phosphatase 76 44 - 121 IU/L   AST 10 0 - 40 IU/L   ALT 14 0 - 32 IU/L  Lipid Panel w/o Chol/HDL Ratio  Result Value Ref Range   Cholesterol, Total 183 100 - 199 mg/dL   Triglycerides 117 0 - 149 mg/dL   HDL 45 >39 mg/dL   VLDL Cholesterol Cal 21 5 - 40 mg/dL   LDL Chol Calc (NIH) 117 (H) 0 - 99 mg/dL  TSH  Result Value Ref Range   TSH 1.460 0.450 - 4.500 uIU/mL  VITAMIN D 25 Hydroxy (Vit-D Deficiency, Fractures)  Result Value Ref Range   Vit D, 25-Hydroxy 44.0 30.0 - 100.0 ng/mL  Vitamin B12  Result Value Ref Range   Vitamin B-12 278 232 - 1,245 pg/mL  Pregnancy, urine  Result Value Ref Range   Preg Test, Ur Negative Negative      Assessment & Plan:   Problem List Items Addressed This Visit       Other   Otalgia of both ears - Primary    Acute -- ear infection improving overall, but eustachian tube dysfunction present bilaterally.  Will treat with Prednisone 40 MG daily for 5 days.  Recommend she take Claritin 10 MG daily for 7 to 10 days.  Educated her on findings and cause.  Return if ongoing or worsening.      Vaginal irritation    Acute and ongoing since antibiotic use with no improvement with Diflucan.  Wet prep negative, will obtain urinalysis  and send for culture if needed.  Send in Miconazole cream to place externally as suspect some irritation remains.      Relevant Orders   WET PREP FOR TRICH, YEAST, CLUE   Urinalysis, Routine w reflex microscopic     Follow up plan: Return if symptoms worsen or fail to improve.

## 2022-10-09 ENCOUNTER — Encounter: Payer: Self-pay | Admitting: Obstetrics and Gynecology

## 2022-10-24 ENCOUNTER — Other Ambulatory Visit: Payer: Self-pay | Admitting: Nurse Practitioner

## 2022-10-25 MED ORDER — MICONAZOLE 3 4 % VA CREA: 1.0000 | TOPICAL_CREAM | Freq: Two times a day (BID) | VAGINAL | 0 refills | Status: DC

## 2022-10-25 NOTE — Telephone Encounter (Signed)
Medication refill for Miconazole nitrate vaginal 4% cream last ov 10/04/22, upcoming ov 05/04/23 . Please advise

## 2022-11-06 NOTE — Progress Notes (Signed)
    GYNECOLOGY PROGRESS NOTE  Subjective:    Patient ID: Danielle Gillespie, female    DOB: 06-07-1991, 31 y.o.   MRN: [id number]  HPI  Patient is a 31 y.o. female who presents for weight management follow up. She has a past history of obesity, LDL. She initiated use of Phentermine and B12, ~ 3 months ago.  Denies any undesirable side effects and reports compliance with medications.  She would like to start back getting B-12 injections. She thought that the injections wasn't helping her, but since being without it she realizes that it did give her some energy.  She doesn't think the Phentermine is helping decrease her appetite.    Current interventions:  1. Diet - She has decrease the amount of protein shakes. Drinks protein shakes in the morning only. Increased fruit and nut intake. 2. Activity - Increased walking to 20-30 minutes per day. Strength training at home. 3. Reports bowel movements are sometime she has firm stool and other times stool is harder. She is having bowel movements once daily. She is taking fiber capsules.    The following portions of the patient's history were reviewed and updated as appropriate: allergies, current medications, past family history, past medical history, past social history, past surgical history, and problem list.  Review of Systems Genitourinary:positive for vaginal discharge, has history of yeast infections, recurrent.    Objective:       11/07/2022   10:48 AM 10/04/2022    8:06 AM 09/05/2022   10:54 AM  Vitals with BMI  Height 5\' 2"   5\' 3"   Weight 176 lbs 177 lbs 14 oz 179 lbs 3 oz  BMI 32.18  31.75  Systolic  111 104  Diastolic  72 77  Pulse  97 98    General appearance: alert, cooperative, and no distress Abdomen: soft, non-tender.  Waist circumference 35.25 in.    Labs:  No new labs  Assessment:   Weight management Obesity, Body mass index is 32.19 kg/m.  Plan:   Weight management  - patient with only 1 lb weight loss since last  visit despite increasing efforts for diet and exercise, will attempt use for 1 additional month, will also add Topamax for increased appetite suppression and cravings (notes late night cravings in the middle of the night).  Also will resume B12 injection today.  Advised that if no significant weight loss occurs  by next visit, may need to change therapy.     , MD [facility]

## 2022-11-07 ENCOUNTER — Ambulatory Visit (INDEPENDENT_AMBULATORY_CARE_PROVIDER_SITE_OTHER): Payer: Medicaid Other | Admitting: Obstetrics and Gynecology

## 2022-11-07 ENCOUNTER — Encounter: Payer: Self-pay | Admitting: Obstetrics and Gynecology

## 2022-11-07 VITALS — Resp 16 | Ht 62.0 in | Wt 176.0 lb

## 2022-11-07 DIAGNOSIS — E669 Obesity, unspecified: Secondary | ICD-10-CM | POA: Diagnosis not present

## 2022-11-07 DIAGNOSIS — Z6832 Body mass index (BMI) 32.0-32.9, adult: Secondary | ICD-10-CM

## 2022-11-07 DIAGNOSIS — Z7689 Persons encountering health services in other specified circumstances: Secondary | ICD-10-CM

## 2022-11-07 DIAGNOSIS — Z713 Dietary counseling and surveillance: Secondary | ICD-10-CM

## 2022-11-07 MED ORDER — FLUCONAZOLE 150 MG PO TABS: 150.0000 mg | ORAL_TABLET | Freq: Once | ORAL | 3 refills | Status: AC

## 2022-11-07 MED ORDER — PHENTERMINE HCL 37.5 MG PO CAPS: 37.5000 mg | ORAL_CAPSULE | ORAL | 1 refills | Status: DC

## 2022-11-07 MED ORDER — CYANOCOBALAMIN 1000 MCG/ML IJ SOLN
1000.0000 ug | Freq: Once | INTRAMUSCULAR | Status: AC
  Administered 2022-11-07: 1000 ug via INTRAMUSCULAR

## 2022-11-07 MED ORDER — TOPIRAMATE 25 MG PO TABS: 25.0000 mg | ORAL_TABLET | Freq: Two times a day (BID) | ORAL | 3 refills | Status: DC

## 2022-11-07 NOTE — Patient Instructions (Signed)
Obesity, Adult ?Obesity is having too much body fat. Being obese means that your weight is more than what is healthy for you.  ?BMI (body mass index) is a number that explains how much body fat you have. If you have a BMI of 30 or more, you are obese. ?Obesity can cause serious health problems, such as: ?Stroke. ?Coronary artery disease (CAD). ?Type 2 diabetes. ?Some types of cancer. ?High blood pressure (hypertension). ?High cholesterol. ?Gallbladder stones. ?Obesity can also contribute to: ?Osteoarthritis. ?Sleep apnea. ?Infertility problems. ?What are the causes? ?Eating meals each day that are high in calories, sugar, and fat. ?Drinking a lot of drinks that have sugar in them. ?Being born with genes that may make you more likely to become obese. ?Having a medical condition that causes obesity. ?Taking certain medicines. ?Sitting a lot (having a sedentary lifestyle). ?Not getting enough sleep. ?What increases the risk? ?Having a family history of obesity. ?Living in an area with limited access to: ?Parks, recreation centers, or sidewalks. ?Healthy food choices, such as grocery stores and farmers' markets. ?What are the signs or symptoms? ?The main sign is having too much body fat. ?How is this treated? ?Treatment for this condition often includes changing your lifestyle. Treatment may include: ?Changing your diet. This may include making a healthy meal plan. ?Exercise. This may include activity that causes your heart to beat faster (aerobic exercise) and strength training. Work with your doctor to design a program that works for you. ?Medicine to help you lose weight. This may be used if you are not able to lose one pound a week after 6 weeks of healthy eating and more exercise. ?Treating conditions that cause the obesity. ?Surgery. Options may include gastric banding and gastric bypass. This may be done if: ?Other treatments have not helped to improve your condition. ?You have a BMI of 40 or higher. ?You have  life-threatening health problems related to obesity. ?Follow these instructions at home: ?Eating and drinking ? ?Follow advice from your doctor about what to eat and drink. Your doctor may tell you to: ?Limit fast food, sweets, and processed snack foods. ?Choose low-fat options. For example, choose low-fat milk instead of whole milk. ?Eat five or more servings of fruits or vegetables each day. ?Eat at home more often. This gives you more control over what you eat. ?Choose healthy foods when you eat out. ?Learn to read food labels. This will help you learn how much food is in one serving. ?Keep low-fat snacks available. ?Avoid drinks that have a lot of sugar in them. These include soda, fruit juice, iced tea with sugar, and flavored milk. ?Drink enough water to keep your pee (urine) pale yellow. ?Do not go on fad diets. ?Physical activity ?Exercise often, as told by your doctor. Most adults should get up to 150 minutes of moderate-intensity exercise every week.Ask your doctor: ?What types of exercise are safe for you. ?How often you should exercise. ?Warm up and stretch before being active. ?Do slow stretching after being active (cool down). ?Rest between times of being active. ?Lifestyle ?Work with your doctor and a food expert (dietitian) to set a weight-loss goal that is best for you. ?Limit your screen time. ?Find ways to reward yourself that do not involve food. ?Do not drink alcohol if: ?Your doctor tells you not to drink. ?You are pregnant, may be pregnant, or are planning to become pregnant. ?If you drink alcohol: ?Limit how much you have to: ?0-1 drink a day for women. ?0-2 drinks   a day for men. ?Know how much alcohol is in your drink. In the [place] one drink equals one 12 oz bottle of beer (355 mL), one 5 oz glass of wine (148 mL), or one 1? oz glass of hard liquor (44 mL). ?General instructions ?Keep a weight-loss journal. This can help you keep track of: ?The food that you eat. ?How much exercise you  get. ?Take over-the-counter and prescription medicines only as told by your doctor. ?Take vitamins and supplements only as told by your doctor. ?Think about joining a support group. ?Pay attention to your mental health as obesity can lead to depression or self esteem issues. ?Keep all follow-up visits. ?Contact a doctor if: ?You cannot meet your weight-loss goal after you have changed your diet and lifestyle for 6 weeks. ?You are having trouble breathing. ?Summary ?Obesity is having too much body fat. ?Being obese means that your weight is more than what is healthy for you. ?Work with your doctor to set a weight-loss goal. ?Get regular exercise as told by your doctor. ?This information is not intended to replace advice given to you by your health care provider. Make sure you discuss any questions you have with your health care provider. ?Document Revised: 06/28/2021 Document Reviewed: 06/28/2021 ?Elsevier Patient Education ? 2023 Elsevier Inc. ? ?

## 2022-12-11 NOTE — Progress Notes (Unsigned)
    GYNECOLOGY PROGRESS NOTE  Subjective:    Patient ID: Danielle Gillespie, female    DOB: Sep 14, 1991, 32 y.o.   MRN: [id number]  HPI  Patient is a 32 y.o. female who presents for 4 month weight management follow up. She has a past history of obesity, and mild dyslipidemia. She initiated use of Phentermine, 4 months ago.  Added Topamax last visit for increased craving suppression. Does note some tingling in her legs and some swelling.  Likely secondary to the Topamax.    Current interventions:  1. Diet - Continues everything that she was doing at her previous visit. ( She has decrease the amount of protein shakes. Drinks protein shakes in the morning only. Increased fruit and nut intake.) 2. Activity - She has been walking less due to the weather. Continues to do strength training for 30 minutes 3-4 x weekly. 3. Reports bowel movements are only once daily.   The following portions of the patient's history were reviewed and updated as appropriate: allergies, current medications, past family history, past medical history, past social history, past surgical history, and problem list.  Review of Systems Pertinent items are noted in HPI.   Objective:       12/12/2022   11:03 AM 11/07/2022   10:48 AM 10/04/2022    8:06 AM  Vitals with BMI  Height 5\' 3"  5\' 2"    Weight 174 lbs 13 oz 176 lbs 177 lbs 14 oz  BMI 16.60 63.01   Systolic 601  093  Diastolic 70  72  Pulse 89  97    General appearance: alert, cooperative, and no distress Abdomen: soft, non-tender.  Waist circumference 34 in.    Labs:   Assessment:   Weight management Obesity, Body mass index is 30.96 kg/m.  Plan:   Weight management  -patient with very minimal weight loss over the past several months despite increasing her phentermine dosing each visit.  I discussed that it is likely recommended that patient try an alternative weight loss medication.  Would recommend Wegovy/Saxenda/Contrave as patient with no prior history  of diabetes and unable to utilize other GLP-1s.  Patient also limitations in options due to  insurance coverage (Medicaid). WIll check with pharmacies to see which is available in stock.  In the meantime, will prescribe one more month of Phentermine with alternating doses for 1 month.  B12 injection given today.   To f/u in 1 month.    A total of 15 minutes were spent face-to-face with the patient during this encounter and over half of that time dealt with counseling and coordination of care.   Rubie Maid, MD [facility]

## 2022-12-12 ENCOUNTER — Ambulatory Visit (INDEPENDENT_AMBULATORY_CARE_PROVIDER_SITE_OTHER): Payer: Medicaid Other | Admitting: Obstetrics and Gynecology

## 2022-12-12 VITALS — BP 125/70 | HR 89 | Resp 16 | Ht 63.0 in | Wt 174.8 lb

## 2022-12-12 DIAGNOSIS — E669 Obesity, unspecified: Secondary | ICD-10-CM | POA: Diagnosis not present

## 2022-12-12 DIAGNOSIS — Z7689 Persons encountering health services in other specified circumstances: Secondary | ICD-10-CM

## 2022-12-12 DIAGNOSIS — E7800 Pure hypercholesterolemia, unspecified: Secondary | ICD-10-CM

## 2022-12-12 DIAGNOSIS — Z6830 Body mass index (BMI) 30.0-30.9, adult: Secondary | ICD-10-CM | POA: Diagnosis not present

## 2022-12-12 DIAGNOSIS — Z713 Dietary counseling and surveillance: Secondary | ICD-10-CM

## 2022-12-12 MED ORDER — MOUNJARO 5 MG/0.5ML ~~LOC~~ SOAJ: 5.0000 mg | SUBCUTANEOUS | 6 refills | Status: DC

## 2022-12-12 MED ORDER — PHENTERMINE HCL 37.5 MG PO CAPS: 37.5000 mg | ORAL_CAPSULE | ORAL | 0 refills | Status: DC

## 2022-12-12 MED ORDER — MOUNJARO 2.5 MG/0.5ML ~~LOC~~ SOAJ: 2.5000 mg | SUBCUTANEOUS | 0 refills | Status: DC

## 2022-12-12 MED ORDER — CYANOCOBALAMIN 1000 MCG/ML IJ SOLN
1000.0000 ug | Freq: Once | INTRAMUSCULAR | Status: AC
  Administered 2022-12-12: 1000 ug via INTRAMUSCULAR

## 2022-12-13 ENCOUNTER — Encounter: Payer: Self-pay | Admitting: Obstetrics and Gynecology

## 2022-12-21 ENCOUNTER — Encounter: Payer: Self-pay | Admitting: Obstetrics and Gynecology

## 2022-12-21 ENCOUNTER — Other Ambulatory Visit: Payer: Self-pay | Admitting: Obstetrics and Gynecology

## 2022-12-21 MED ORDER — ZEPBOUND 7.5 MG/0.5ML ~~LOC~~ SOAJ: 7.5000 mg | SUBCUTANEOUS | 6 refills | Status: DC

## 2022-12-21 MED ORDER — ZEPBOUND 5 MG/0.5ML ~~LOC~~ SOAJ: 5.0000 mg | SUBCUTANEOUS | 0 refills | Status: DC

## 2022-12-21 MED ORDER — ZEPBOUND 2.5 MG/0.5ML ~~LOC~~ SOAJ: 2.5000 mg | SUBCUTANEOUS | 0 refills | Status: DC

## 2023-01-09 MED ORDER — NORELGESTROMIN-ETH ESTRADIOL 150-35 MCG/24HR TD PTWK: 1.0000 | MEDICATED_PATCH | TRANSDERMAL | 4 refills | Status: DC

## 2023-01-09 MED ORDER — MOUNJARO 5 MG/0.5ML ~~LOC~~ SOAJ: 5.0000 mg | SUBCUTANEOUS | 0 refills | Status: DC

## 2023-01-09 MED ORDER — MOUNJARO 7.5 MG/0.5ML ~~LOC~~ SOAJ: 7.5000 mg | SUBCUTANEOUS | 6 refills | Status: DC

## 2023-01-09 NOTE — Addendum Note (Signed)
Addended by: Augusto Gamble on: 01/09/2023 08:57 PM   Modules accepted: Orders

## 2023-01-12 ENCOUNTER — Encounter: Payer: Self-pay | Admitting: Nurse Practitioner

## 2023-02-12 ENCOUNTER — Encounter: Payer: Self-pay | Admitting: Obstetrics and Gynecology

## 2023-03-14 ENCOUNTER — Telehealth: Payer: Self-pay

## 2023-03-14 NOTE — Telephone Encounter (Signed)
PA recv'd for tirzepatide [facility]) 5 MG/0.5ML Pen

## 2023-03-16 NOTE — Telephone Encounter (Signed)
System unable to process d/t previous PA Request Denied.  Request archived in covermymeds.

## 2023-03-20 ENCOUNTER — Encounter: Payer: Self-pay | Admitting: Obstetrics and Gynecology

## 2023-03-22 MED ORDER — NORGESTIMATE-ETH ESTRADIOL 0.25-35 MG-MCG PO TABS: 1.0000 | ORAL_TABLET | Freq: Every day | ORAL | 3 refills | Status: DC

## 2023-04-04 ENCOUNTER — Encounter: Payer: Self-pay | Admitting: Nurse Practitioner

## 2023-05-04 ENCOUNTER — Encounter: Payer: Medicaid Other | Admitting: Nurse Practitioner

## 2023-05-08 ENCOUNTER — Encounter: Payer: Medicaid Other | Admitting: Nurse Practitioner

## 2023-05-12 ENCOUNTER — Other Ambulatory Visit: Payer: Self-pay | Admitting: Obstetrics and Gynecology

## 2023-05-16 ENCOUNTER — Encounter: Payer: Self-pay | Admitting: Nurse Practitioner

## 2023-05-16 ENCOUNTER — Ambulatory Visit (INDEPENDENT_AMBULATORY_CARE_PROVIDER_SITE_OTHER): Payer: Medicaid Other | Admitting: Nurse Practitioner

## 2023-05-16 VITALS — BP 114/72 | HR 69 | Temp 98.0°F | Ht 62.01 in | Wt 162.0 lb

## 2023-05-16 DIAGNOSIS — E538 Deficiency of other specified B group vitamins: Secondary | ICD-10-CM | POA: Diagnosis not present

## 2023-05-16 DIAGNOSIS — F3281 Premenstrual dysphoric disorder: Secondary | ICD-10-CM | POA: Insufficient documentation

## 2023-05-16 DIAGNOSIS — E559 Vitamin D deficiency, unspecified: Secondary | ICD-10-CM

## 2023-05-16 DIAGNOSIS — D72828 Other elevated white blood cell count: Secondary | ICD-10-CM

## 2023-05-16 DIAGNOSIS — F4321 Adjustment disorder with depressed mood: Secondary | ICD-10-CM

## 2023-05-16 DIAGNOSIS — E7800 Pure hypercholesterolemia, unspecified: Secondary | ICD-10-CM

## 2023-05-16 DIAGNOSIS — J301 Allergic rhinitis due to pollen: Secondary | ICD-10-CM | POA: Diagnosis not present

## 2023-05-16 DIAGNOSIS — D729 Disorder of white blood cells, unspecified: Secondary | ICD-10-CM

## 2023-05-16 DIAGNOSIS — Z0000 Encounter for general adult medical examination without abnormal findings: Secondary | ICD-10-CM

## 2023-05-16 DIAGNOSIS — J309 Allergic rhinitis, unspecified: Secondary | ICD-10-CM | POA: Insufficient documentation

## 2023-05-16 DIAGNOSIS — Z720 Tobacco use: Secondary | ICD-10-CM

## 2023-05-16 DIAGNOSIS — Z7289 Other problems related to lifestyle: Secondary | ICD-10-CM

## 2023-05-16 DIAGNOSIS — F4320 Adjustment disorder, unspecified: Secondary | ICD-10-CM

## 2023-05-16 DIAGNOSIS — E6609 Other obesity due to excess calories: Secondary | ICD-10-CM

## 2023-05-16 MED ORDER — FLUOXETINE HCL 10 MG PO TABS
10.0000 mg | ORAL_TABLET | Freq: Every day | ORAL | 3 refills | Status: DC
Start: 2023-05-16 — End: 2023-06-08

## 2023-05-16 MED ORDER — HYDROXYZINE PAMOATE 25 MG PO CAPS: 25.0000 mg | ORAL_CAPSULE | Freq: Three times a day (TID) | ORAL | 4 refills | Status: DC | PRN

## 2023-05-16 MED ORDER — LORATADINE 10 MG PO TABS
10.0000 mg | ORAL_TABLET | Freq: Every day | ORAL | 11 refills | Status: DC
Start: 2023-05-16 — End: 2023-06-27

## 2023-05-16 NOTE — Assessment & Plan Note (Signed)
Ongoing since loss of brother.  Denies SI/HI. Discussed with patient.  Will start Prozac 10 MG daily, which may offer benefit to mood and energy, + add on Vistaril 25 MG PRN for increased anxiety (recommend she try at night for sleep).  Educated her on medications and use + BLACK BOX warning.  Recommend she take consistently + recommend she look into grief support group via hospice.  Return in 6 weeks.

## 2023-05-16 NOTE — Patient Instructions (Signed)
Managing Loss, Adult People experience loss in many different ways throughout their lives. Events such as moving, changing jobs, and losing friends can create a sense of loss. The loss may be as serious as a major health change, divorce, death of a pet, or death of a loved one. All of these types of loss are likely to create a physical and emotional reaction known as grief. Grief is the result of a major change or an absence of something or someone that you count on. Grief is a normal reaction to loss. A variety of factors can affect your grieving experience, including: The nature of your loss. Your relationship to what or whom you lost. Your understanding of grief and how to manage it. Your support system. Be aware that when grief becomes extreme, it can lead to more severe issues like isolation, depression, anxiety, or suicidal thoughts. Talk with your health care provider if you have any of these issues. How to manage lifestyle changes Keep to your normal routine as much as possible. If you have trouble focusing or doing normal activities, it is acceptable to take some time away from your normal routine. Spend time with friends and loved ones. Eat a healthy diet, get plenty of sleep, and rest when you feel tired. How to recognize changes  The way that you deal with your grief will affect your ability to function as you normally do. When grieving, you may experience these changes: Numbness, shock, sadness, anxiety, anger, denial, and guilt. Thoughts about death. Unexpected crying. A physical sensation of emptiness in your stomach. Problems sleeping and eating. Tiredness (fatigue). Loss of interest in normal activities. Dreaming about or imagining seeing the person who died. A need to remember what or whom you lost. Difficulty thinking about anything other than your loss for a period of time. Relief. If you have been expecting the loss for a while, you may feel a sense of relief when it  happens. Follow these instructions at home: Activity Express your feelings in healthy ways, such as: Talking with others about your loss. It may be helpful to find others who have had a similar loss, such as a support group. Writing down your feelings in a journal. Doing physical activities to release stress and emotional energy. Doing creative activities like painting, sculpting, or playing or listening to music. Practicing resilience. This is the ability to recover and adjust after facing challenges. Reading some resources that encourage resilience may help you to learn ways to practice those behaviors.  General instructions Be patient with yourself and others. Allow the grieving process to happen, and remember that grieving takes time. It is likely that you may never feel completely done with some grief. You may find a way to move on while still cherishing memories and feelings about your loss. Accepting your loss is a process. It can take months or longer to adjust. Keep all follow-up visits. This is important. Where to find support To get support for managing loss: Ask your health care provider for help and recommendations, such as grief counseling or therapy. Think about joining a support group for people who are managing a loss. Where to find more information You can find more information about managing loss from: American Society of Clinical Oncology: www.cancer.net American Psychological Association: www.apa.org Contact a health care provider if: Your grief is extreme and keeps getting worse. You have ongoing grief that does not improve. Your body shows symptoms of grief, such as illness. You feel depressed, anxious, or   hopeless. Get help right away if: You have thoughts about hurting yourself or others. Get help right away if you feel like you may hurt yourself or others, or have thoughts about taking your own life. Go to your nearest emergency room or: Call 911. Call the  National Suicide Prevention Lifeline at 1-800-273-8255 or 988. This is open 24 hours a day. Text the Crisis Text Line at 741741. Summary Grief is the result of a major change or an absence of someone or something that you count on. Grief is a normal reaction to loss. The depth of grief and the period of recovery depend on the type of loss and your ability to adjust to the change and process your feelings. Processing grief requires patience and a willingness to accept your feelings and talk about your loss with people who are supportive. It is important to find resources that work for you and to realize that people experience grief differently. There is not one grieving process that works for everyone in the same way. Be aware that when grief becomes extreme, it can lead to more severe issues like isolation, depression, anxiety, or suicidal thoughts. Talk with your health care provider if you have any of these issues. This information is not intended to replace advice given to you by your health care provider. Make sure you discuss any questions you have with your health care provider. Document Revised: 07/11/2021 Document Reviewed: 07/11/2021 Elsevier Patient Education  2024 Elsevier Inc.  

## 2023-05-16 NOTE — Assessment & Plan Note (Signed)
Noted on past labs.  Recheck fasting labs today and continue focus on diet changes and regular activity.

## 2023-05-16 NOTE — Assessment & Plan Note (Signed)
Ongoing, will trial Claritin 10 MG daily and see if benefit from this.  Consider Flonase if ongoing ear issues.  Overall exam stable today.

## 2023-05-16 NOTE — Assessment & Plan Note (Signed)
Ongoing.  Noted on past labs with reported history of anemia -- not taking supplement.  Recheck level today.

## 2023-05-16 NOTE — Progress Notes (Signed)
BP 114/72   Pulse 69   Temp 98 F (36.7 C) (Oral)   Ht 5' 2.01" (1.575 m)   Wt 162 lb (73.5 kg)   SpO2 100%   BMI 29.62 kg/m    Subjective:    Patient ID: Danielle Gillespie, female    DOB: Nov 11, 1991, 32 y.o.   MRN: [id number]  HPI: Munirah Gillespie is a 32 y.o. female presenting on 05/16/2023 for comprehensive medical examination. Current medical complaints include: none  She currently lives with: husband and children Menopausal Symptoms: no  Has known low Vitamin D and B12 -- not currently taking supplements.  Was on Mounjaro for one month, prescribed by Dr. Valentino Saxon and this helped levels she reports.  Reports having ongoing ear popping since ear infection.  Seen by hematology for neutrophilia on 03/15/22, did not attend follow-up.  Does continue to vape nicotine, unsure how often she uses.  DEPRESSION History of postpartum depression -- no current medications.  Lost her youngest brother at the end of April due to addiction -- since then has had some anhedonia. In past took Celexa and Buspar. Mood status: uncontrolled Psychotherapy/counseling: none Depressed mood:  grieving Anxious mood: yes Anhedonia: yes Significant weight loss or gain: no Insomnia: yes hard to stay asleep Fatigue: yes Feelings of worthlessness or guilt: yes Impaired concentration/indecisiveness: no Suicidal ideations: no Hopelessness: no Crying spells: yes    05/16/2023    2:26 PM 10/04/2022    8:30 AM 09/05/2022   10:57 AM 05/02/2022    2:17 PM 06/08/2021   11:25 AM  Depression screen PHQ 2/9  Decreased Interest 0 0 0 0 1  Down, Depressed, Hopeless 0 0 0 0 2  PHQ - 2 Score 0 0 0 0 3  Altered sleeping 3 0  0 0  Tired, decreased energy 3 0  0 2  Change in appetite 0 0  0 3  Feeling bad or failure about yourself  1 0  0 1  Trouble concentrating 0 0  0 0  Moving slowly or fidgety/restless 0 0  0 0  Suicidal thoughts 0 0  0 0  PHQ-9 Score 7 0  0 9  Difficult doing work/chores Somewhat difficult Not  difficult at all  Not difficult at all Somewhat difficult       05/16/2023    2:26 PM 10/04/2022    8:31 AM 05/02/2022    2:17 PM 06/08/2021   11:26 AM  GAD 7 : Generalized Anxiety Score  Nervous, Anxious, on Edge 3 0 0 2  Control/stop worrying 3 0 0 2  Worry too much - different things 3 0 0 2  Trouble relaxing 2 0 0 2  Restless 1 0 0 1  Easily annoyed or irritable 1 0 0 0  Afraid - awful might happen 2 0 0 2  Total GAD 7 Score 15 0 0 11  Anxiety Difficulty Somewhat difficult Not difficult at all Not difficult at all Somewhat difficult      05/02/2022    2:17 PM 05/02/2022    2:28 PM 09/05/2022   10:57 AM 11/07/2022   10:51 AM 05/16/2023    2:26 PM  Fall Risk  Falls in the past year? 0 0 0 0 0  Was there an injury with Fall? 0 0 0 0 0  Fall Risk Category Calculator 0 0 0 0 0  Fall Risk Category (Retired) Low Low Low Low   (RETIRED) Patient Fall Risk Level Low fall risk Low fall risk  Low fall risk Low fall risk   Patient at Risk for Falls Due to No Fall Risks No Fall Risks No Fall Risks No Fall Risks No Fall Risks  Fall risk Follow up Falls evaluation completed  Falls evaluation completed Falls evaluation completed Falls evaluation completed    Functional Status Survey: Is the patient deaf or have difficulty hearing?: No Does the patient have difficulty seeing, even when wearing glasses/contacts?: No Does the patient have difficulty concentrating, remembering, or making decisions?: No Does the patient have difficulty walking or climbing stairs?: No Does the patient have difficulty dressing or bathing?: No Does the patient have difficulty doing errands alone such as visiting a doctor's office or shopping?: No    Past Medical History:  Past Medical History:  Diagnosis Date   Anxiety    Depression     Surgical History:  Past Surgical History:  Procedure Laterality Date   NO PAST SURGERIES      Medications:  Current Outpatient Medications on File Prior to Visit   Medication Sig   norgestimate-ethinyl estradiol (ORTHO-CYCLEN) 0.25-35 MG-MCG tablet Take 1 tablet by mouth daily.   cholecalciferol (VITAMIN D3) 25 MCG (1000 UNIT) tablet Take 1,000 Units by mouth daily. (Patient not taking: Reported on 05/16/2023)   tirzepatide [facility]) 2.5 MG/0.5ML Pen INJECT 2.5 MG SUBCUTANEOUSLY WEEKLY (Patient not taking: Reported on 05/16/2023)   No current facility-administered medications on file prior to visit.    Allergies:  No Known Allergies  Social History:  Social History   Socioeconomic History   Marital status: Significant Other    Spouse name: Not on file   Number of children: Not on file   Years of education: Not on file   Highest education level: Not on file  Occupational History   Not on file  Tobacco Use   Smoking status: Every Day    Types: E-cigarettes   Smokeless tobacco: Never   Tobacco comments:    09/16/20 pt states she smoked half a cigarrete yesterday.    Stopped cigarettes in 2022  Vaping Use   Vaping Use: Every day   Substances: Nicotine, Flavoring  Substance and Sexual Activity   Alcohol use: Yes    Comment: rarely   Drug use: Not Currently    Types: Marijuana    Comment: one time smoke marijuana end March 2023   Sexual activity: Yes    Partners: Male    Birth control/protection: Pill  Other Topics Concern   Not on file  Social History Narrative   Not on file   Social Determinants of Health   Financial Resource Strain: Low Risk  (04/27/2021)   Overall Financial Resource Strain (CARDIA)    Difficulty of Paying Living Expenses: Not hard at all  Food Insecurity: No Food Insecurity (04/27/2021)   Hunger Vital Sign    Worried About Running Out of Food in the Last Year: Never true    Ran Out of Food in the Last Year: Never true  Transportation Needs: No Transportation Needs (04/27/2021)   PRAPARE - Administrator, Civil Service (Medical): No    Lack of Transportation (Non-Medical): No  Physical Activity:  Inactive (04/27/2021)   Exercise Vital Sign    Days of Exercise per Week: 0 days    Minutes of Exercise per Session: 0 min  Stress: Stress Concern Present (04/27/2021)   [employer] of Occupational Health - Occupational Stress Questionnaire    Feeling of Stress : To some extent  Social Connections: Moderately Isolated (  04/27/2021)   Social Connection and Isolation Panel [NHANES]    Frequency of Communication with Friends and Family: Three times a week    Frequency of Social Gatherings with Friends and Family: Three times a week    Attends Religious Services: Never    Active Member of Clubs or Organizations: No    Attends Banker Meetings: Never    Marital Status: Living with partner  Intimate Partner Violence: Not At Risk (04/27/2021)   Humiliation, Afraid, Rape, and Kick questionnaire    Fear of Current or Ex-Partner: No    Emotionally Abused: No    Physically Abused: No    Sexually Abused: No   Social History   Tobacco Use  Smoking Status Every Day   Types: E-cigarettes  Smokeless Tobacco Never  Tobacco Comments   09/16/20 pt states she smoked half a cigarrete yesterday.   Stopped cigarettes in 2022   Social History   Substance and Sexual Activity  Alcohol Use Yes   Comment: rarely    Family History:  Family History  Problem Relation Age of Onset   Depression Mother    Diabetes Maternal Grandmother    Bipolar disorder Maternal Grandmother    Heart disease Maternal Grandmother    Depression Maternal Grandmother    Healthy Father    Dementia Paternal Grandmother    Depression Sister    Depression Brother    Past medical history, surgical history, medications, allergies, family history and social history reviewed with patient today and changes made to appropriate areas of the chart.   ROS All other ROS negative except what is listed above and in the HPI.      Objective:    BP 114/72   Pulse 69   Temp 98 F (36.7 C) (Oral)   Ht 5' 2.01"  (1.575 m)   Wt 162 lb (73.5 kg)   SpO2 100%   BMI 29.62 kg/m   Wt Readings from Last 3 Encounters:  05/16/23 162 lb (73.5 kg)  12/12/22 174 lb 12.8 oz (79.3 kg)  11/07/22 176 lb (79.8 kg)    Physical Exam Vitals and nursing note reviewed. Exam conducted with a chaperone present.  Constitutional:      General: She is awake. She is not in acute distress.    Appearance: She is well-developed and well-groomed. She is not ill-appearing or toxic-appearing.  HENT:     Head: Normocephalic and atraumatic.     Right Ear: Hearing, ear canal and external ear normal. No drainage. A middle ear effusion is present. There is no impacted cerumen. Tympanic membrane is not injected or perforated.     Left Ear: Hearing, ear canal and external ear normal. No drainage. A middle ear effusion is present. There is no impacted cerumen. Tympanic membrane is not injected or perforated.     Nose: Nose normal.     Right Sinus: No maxillary sinus tenderness or frontal sinus tenderness.     Left Sinus: No maxillary sinus tenderness or frontal sinus tenderness.     Mouth/Throat:     Mouth: Mucous membranes are moist.     Pharynx: Oropharynx is clear. Uvula midline. No pharyngeal swelling, oropharyngeal exudate or posterior oropharyngeal erythema.  Eyes:     General: Lids are normal.        Right eye: No discharge.        Left eye: No discharge.     Extraocular Movements: Extraocular movements intact.     Conjunctiva/sclera: Conjunctivae normal.  Pupils: Pupils are equal, round, and reactive to light.     Visual Fields: Right eye visual fields normal and left eye visual fields normal.  Neck:     Thyroid: No thyromegaly.     Vascular: No carotid bruit.     Trachea: Trachea normal.  Cardiovascular:     Rate and Rhythm: Normal rate and regular rhythm.     Heart sounds: Normal heart sounds. No murmur heard.    No gallop.  Pulmonary:     Effort: Pulmonary effort is normal. No accessory muscle usage or  respiratory distress.     Breath sounds: Normal breath sounds.  Abdominal:     General: Bowel sounds are normal.     Palpations: Abdomen is soft. There is no hepatomegaly or splenomegaly.     Tenderness: There is no abdominal tenderness.  Musculoskeletal:        General: Normal range of motion.     Cervical back: Normal range of motion and neck supple.     Right lower leg: No edema.     Left lower leg: No edema.  Lymphadenopathy:     Head:     Right side of head: No submental, submandibular, tonsillar, preauricular or posterior auricular adenopathy.     Left side of head: No submental, submandibular, tonsillar, preauricular or posterior auricular adenopathy.     Cervical: No cervical adenopathy.  Skin:    General: Skin is warm and dry.     Capillary Refill: Capillary refill takes less than 2 seconds.     Findings: No rash.  Neurological:     Mental Status: She is alert and oriented to person, place, and time.     Gait: Gait is intact.     Deep Tendon Reflexes: Reflexes are normal and symmetric.     Reflex Scores:      Brachioradialis reflexes are 2+ on the right side and 2+ on the left side.      Patellar reflexes are 2+ on the right side and 2+ on the left side. Psychiatric:        Attention and Perception: Attention normal.        Mood and Affect: Mood normal.        Speech: Speech normal.        Behavior: Behavior normal. Behavior is cooperative.        Thought Content: Thought content normal.        Judgment: Judgment normal.     Results for orders placed or performed in visit on 10/04/22  WET PREP FOR TRICH, YEAST, CLUE   Specimen: Sterile Swab   Sterile Swab  Result Value Ref Range   Trichomonas Exam Negative Negative   Yeast Exam Negative Negative   Clue Cell Exam Negative Negative  Urinalysis, Routine w reflex microscopic  Result Value Ref Range   Specific Gravity, UA 1.020 1.005 - 1.030   pH, UA 7.0 5.0 - 7.5   Color, UA Yellow Yellow   Appearance Ur Clear  Clear   Leukocytes,UA Negative Negative   Protein,UA Negative Negative/Trace   Glucose, UA Negative Negative   Ketones, UA Trace (A) Negative   RBC, UA Negative Negative   Bilirubin, UA Negative Negative   Urobilinogen, Ur 1.0 0.2 - 1.0 mg/dL   Nitrite, UA Negative Negative   Microscopic Examination Comment       Assessment & Plan:   Problem List Items Addressed This Visit       Respiratory   Allergic rhinitis  Ongoing, will trial Claritin 10 MG daily and see if benefit from this.  Consider Flonase if ongoing ear issues.  Overall exam stable today.        Other   Elevated low density lipoprotein (LDL) cholesterol level - Primary    Noted on past labs.  Recheck fasting labs today and continue focus on diet changes and regular activity.      Relevant Orders   Comprehensive metabolic panel   Lipid Panel w/o Chol/HDL Ratio   Grief reaction    Ongoing since loss of brother.  Denies SI/HI. Discussed with patient.  Will start Prozac 10 MG daily, which may offer benefit to mood and energy, + add on Vistaril 25 MG PRN for increased anxiety (recommend she try at night for sleep).  Educated her on medications and use + BLACK BOX warning.  Recommend she take consistently + recommend she look into grief support group via hospice.  Return in 6 weeks.      Neutrophilia    Noted on labs, recheck CBC today and recommend she schedule follow-up with hematology as needed.      Relevant Orders   CBC with Differential/Platelet   TSH   Vapes nicotine containing substance    Ongoing, previously smoked cigarettes, recommend complete cessation of vaping.      Vitamin B12 deficiency    Ongoing.  Noted on past labs with reported history of anemia -- not taking supplement.  Recheck level today.      Relevant Orders   CBC with Differential/Platelet   Vitamin B12   Vitamin D deficiency    Ongoing.  Noted on past labs.  Took supplement for period, recommend she take consistently.  Recheck  today.      Relevant Orders   VITAMIN D 25 Hydroxy (Vit-D Deficiency, Fractures)   Other Visit Diagnoses     Encounter for annual physical exam       Annual physical today with labs and health maintenance reviewed, discussed with patient.        Follow up plan: Return in about 6 weeks (around 06/27/2023) for Grief -- started Prozac and Vistaril.   LABORATORY TESTING:  - Pap smear: up to date  IMMUNIZATIONS:   - Tdap: Tetanus vaccination status reviewed: last tetanus booster within 10 years. - Influenza: Refused - Pneumovax: Not applicable - Prevnar: Not applicable - COVID: Refused - HPV: Not applicable - Shingrix vaccine: Not applicable  SCREENING: -Mammogram: Not applicable  - Colonoscopy: Not applicable  - Bone Density: Not applicable  -Hearing Test: Not applicable  -Spirometry: Not applicable   PATIENT COUNSELING:   Advised to take 1 mg of folate supplement per day if capable of pregnancy.   Sexuality: Discussed sexually transmitted diseases, partner selection, use of condoms, avoidance of unintended pregnancy  and contraceptive alternatives.   Advised to avoid cigarette smoking.  I discussed with the patient that most people either abstain from alcohol or drink within safe limits (<=14/week and <=4 drinks/occasion for males, <=7/weeks and <= 3 drinks/occasion for females) and that the risk for alcohol disorders and other health effects rises proportionally with the number of drinks per week and how often a drinker exceeds daily limits.  Discussed cessation/primary prevention of drug use and availability of treatment for abuse.   Diet: Encouraged to adjust caloric intake to maintain  or achieve ideal body weight, to reduce intake of dietary saturated fat and total fat, to limit sodium intake by avoiding high sodium foods and not adding table  salt, and to maintain adequate dietary potassium and calcium preferably from fresh fruits, vegetables, and low-fat dairy  products.    Stressed the importance of regular exercise  Injury prevention: Discussed safety belts, safety helmets, smoke detector, smoking near bedding or upholstery.   Dental health: Discussed importance of regular tooth brushing, flossing, and dental visits.    NEXT PREVENTATIVE PHYSICAL DUE IN 1 YEAR. Return in about 6 weeks (around 06/27/2023) for Grief -- started Prozac and Vistaril.

## 2023-05-16 NOTE — Assessment & Plan Note (Signed)
Ongoing, previously smoked cigarettes, recommend complete cessation of vaping. 

## 2023-05-16 NOTE — Assessment & Plan Note (Signed)
Noted on labs, recheck CBC today and recommend she schedule follow-up with hematology as needed.

## 2023-05-16 NOTE — Assessment & Plan Note (Signed)
Ongoing.  Noted on past labs.  Took supplement for period, recommend she take consistently.  Recheck today.

## 2023-05-17 LAB — COMPREHENSIVE METABOLIC PANEL
ALT: 16 IU/L (ref 0–32)
AST: 16 IU/L (ref 0–40)
Albumin/Globulin Ratio: 1.4
Albumin: 3.9 g/dL (ref 3.9–4.9)
Alkaline Phosphatase: 59 IU/L (ref 44–121)
BUN/Creatinine Ratio: 10 (ref 9–23)
BUN: 8 mg/dL (ref 6–20)
Bilirubin Total: <0.2 mg/dL (ref 0.0–1.2)
CO2: 22 mmol/L (ref 20–29)
Calcium: 9.5 mg/dL (ref 8.7–10.2)
Chloride: 103 mmol/L (ref 96–106)
Creatinine, Ser: 0.78 mg/dL (ref 0.57–1.00)
Globulin, Total: 2.7 g/dL (ref 1.5–4.5)
Glucose: 91 mg/dL (ref 70–99)
Potassium: 4.5 mmol/L (ref 3.5–5.2)
Sodium: 137 mmol/L (ref 134–144)
Total Protein: 6.6 g/dL (ref 6.0–8.5)
eGFR: 103 mL/min/{1.73_m2} (ref >59)

## 2023-05-17 LAB — CBC WITH DIFFERENTIAL/PLATELET
Basophils Absolute: 0.1 10*3/uL (ref 0.0–0.2)
Basos: 0 %
EOS (ABSOLUTE): 0.1 10*3/uL (ref 0.0–0.4)
Eos: 1 %
Hematocrit: 38.6 % (ref 34.0–46.6)
Hemoglobin: 12.9 g/dL (ref 11.1–15.9)
Immature Grans (Abs): 0 10*3/uL (ref 0.0–0.1)
Immature Granulocytes: 0 %
Lymphocytes Absolute: 2.8 10*3/uL (ref 0.7–3.1)
Lymphs: 25 %
MCH: 31.5 pg (ref 26.6–33.0)
MCHC: 33.4 g/dL (ref 31.5–35.7)
MCV: 94 fL (ref 79–97)
Monocytes Absolute: 0.5 10*3/uL (ref 0.1–0.9)
Monocytes: 5 %
Neutrophils Absolute: 7.6 10*3/uL — ABNORMAL HIGH (ref 1.4–7.0)
Neutrophils: 69 %
Platelets: 376 10*3/uL (ref 150–450)
RBC: 4.1 x10E6/uL (ref 3.77–5.28)
RDW: 12.1 % (ref 11.7–15.4)
WBC: 11.2 10*3/uL — ABNORMAL HIGH (ref 3.4–10.8)

## 2023-05-17 LAB — VITAMIN D 25 HYDROXY (VIT D DEFICIENCY, FRACTURES): Vit D, 25-Hydroxy: 34.3 ng/mL (ref 30.0–100.0)

## 2023-05-17 LAB — LIPID PANEL W/O CHOL/HDL RATIO
Cholesterol, Total: 194 mg/dL (ref 100–199)
HDL: 53 mg/dL (ref >39)
LDL Chol Calc (NIH): 118 mg/dL — ABNORMAL HIGH (ref 0–99)
Triglycerides: 129 mg/dL (ref 0–149)
VLDL Cholesterol Cal: 23 mg/dL (ref 5–40)

## 2023-05-17 LAB — VITAMIN B12: Vitamin B-12: 414 pg/mL (ref 232–1245)

## 2023-05-17 LAB — TSH: TSH: 1.28 u[IU]/mL (ref 0.450–4.500)

## 2023-05-17 NOTE — Progress Notes (Signed)
Contacted via MyChart   Good morning Danielle Gillespie, your labs have returned and overall remain at baseline: - CBC is showing mild elevation in WBC and neutrophils per baseline.  If these ever trend up to higher levels we will get you back into hematology.  For now recommend cutting back on vaping. - Kidney function, creatinine and eGFR, remains normal, as is liver function, AST and ALT. Thyroid level stable (TSH). - Vitamin D level stable, recommend taking 2000 units Vitamin D3 daily to maintain stable levels. B12 level is pending and will let you know if abnormal. - Your LDL is above normal. The LDL is the bad cholesterol. Over time and in combination with inflammation and other factors, this contributes to plaque which in turn may lead to stroke and/or heart attack down the road. Sometimes high LDL is primarily genetic, and people might be eating all the right foods but still have high numbers. Other times, there is room for improvement in one's diet and eating healthier can bring this number down and potentially reduce one's risk of heart attack and/or stroke.   To reduce your LDL, Remember - more fruits and vegetables, more fish, and limit red meat and dairy products. More soy, nuts, beans, barley, lentils, oats and plant sterol ester enriched margarine instead of butter. I also encourage eliminating sugar and processed food. Remember, shop on the outside of the grocery store and visit your [employer].  Any questions? Keep being amazing!!  Thank you for allowing me to participate in your care.  I appreciate you. Kindest regards, Ellana Kawa

## 2023-05-17 NOTE — Progress Notes (Signed)
Contacted via MyChart   Your B12 level is on lower side of normal -- definitely recommend taking Vitamin B12 1000 MCG daily.

## 2023-06-08 ENCOUNTER — Encounter: Payer: Self-pay | Admitting: Obstetrics and Gynecology

## 2023-06-08 ENCOUNTER — Other Ambulatory Visit: Payer: Self-pay | Admitting: Nurse Practitioner

## 2023-06-08 NOTE — Telephone Encounter (Signed)
Changed to 90 days using the refills that were remaining on the previous order.  Requested Prescriptions  Pending Prescriptions Disp Refills   FLUoxetine (PROZAC) 10 MG capsule [Pharmacy Med Name: FLUOXETINE HCL 10 MG CAPSULE] 90 capsule 0    Sig: TAKE 1 CAPSULE BY MOUTH EVERY DAY     Psychiatry:  Antidepressants - SSRI Passed - 06/08/2023 10:31 AM      Passed - Completed PHQ-2 or PHQ-9 in the last 360 days      Passed - Valid encounter within last 6 months    Recent Outpatient Visits           3 weeks ago Elevated low density lipoprotein (LDL) cholesterol level   [place] [facility] [place], Picayune T, NP   8 months ago Otalgia of both ears   [place] [facility] Lorenzo, Eva T, NP   1 year ago History of postpartum depression   [place] [facility] [place], Corrie Dandy T, NP   1 year ago Rash   [place] [facility] Coy, Corrie Dandy T, NP   1 year ago Vaginal pain   [place] [facility] [place], Dorie Rank, NP       Future Appointments             In 2 weeks Cannady, Dorie Rank, NP  [facility]

## 2023-06-19 ENCOUNTER — Other Ambulatory Visit: Payer: Self-pay | Admitting: Obstetrics and Gynecology

## 2023-06-19 MED ORDER — ZEPBOUND 5 MG/0.5ML ~~LOC~~ SOAJ: 5.0000 mg | SUBCUTANEOUS | 6 refills | Status: DC

## 2023-06-19 MED ORDER — TIRZEPATIDE-WEIGHT MANAGEMENT 2.5 MG/0.5ML ~~LOC~~ SOAJ: 2.5000 mg | SUBCUTANEOUS | 0 refills | Status: DC

## 2023-06-22 ENCOUNTER — Ambulatory Visit (INDEPENDENT_AMBULATORY_CARE_PROVIDER_SITE_OTHER): Payer: Medicaid Other

## 2023-06-22 ENCOUNTER — Other Ambulatory Visit (HOSPITAL_COMMUNITY)
Admission: RE | Admit: 2023-06-22 | Discharge: 2023-06-22 | Disposition: A | Discharge status: Home / Self Care | Payer: Medicaid Other | Source: Ambulatory Visit | Attending: Obstetrics and Gynecology | Admitting: Obstetrics and Gynecology

## 2023-06-22 VITALS — BP 107/78 | HR 87 | Ht 63.0 in | Wt 170.4 lb

## 2023-06-22 DIAGNOSIS — N898 Other specified noninflammatory disorders of vagina: Secondary | ICD-10-CM | POA: Insufficient documentation

## 2023-06-22 NOTE — Telephone Encounter (Signed)
Rx changed to Zepbound.

## 2023-06-22 NOTE — Progress Notes (Signed)
    NURSE VISIT NOTE  Subjective:    Patient ID: Danielle Gillespie, female    DOB: 11-20-1991, 32 y.o.   MRN: [id number]  HPI  Patient is a 32 y.o. G34P1001 female who presents for yellowish vaginal discharge for 1 week(s). Denies abnormal vaginal bleeding or significant pelvic pain or fever. denies dysuria, urinary frequency, urinary urgency, flank pain, abdominal pain, pelvic pain, and cloudy malordorous urine. Patient denies history of known exposure to STD. She complains of vaginal itching, soreness and yellowish discharge.    Objective:    BP 107/78   Pulse 87   Ht 5\' 3"  (1.6 m)   Wt 170 lb 6.4 oz (77.3 kg)   SpO2 (!) 16%   BMI 30.19 kg/m    @THIS  VISIT ONLY@  Assessment:   1. Vaginal discharge     rule out GC or chlamydia and nonspecific vaginitis  Plan:   GC and chlamydia DNA  probe sent to lab. Treatment: abstain from coitus during course of treatment and watch mychart for results and treatment if needed ROV prn if symptoms persist or worsen.    Santiago Bumpers, CMA [facility]

## 2023-06-22 NOTE — Patient Instructions (Signed)
Cervicitis  Cervicitis is when there is irritation and swelling of the cervix. The cervix is the part of the uterus that opens up to the vagina. Sudden (acute) cervicitis may be caused by an infection. Long-term (chronic) cervicitis may be caused by things that irritate the cervix. What are the causes? Sudden cervicitis may be caused by: A sexually transmitted infection (STI). Too much bacteria in the vagina. A germ. Sometimes, the type of germ is not known. Long-term cervicitis may be caused by: Things that are put in the vagina and left for too long. An allergy. An injury to the cervix. Radiation therapy. What increases the risk? These things may make you more likely to develop sudden cervicitis: Unprotected sex. Sex with many partners. These things may make you more likely to develop long-term cervicitis: Using douches. An allergy to latex condoms. Using creams in the vagina that kill sperm. What are the signs or symptoms? Discharge from the vagina that: Smells bad. Is gray, white, or yellow. Pain or itching around the vagina. Pain in the lower stomach or back that happens during or after sex. Peeing (urinating) often. Pain while peeing. Bleeding from the vagina that is not normal. This includes bleeding: Between periods. After sex. After menopause. In some cases, there are no symptoms. How is this treated? Taking medicines. This may include antibiotics or antivirals. Your sex partner may also need to take them. All sex partners in the past 60 days should be seen by a doctor. Stopping the use of tampons or other things. Follow these instructions at home: Medicines Take over-the-counter and prescription medicines only as told by your doctor. If you were prescribed antibiotics, take them as told by your doctor. Do not stop taking them even if you start to feel better. General instructions Do not have sex until your doctor says it is okay. When your doctor says it is okay  to have sex: Use a condom every time you have sex. Limit the number of sex partners. Contact a doctor if: Your symptoms come back or get worse. You have chills or a fever. You feel very tired. Your belly hurts. Your back hurts. You feel like you are going to vomit or you vomit. You have watery poop (diarrhea). Get help right away if: You have very bad pain in your belly and medicine does not help it. You cannot pee. This information is not intended to replace advice given to you by your health care provider. Make sure you discuss any questions you have with your health care provider. Document Revised: 08/28/2022 Document Reviewed: 08/28/2022 Elsevier Patient Education  2024 [employer] Syphilis Syphilis is a curable infection that can spread through sexual contact. It is important to get treatment right away to reduce the possibility of serious complications. There are four stages of syphilis: Primary stage. During this stage, sores may form where the disease entered your body. Secondary stage. During this stage, skin rashes and lesions will form. Latent stage. During this stage, there are no symptoms, but the infection may still be contagious. Tertiary stage. This stage happens 5-30 years after the infection starts. During this stage, the disease damages organs and can lead to death. Most people with treated syphilis do not develop this stage. What are the causes? This condition is caused by bacteria called Treponema pallidum. The condition can spread during sexual activity, such as during oral, anal, or vaginal sex. It can also be spread to an unborn baby (fetus) during pregnancy. What increases the risk?  You are more likely to develop this condition if: You do not use a condom during sex. You have sex with a partner who has syphilis. You have a history of sexually transmitted infections (STIs). You use recreational drugs such as methamphetamines, injection drugs, or heroin. What  are the signs or symptoms? Symptoms of this condition depend on the stage of the disease. Primary stage One or more painless sores (chancres) in and around the genital organs, mouth, or hands. The sores are usually firm and round. Secondary stage Skin rashes or sores (or both) in the mouth, vagina, anus, palms of the hands, or bottoms of the feet. The rash may be rough, red or reddish-brown, and may not itch. Other symptoms may include: A fever. Swollen lymph glands. A sore throat. Patchy hair loss. A headache. A feeling of being ill or very tired. Weight loss. Latent stage There are no symptoms during this stage. Tertiary stage This stage is rarely noted due to the use of antibiotic medicine to treat syphilis. However, without treatment, syphilis can further affect different organ systems, especially the heart and nervous system. Signs and symptoms impacting the heart include: Enlargement of the heart. Damage to the aorta. Narrowing of the blood vessels around the heart. Signs and symptoms impacting the nervous system include: Meningitis. Dysfunction of the nerves to the head. Nonspecific growths may also occur in the skin, mucous membranes, bones, or body organs in this stage. Any stage if there is no treatment Signs and symptoms of impacting the nervous system include: A severe headache. Muscle weakness or difficulty coordinating movements, such as walking. Changes in mental state, such as confusion, personality changes, or dementia. Signs and symptoms of impacting the eyes include: Eye pain. Eye redness. Changes in vision. Blindness. Signs and symptoms of impacting the ears include: Hearing loss. Ringing in the ears (tinnitus). Dizziness or feeling like you or your surroundings are moving or spinning (vertigo). How is this diagnosed? This condition is diagnosed with: A physical exam. Blood tests, including: Nontreponemal test. This is usually the first test. It can  detect other kinds of antibodies as well. Treponemal test. This test is done if you get a positive result on the nontreponemal test. It specifically looks for antibodies to syphilis. This test will not show whether antibodies are from a past syphilis infection or a current infection. Tests of the fluid (drainage) from a sore or rash. Tests of the fluid around the spine (lumbar puncture). These tests are done to check for an infection in the brain or nervous system in late-stage syphilis. Imaging tests. These may be done to check for damage to the heart, aorta, or brain if the condition is in the tertiary stage. Tests may include: X-ray. CT scan. MRI. Echocardiogram. This test takes a picture of the heart. How is this treated? This condition can be cured with antibiotic medicine. It is important to receive treatment as soon as possible to get rid of the infection. However, the treatment may not undo any damage already caused by the infection. Follow these instructions at home: Medicines  Take over-the-counter and prescription medicines only as told by your health care provider. Take your antibiotic medicine as told by your health care provider. Do not stop taking the antibiotic even if you start to feel better. Incomplete treatment will put you at risk for continued infection and could be life-threatening. General instructions Do not have sex until your treatment is completed, or as directed by your health care provider. Tell your  recent sexual partners that you were diagnosed with syphilis. It is important that they get treatment, even if they do not have symptoms. Keep all follow-up visits. This is important. How is this prevented? Use latex or polyurethane condoms correctly whenever you have sex. Before you have sex, ask your partner if they have been tested for STIs. Ask about the test results. Avoid having multiple sexual partners. Contact a health care provider if: You continue to have  any of the following symptoms 24 hours after beginning treatment: Fever. Chills. Headache. Nausea. Aching all over your body. Your symptoms do not improve, even with treatment. Get help right away if: You have severe chest pain. You have trouble walking or coordinating movements. You are confused. You lose vision or hearing. You have numbness in your arms or legs. You have a seizure. You faint. You have a severe headache that does not go away with medicine. These symptoms may be an emergency. Get help right away. Call 911. Do not wait to see if the symptoms will go away. Do not drive yourself to the hospital. Summary Syphilis is an infection that can spread through sexual contact or to a fetus during pregnancy. This condition can cause serious complications, so it is best to get treatment right away. The condition can be cured with antibiotic medicine. Take your antibiotic medicine as told by your health care provider. Tell your recent sexual partners that you were diagnosed with syphilis. It is important that they get treatment, even if they do not have symptoms. This information is not intended to replace advice given to you by your health care provider. Make sure you discuss any questions you have with your health care provider. Document Revised: 10/14/2021 Document Reviewed: 10/14/2021 Elsevier Patient Education  2024 [employer] HPV and Cancer Information Human papillomavirus (HPV) is a common virus. There are more than 100 types of HPV. Most cases of HPV infections go away on their own within 2 years, but other HPV infections are considered high-risk and may cause changes in cells that could lead to cancer. You can take steps to avoid HPV infection and to lower your risk of getting cancer. How can HPV affect me? Often, HPV infection does not cause any symptoms. However, HPV can cause warts in the genitals (genital or mucosal HPV)or on the hands or feet (cutaneous or nonmucosal  HPV). It can also cause wart-like bumps in the throat. Certain types of genital HPV can also cause cancer, which may include: Cancer of the cervix. Cancer of the vagina. Cancer of the outer female genital area (vulva). Cancer of the anus. Cancer of the tongue, tonsils, and throat. Cancer of the penis. How does HPV spread? HPV spreads easily through direct person to person contact. Genital HPV spreads through sexual contact. You can get HPV from vaginal sex, oral sex, anal sex, or just by touching someone's genitals. Even people who have only one sexual partner may have HPV because that partner may have it. HPV often does not cause symptoms, so most infected people do not know that they have it. What actions can I take to prevent HPV?     Take the following steps to help prevent HPV infection: Talk with your health care provider about getting the HPV vaccine. This vaccine protects against the types of HPV that could cause cancer. Limit the number of people you have sex with. Also, avoid having sex with people who have had many sexual partners. Use a new latex or polyurethane condom  for each sex act. Talk with your sexual partners about their health. What actions can I take to lower my risk for cancer? Having a healthy lifestyle and taking some preventive steps can help lower your cancer risk, whether or not you have genital HPV. Some steps you can take include: Lifestyle Do not use any products that contain nicotine or tobacco. These products include cigarettes, chewing tobacco, and vaping devices, such as e-cigarettes. If you need help quitting, ask your provider. Eat healthy foods such as fruits, vegetables, and grains. Try to eat at least 5 servings of fruits and vegetables every day. Get regular exercise. Lose weight if you are overweight. Practice good oral hygiene. This includes flossing and brushing your teeth every day. Other preventive steps Get the HPV vaccine as told by your  provider. Practice safe sex. Use a protective barrier, such as a condom, every time you have sex. Get tested for sexually transmitted infections (STIs) even if you do not have symptoms of HPV. You may have HPV and not know it. If you are female, get regular Pap and HPV tests. Talk with your provider about how often you need these tests. Pap tests will help identify changes in cells that can lead to cancer. HPV tests will help detect if there is HPV infection in the cells taken from the cervix. Where to find more information Centers for Disease Control and Prevention: TonerPromos.no National Cancer Institute: cancer.gov American Cancer Society: cancer.org This information is not intended to replace advice given to you by your health care provider. Make sure you discuss any questions you have with your health care provider. Document Revised: 09/08/2022 Document Reviewed: 07/28/2022 Elsevier Patient Education  2024 [employer] Gonorrhea Gonorrhea is a sexually transmitted infection (STI) that can infect any person. If left untreated, this infection can: Damage the reproductive organs. Spread to other parts of the body. Cause someone to be unable to have children (infertility). Harm an unborn baby if an infected person is pregnant. It is important to get treatment for gonorrhea as soon as possible. All of your sex partners may also need to be treated for the infection. What are the causes? This condition is caused by bacteria called Neisseria gonorrhoeae. The infection is spread from person to person through sexual contact, including oral, anal, and vaginal sex. The infection can also pass from a pregnant person to the baby during birth. What increases the risk? The following factors may make you more likely to develop this condition: Being a woman younger than 25 years and sexually active. Being a man who has sex with men. Having a new sex partner or having multiple partners. Having a sex partner  who has an STI. Not using condoms correctly or not using condoms every time you have sex. Having a history of STIs. What are the signs or symptoms? When symptoms occur, they may include: Abnormal discharge from the penis or vagina. The discharge may be cloudy, thick, or yellow-green in color. Pain or burning when you urinate. Itching, irritation, pain, bleeding, or discharge from the rectum. This may occur if the infection was spread by anal sex. Sore throat or swollen lymph nodes in the neck. This may occur if the infection was spread by oral sex. Pain or swelling in the testicles. Bleeding between menstrual periods. If the infection has spread to other areas of the body, symptoms may include: Fever. Eye irritation. Swelling, redness, warmth, and pain in the joints. Rashes. In some cases, there are no symptoms. How  is this diagnosed? This condition is diagnosed based on: A physical exam. A swab of fluid. The swab of fluid may be taken from the penis, vagina, throat, or rectum. Urine tests. Not all test results will be available during your visit. How is this treated? This condition is treated with antibiotic medicines. It is important to start treatment as soon as possible. Early treatment may prevent some problems from developing. You should not have sex during treatment. All types of sexual activity should be avoided for at least 7 days after treatment is complete and until your sex partner or partners have been treated. Follow these instructions at home: Take over-the-counter and prescription medicines as told by your health care provider. Finish all antibiotic medicine even when you start to feel better. Do not have sex during treatment. Do not have sex until at least 7 days after you and your partner or partners have finished treatment and your health care provider says it is okay. It is up to you to get your test results. Ask your health care provider, or the department that is  doing the test, when your results will be ready. If you get a positive result on your gonorrhea test, tell your recent sex partners. These include any partners for oral, anal, or vaginal sex. They need to be checked for gonorrhea even if they do not have symptoms. They may need treatment, even if they get negative results on their gonorrhea tests. Keep all follow-up visits. This is important. How is this prevented?  Use latex or polyurethane condoms correctly every time you have sex. Ask if your sex partner or partners have been tested for STIs and had negative results. Avoid having multiple sex partners. Get regular health screenings to check for STIs. Contact a health care provider if: Your symptoms do not get better after a few days of taking antibiotics. Your symptoms get worse. You cannot take your medicine as directed by your health care provider. You develop new symptoms, including: Eye irritation. Swelling, redness, warmth, and pain in the joints. Rashes. You have a fever. Summary Gonorrhea is a sexually transmitted infection (STI) that can infect any person. This infection is spread from person to person through sexual contact, including oral, anal, and vaginal sex. The infection can also pass from a pregnant person to the baby during birth. Symptoms include abnormal discharge, pain or burning while urinating, or pain in the rectum. This condition is treated with antibiotic medicines. Do not have sex until at least 7 days after both you and any sex partners have completed antibiotic treatment. Tell your health care provider if you have trouble taking your medicine, your symptoms get worse, or you have new symptoms. Keep all follow-up visits. This information is not intended to replace advice given to you by your health care provider. Make sure you discuss any questions you have with your health care provider. Document Revised: 10/13/2021 Document Reviewed: 10/13/2021 Elsevier  Patient Education  2024 [employer] Inc. Genital Warts  Genital warts are a common sexually transmitted infection (STI). They can be easily passed from person to person during sex. They look like small warts near the genitals or the opening of the butt (anus). What are the causes? Genital warts are caused by a virus called human papillomavirus (HPV). You may get HPV if you have sex without a condom with someone who has HPV. What increases the risk? Having sex without using a condom. Having sex with many people. Being a female who is not  circumcised. Having sex with a female who is not circumcised. Having a weak body defense system (immune system). What are the signs or symptoms? Small warts near your genitals or butt. These may be: Different sizes and shapes. Flat, round, or look like a cauliflower. The same color as your skin. Tell your doctor if you have: Itching, bleeding, or pain on the skin near your genitals, groin, or butt. Genital warts that look odd, turn into open sores, or change in color. Pain during sex. In many cases, you may not have any symptoms. How is this treated? Medicines, such as creams, that you put on your skin. Procedures to: Freeze the warts. Burn the warts. Surgery to get rid of the warts. If you do not get treatment, the warts may go away, stay the same, or grow in size and number. Follow these instructions at home: Medicines  Apply over-the-counter and prescription medicines only as told by your doctor. Do not use medicines that are meant for treating hand or foot warts. Talk with your doctor about using creams to treat itching. General instructions Do not touch or scratch the warts. Tell your current and past sex partners that you have genital warts. They may need treatment. If you are female, get a Pap and HPV test as often as told by your doctor. If you get pregnant, tell your doctor that you have had genital warts. Keep all follow-up visits. Your  doctor will watch you closely. Some types of HPV make you more likely to get cancer. How is this prevented? To prevent genital warts: Use a new condom each time you have sex. Limit how many people you have sex with. Have a sex partner who does not have other sex partners. Talk with your sex partners about their health. Get the HPV shot. This protects you against the types of HPV that could cause cancer. Where to find more information Centers for Disease Control and Prevention (CDC): TonerPromos.no Contact a doctor if: You have redness, swelling, or pain where your skin is being treated for the warts. You have pain or itching in your groin, genitals, or anus. You feel lumps in or around your genitals or butt. You have bleeding near your genitals or butt. You have pain during sex, or you bleed after sex. This information is not intended to replace advice given to you by your health care provider. Make sure you discuss any questions you have with your health care provider. Document Revised: 07/12/2022 Document Reviewed: 07/12/2022 Elsevier Patient Education  2024 [employer] Inc. Chlamydia, Female Chlamydia is a sexually transmitted infection (STI). This infection spreads through sexual contact. The infection can grow in: The urethra. This is the part of the body that drains pee (urine) from the bladder. The cervix. This is the lowest part of the womb (uterus). The throat. The opening of the butt (rectum). This condition is not hard to treat. But if it is not treated, it can cause worse health problems. You may have a higher risk of not being able to have children. Also, if you are pregnant or get pregnant and have untreated chlamydia: It can cause serious problems during pregnancy. It can spread to your baby during delivery and cause your baby to have health problems. What are the causes?  This condition is caused by a germ (bacteria) called Chlamydia trachomatis. These germs are spread from an  infected partner during sex. The infection can spread through contact with the genitals, mouth, or the opening of the butt (  rectum). What increases the risk? Not using a condom the right way. Not using a condom every time you have sex. Having a new sex partner. Having more than one sex partner. Being sexually active before age 71. What are the signs or symptoms? In some cases, there are no symptoms, especially early in the illness. If you get symptoms, they may include: Peeing often, or a burning feeling when you pee. Redness, soreness, or swelling of the vagina or butt. Fluid (discharge) coming from the vagina or butt. Pain the belly (abdomen). Pain during sex. Bleeding between monthly periods or irregular periods. How is this treated? This condition is treated with antibiotic medicines. Follow these instructions at home: Sexual activity Tell your sex partner or partners about your infection. Sex partners are people you had oral, anal, or vaginal sex with within 60 days of when you started getting sick. They need treatment even if they do not feel or seem sick. Do not have sex until: You and your sex partners have been treated. Your doctor says it is okay. If you get just one dose of medicine, wait at least 7 days before having sex. General instructions Take over-the-counter and prescription medicines as told by your doctor. Finish your antibiotics even if you start to feel better. It is up to you to get your test results. Ask how to get your results when they are ready. Keep all follow-up visits. You may need tests after 3 months. How is this prevented? To lower your risk: Use latex or polyurethane condoms the right way. Do this every time you have sex. Do not have many sex partners. Ask if your sex partner got tested for STIs and had negative results. Get regular health screenings to check for STIs. Contact a doctor if: You get new symptoms. Your symptoms are getting worse or  do not get better with treatment. You have a fever or chills. You have pain during sex. Your periods are irregular. You bleed between periods or after sex. You get flu-like symptoms. These may be: Night sweats. Sore throat. Muscle aches. You are unable to take your antibiotic medicine as prescribed. Summary Chlamydia is an infection that spreads through sexual contact. This condition is treated with antibiotics. If it is not treated, it can cause health problems. Your sex partners will also need to be treated. Do not have sex until both you and your partner have been treated. Take all medicines as told and keep all follow-up visits. This information is not intended to replace advice given to you by your health care provider. Make sure you discuss any questions you have with your health care provider. Document Revised: 08/29/2021 Document Reviewed: 08/29/2021 Elsevier Patient Education  2024 [employer].

## 2023-06-25 LAB — CERVICOVAGINAL ANCILLARY ONLY
Bacterial Vaginitis (gardnerella): NEGATIVE
Candida Glabrata: NEGATIVE
Candida Vaginitis: NEGATIVE
Chlamydia: NEGATIVE
Comment: NEGATIVE
Comment: NEGATIVE
Comment: NEGATIVE
Comment: NEGATIVE
Comment: NEGATIVE
Comment: NORMAL
Neisseria Gonorrhea: NEGATIVE
Trichomonas: POSITIVE — AB

## 2023-06-26 ENCOUNTER — Other Ambulatory Visit: Payer: Self-pay | Admitting: Obstetrics and Gynecology

## 2023-06-26 ENCOUNTER — Encounter: Payer: Self-pay | Admitting: Obstetrics and Gynecology

## 2023-06-26 DIAGNOSIS — A599 Trichomoniasis, unspecified: Secondary | ICD-10-CM

## 2023-06-26 MED ORDER — METRONIDAZOLE 500 MG PO TABS: ORAL_TABLET | ORAL | 1 refills | Status: DC

## 2023-06-26 NOTE — Telephone Encounter (Signed)
No it cannot be spread through towels.  I have included information on the infection in a separate message, perhaps you can share this info with your partner, or can direct him to the [facility] website regarding transmission of the infection.

## 2023-06-27 ENCOUNTER — Ambulatory Visit: Payer: Medicaid Other | Admitting: Nurse Practitioner

## 2023-06-27 ENCOUNTER — Telehealth (INDEPENDENT_AMBULATORY_CARE_PROVIDER_SITE_OTHER): Payer: Medicaid Other | Admitting: Nurse Practitioner

## 2023-06-27 ENCOUNTER — Encounter: Payer: Self-pay | Admitting: Nurse Practitioner

## 2023-06-27 DIAGNOSIS — F4321 Adjustment disorder with depressed mood: Secondary | ICD-10-CM

## 2023-06-27 MED ORDER — FLUOXETINE HCL 10 MG PO CAPS: 10.0000 mg | ORAL_CAPSULE | Freq: Every day | ORAL | 2 refills | Status: DC

## 2023-06-27 NOTE — Patient Instructions (Signed)
Managing Loss, Adult People experience loss in many different ways throughout their lives. Events such as moving, changing jobs, and losing friends can create a sense of loss. The loss may be as serious as a major health change, divorce, death of a pet, or death of a loved one. All of these types of loss are likely to create a physical and emotional reaction known as grief. Grief is the result of a major change or an absence of something or someone that you count on. Grief is a normal reaction to loss. A variety of factors can affect your grieving experience, including: The nature of your loss. Your relationship to what or whom you lost. Your understanding of grief and how to manage it. Your support system. Be aware that when grief becomes extreme, it can lead to more severe issues like isolation, depression, anxiety, or suicidal thoughts. Talk with your health care provider if you have any of these issues. How to manage lifestyle changes Keep to your normal routine as much as possible. If you have trouble focusing or doing normal activities, it is acceptable to take some time away from your normal routine. Spend time with friends and loved ones. Eat a healthy diet, get plenty of sleep, and rest when you feel tired. How to recognize changes  The way that you deal with your grief will affect your ability to function as you normally do. When grieving, you may experience these changes: Numbness, shock, sadness, anxiety, anger, denial, and guilt. Thoughts about death. Unexpected crying. A physical sensation of emptiness in your stomach. Problems sleeping and eating. Tiredness (fatigue). Loss of interest in normal activities. Dreaming about or imagining seeing the person who died. A need to remember what or whom you lost. Difficulty thinking about anything other than your loss for a period of time. Relief. If you have been expecting the loss for a while, you may feel a sense of relief when it  happens. Follow these instructions at home: Activity Express your feelings in healthy ways, such as: Talking with others about your loss. It may be helpful to find others who have had a similar loss, such as a support group. Writing down your feelings in a journal. Doing physical activities to release stress and emotional energy. Doing creative activities like painting, sculpting, or playing or listening to music. Practicing resilience. This is the ability to recover and adjust after facing challenges. Reading some resources that encourage resilience may help you to learn ways to practice those behaviors.  General instructions Be patient with yourself and others. Allow the grieving process to happen, and remember that grieving takes time. It is likely that you may never feel completely done with some grief. You may find a way to move on while still cherishing memories and feelings about your loss. Accepting your loss is a process. It can take months or longer to adjust. Keep all follow-up visits. This is important. Where to find support To get support for managing loss: Ask your health care provider for help and recommendations, such as grief counseling or therapy. Think about joining a support group for people who are managing a loss. Where to find more information You can find more information about managing loss from: American Society of Clinical Oncology: www.cancer.net American Psychological Association: www.apa.org Contact a health care provider if: Your grief is extreme and keeps getting worse. You have ongoing grief that does not improve. Your body shows symptoms of grief, such as illness. You feel depressed, anxious, or   hopeless. Get help right away if: You have thoughts about hurting yourself or others. Get help right away if you feel like you may hurt yourself or others, or have thoughts about taking your own life. Go to your nearest emergency room or: Call 911. Call the  National Suicide Prevention Lifeline at 1-800-273-8255 or 988. This is open 24 hours a day. Text the Crisis Text Line at 741741. Summary Grief is the result of a major change or an absence of someone or something that you count on. Grief is a normal reaction to loss. The depth of grief and the period of recovery depend on the type of loss and your ability to adjust to the change and process your feelings. Processing grief requires patience and a willingness to accept your feelings and talk about your loss with people who are supportive. It is important to find resources that work for you and to realize that people experience grief differently. There is not one grieving process that works for everyone in the same way. Be aware that when grief becomes extreme, it can lead to more severe issues like isolation, depression, anxiety, or suicidal thoughts. Talk with your health care provider if you have any of these issues. This information is not intended to replace advice given to you by your health care provider. Make sure you discuss any questions you have with your health care provider. Document Revised: 07/11/2021 Document Reviewed: 07/11/2021 Elsevier Patient Education  2024 Elsevier Inc.  

## 2023-06-27 NOTE — Assessment & Plan Note (Signed)
Ongoing since loss of brother.  Denies SI/HI. Discussed with patient, scores and mood are improving.  Continue Prozac 10 MG daily, which offers benefit to mood and energy, and continue Vistaril 25 MG PRN for increased anxiety (recommend she try at night for sleep).  Educated her on medications and use + BLACK BOX warning.  Recommend she take consistently + recommend she look into grief support group via hospice.  Return in 6 months.

## 2023-06-27 NOTE — Progress Notes (Signed)
There were no vitals taken for this visit.   Subjective:    Patient ID: Danielle Gillespie, female    DOB: 22-Jan-1991, 32 y.o.   MRN: [id number]  HPI: Danielle Gillespie is a 32 y.o. female  Chief Complaint  Patient presents with   Grief    6 week f/up   This visit was completed via video visit through MyChart due to the restrictions of the COVID-19 pandemic. All issues as above were discussed and addressed. Physical exam was done as above through visual confirmation on video through MyChart. If it was felt that the patient should be evaluated in the office, they were directed there. The patient verbally consented to this visit. Location of the patient: home Location of the provider: work Those involved with this call:  Provider: Aura Dials, DNP CMA: Wilhemena Durie, CMA Front Desk/Registration: Kandice Hams  Time spent on call:  21 minutes with patient face to face via video conference. More than 50% of this time was spent in counseling and coordination of care. 15 minutes total spent in review of patient's record and preparation of their chart.  I verified patient identity using two factors (patient name and date of birth). Patient consents verbally to being seen via telemedicine visit today.    DEPRESSION Follow-up today for starting Prozac on 05/16/23, started with 10 MG dosing and added on Vistaril as needed.  Recent loss of brother.  She feels she is doing a little better.  Less depressed mood, was every day and now comes and goes.  Overall she feels she is doing well on current medication dosing. Mood status: stable Satisfied with current treatment?: yes Symptom severity: mild  Duration of current treatment : chronic Side effects: no Medication compliance: good compliance Psychotherapy/counseling: none Previous psychiatric medications: unknown Depressed mood: yes Anxious mood:  sometimes Anhedonia: no Significant weight loss or gain: no Insomnia: yes hard to fall asleep --  when does fall asleep she sleeps well Fatigue: yes Feelings of worthlessness or guilt: no Impaired concentration/indecisiveness: no Suicidal ideations: no Hopelessness: no Crying spells: yes x 1 in past couple    06/27/2023    4:09 PM 05/16/2023    2:26 PM 10/04/2022    8:30 AM 09/05/2022   10:57 AM 05/02/2022    2:17 PM  Depression screen PHQ 2/9  Decreased Interest 0 0 0 0 0  Down, Depressed, Hopeless 2 0 0 0 0  PHQ - 2 Score 2 0 0 0 0  Altered sleeping 2 3 0  0  Tired, decreased energy 3 3 0  0  Change in appetite 0 0 0  0  Feeling bad or failure about yourself  1 1 0  0  Trouble concentrating 0 0 0  0  Moving slowly or fidgety/restless 0 0 0  0  Suicidal thoughts 0 0 0  0  PHQ-9 Score 8 7 0  0  Difficult doing work/chores Not difficult at all Somewhat difficult Not difficult at all  Not difficult at all       06/27/2023    4:11 PM 05/16/2023    2:26 PM 10/04/2022    8:31 AM 05/02/2022    2:17 PM  GAD 7 : Generalized Anxiety Score  Nervous, Anxious, on Edge 1 3 0 0  Control/stop worrying 1 3 0 0  Worry too much - different things 2 3 0 0  Trouble relaxing 3 2 0 0  Restless 0 1 0 0  Easily annoyed or irritable 1  1 0 0  Afraid - awful might happen 1 2 0 0  Total GAD 7 Score 9 15 0 0  Anxiety Difficulty Somewhat difficult Somewhat difficult Not difficult at all Not difficult at all   Relevant past medical, surgical, family and social history reviewed and updated as indicated. Interim medical history since our last visit reviewed. Allergies and medications reviewed and updated.  Review of Systems  Constitutional:  Negative for activity change, appetite change, diaphoresis, fatigue and fever.  Respiratory:  Negative for cough, chest tightness and shortness of breath.   Cardiovascular:  Negative for chest pain, palpitations and leg swelling.  Neurological: Negative.   Psychiatric/Behavioral:  Positive for sleep disturbance. Negative for decreased concentration, self-injury  and suicidal ideas. The patient is nervous/anxious.     Per HPI unless specifically indicated above     Objective:    There were no vitals taken for this visit.  Wt Readings from Last 3 Encounters:  06/22/23 170 lb 6.4 oz (77.3 kg)  05/16/23 162 lb (73.5 kg)  12/12/22 174 lb 12.8 oz (79.3 kg)    Physical Exam Vitals and nursing note reviewed.  Constitutional:      General: She is awake. She is not in acute distress.    Appearance: She is well-developed and well-groomed. She is obese. She is not ill-appearing.  HENT:     Head: Normocephalic.     Right Ear: Hearing normal.     Left Ear: Hearing normal.  Eyes:     General: Lids are normal.        Right eye: No discharge.        Left eye: No discharge.     Conjunctiva/sclera: Conjunctivae normal.  Pulmonary:     Effort: Pulmonary effort is normal. No accessory muscle usage or respiratory distress.  Musculoskeletal:     Cervical back: Normal range of motion.  Neurological:     Mental Status: She is alert and oriented to person, place, and time.  Psychiatric:        Attention and Perception: Attention normal.        Mood and Affect: Mood normal.        Behavior: Behavior normal. Behavior is cooperative.        Thought Content: Thought content normal.        Judgment: Judgment normal.     Results for orders placed or performed in visit on 06/22/23  Cervicovaginal ancillary only  Result Value Ref Range   Neisseria Gonorrhea Negative    Chlamydia Negative    Trichomonas Positive (A)    Bacterial Vaginitis (gardnerella) Negative    Candida Vaginitis Negative    Candida Glabrata Negative    Comment Normal Reference Range Candida Species - Negative    Comment Normal Reference Range Candida Galbrata - Negative    Comment Normal Reference Range Trichomonas - Negative    Comment Normal Reference Ranger Chlamydia - Negative    Comment      Normal Reference Range Neisseria Gonorrhea - Negative   Comment      Normal Reference  Range Bacterial Vaginosis - Negative      Assessment & Plan:   Problem List Items Addressed This Visit       Other   Grief reaction - Primary    Ongoing since loss of brother.  Denies SI/HI. Discussed with patient, scores and mood are improving.  Continue Prozac 10 MG daily, which offers benefit to mood and energy, and continue Vistaril 25 MG PRN  for increased anxiety (recommend she try at night for sleep).  Educated her on medications and use + BLACK BOX warning.  Recommend she take consistently + recommend she look into grief support group via hospice.  Return in 6 months.       I discussed the assessment and treatment plan with the patient. The patient was provided an opportunity to ask questions and all were answered. The patient agreed with the plan and demonstrated an understanding of the instructions.   The patient was advised to call back or seek an in-person evaluation if the symptoms worsen or if the condition fails to improve as anticipated.   I provided 21+ minutes of time during this encounter.    Follow up plan: Return in about 6 months (around 12/28/2023) for MOOD, B12, Vit D.

## 2023-06-28 NOTE — Progress Notes (Signed)
Appointment has been made

## 2023-07-05 ENCOUNTER — Other Ambulatory Visit: Payer: Self-pay

## 2023-07-06 MED ORDER — ZEPBOUND 2.5 MG/0.5ML ~~LOC~~ SOAJ: 2.5000 mg | SUBCUTANEOUS | 6 refills | Status: AC

## 2023-07-06 NOTE — Progress Notes (Signed)
Completed.

## 2023-07-11 ENCOUNTER — Telehealth: Payer: Self-pay

## 2023-07-11 NOTE — Telephone Encounter (Signed)
PA has been sent to plan. 

## 2023-07-19 ENCOUNTER — Encounter: Payer: Self-pay | Admitting: Obstetrics and Gynecology

## 2023-07-19 MED ORDER — SEMAGLUTIDE-WEIGHT MANAGEMENT 0.5 MG/0.5ML ~~LOC~~ SOAJ: 0.5000 mg | SUBCUTANEOUS | 0 refills | Status: AC

## 2023-07-19 MED ORDER — SEMAGLUTIDE-WEIGHT MANAGEMENT 1.7 MG/0.75ML ~~LOC~~ SOAJ: 1.7000 mg | SUBCUTANEOUS | 0 refills | Status: AC

## 2023-07-19 MED ORDER — SEMAGLUTIDE-WEIGHT MANAGEMENT 0.25 MG/0.5ML ~~LOC~~ SOAJ: 0.2500 mg | SUBCUTANEOUS | 0 refills | Status: AC

## 2023-07-19 MED ORDER — SEMAGLUTIDE-WEIGHT MANAGEMENT 2.4 MG/0.75ML ~~LOC~~ SOAJ: 2.4000 mg | SUBCUTANEOUS | 6 refills | Status: AC

## 2023-07-19 MED ORDER — SEMAGLUTIDE-WEIGHT MANAGEMENT 1 MG/0.5ML ~~LOC~~ SOAJ: 1.0000 mg | SUBCUTANEOUS | 0 refills | Status: AC

## 2023-07-20 ENCOUNTER — Telehealth: Payer: Self-pay

## 2023-07-20 NOTE — Telephone Encounter (Signed)
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 Where did this come from.

## 2023-07-26 NOTE — Telephone Encounter (Signed)
Name: Natusha Alliston MID: [id number] L Decision Date: 07/11/2023 YOU ASKED FOR: [employer] [id number] Medicaid 07/11/2023 2 units WE DENIED: [employer] [id number] Medicaid 07/11/2023 2 units COMMENTS:  Per the health plan preferred drug list, at least 2 preferred drugs must be tried before requesting this drug  or tell us why the member cannot try any preferred alternatives. Please send [place] supporting chart notes and  lab results. Here is list of preferred alternatives: Wegovy.

## 2023-08-10 NOTE — Telephone Encounter (Signed)
APPROVED

## 2023-08-10 NOTE — Telephone Encounter (Signed)
Initiated [place] PA again.

## 2023-09-03 NOTE — Telephone Encounter (Signed)
Sounds good. Thank you

## 2023-09-05 ENCOUNTER — Ambulatory Visit: Payer: Medicaid Other | Admitting: Obstetrics and Gynecology

## 2023-09-05 ENCOUNTER — Encounter: Payer: Self-pay | Admitting: Obstetrics and Gynecology

## 2023-09-05 VITALS — BP 114/64 | HR 82 | Resp 16 | Ht 63.0 in | Wt 179.7 lb

## 2023-09-05 DIAGNOSIS — Z713 Dietary counseling and surveillance: Secondary | ICD-10-CM

## 2023-09-05 DIAGNOSIS — Z3202 Encounter for pregnancy test, result negative: Secondary | ICD-10-CM

## 2023-09-05 DIAGNOSIS — E785 Hyperlipidemia, unspecified: Secondary | ICD-10-CM

## 2023-09-05 DIAGNOSIS — Z7689 Persons encountering health services in other specified circumstances: Secondary | ICD-10-CM

## 2023-09-05 DIAGNOSIS — N912 Amenorrhea, unspecified: Secondary | ICD-10-CM | POA: Diagnosis not present

## 2023-09-05 LAB — POCT URINE PREGNANCY: Preg Test, Ur: NEGATIVE

## 2023-09-05 NOTE — Progress Notes (Signed)
GYNECOLOGY PROGRESS NOTE  Subjective:    Patient ID: Danielle Gillespie, female    DOB: 1991-10-14, 32 y.o.   MRN: [id number]  HPI  Patient is a 32 y.o. G92P1001 female who presents for month weight management follow up. She has a past history of obesity, and mild dyslipidemia. She initiated use of Wegovy 3 weeks ago.  Has had multiple attempts to get different weight loss medications approved and recently was approved for [facility]. Currently on 025 mg dosing, but will be due to increase dosing next week. Has tried Phentermine in the recent past but did not experience significant weight loss despite dietary modification and exercise.  Reports side effects of feeling some weakness while on the medication, but notes she has heard from other persons using medication that this is common and typically will pass.     Current interventions:  1. Diet - She has note been eating a healthy dies since her brother passed. She tries to eat more protein. No appetite til evening and she overeats to make up from breakfast and lunch. 2. Activity - Occasionally exercises at home, riding bicycle for 20 minutes 3. Reports bowel movements are normal    Patient also reports that her cycle has not started yet and desires a pregnancy test.   The following portions of the patient's history were reviewed and updated as appropriate: allergies, current medications, past family history, past medical history, past social history, past surgical history, and problem list.  Review of Systems Pertinent items noted in HPI and remainder of comprehensive ROS otherwise negative.   Objective:       09/05/2023   11:35 AM 06/22/2023    9:18 AM 05/16/2023    2:20 PM  Vitals with BMI  Height 5\' 3"  5\' 3"  5' 2.008"  Weight 179 lbs 11 oz 170 lbs 6 oz 162 lbs  BMI 31.84 30.19 29.62  Systolic 114 107 409  Diastolic 64 78 72  Pulse 82 87 69    General appearance: alert, cooperative, and no distress Abdomen: soft, non-tender.  Waist  circumference 40 in.    Labs:  Results for orders placed or performed in visit on 09/05/23  POCT urine pregnancy  Result Value Ref Range   Preg Test, Ur Negative Negative    Lab Results  Component Value Date   CHOL 194 05/16/2023   HDL 53 05/16/2023   LDLCALC 118 (H) 05/16/2023   TRIG 129 05/16/2023    Lab Results  Component Value Date   TSH 1.280 05/16/2023    Latest Reference Range & Units 05/16/23 14:56  Glucose 70 - 99 mg/dL 91    Assessment:   Weight management Obesity, Body mass index is 31.83 kg/m. Mild dyslipidemia  Amenorrhea  Plan:   Weight management  - recently initiated weight loss with [id number], can continue dose titration until maintenance dose reached. Discussed returning to more healthy lifestyle, advised on methods to manage grief.  Encouraged incorporating protein in early meals (such as bars, shakes, or lean meats) to help curb intense hunger in the evening.  Dyslipidemia - mild, no medication management needed at this time. Will continue to monitor and will likely improve with weight loss.  Amenorrhea, UPT negative.  May be secondary to recent life stressors. Continue to monitor. If no cycle within 3 months, should return for further evaluation.   RTC in 3 months for weight loss follow up.    A total of 15 minutes were spent face-to-face with the patient during  this encounter and over half of that time dealt with counseling and coordination of care.   Hildred Laser, MD [facility]

## 2023-09-05 NOTE — Patient Instructions (Signed)
Obesity, Adult Obesity is the condition of having too much total body fat. Being overweight or obese means that your weight is greater than what is considered healthy for your body size. Obesity is determined by a measurement called BMI (body mass index). BMI is an estimate of body fat and is calculated from height and weight. For adults, a BMI of 30 or higher is considered obese. Obesity can lead to other health concerns and major illnesses, including: Stroke. Coronary artery disease (CAD). Type 2 diabetes. Some types of cancer, including cancers of the colon, breast, uterus, and gallbladder. High blood pressure (hypertension). High cholesterol. Gallbladder stones. Obesity can also contribute to: Osteoarthritis. Sleep apnea. Infertility problems. What are the causes? Common causes of this condition include: Eating daily meals that are high in calories, sugar, and fat. Drinking high amounts of sugar-sweetened beverages, such as soft drinks. Being born with genes that may make you more likely to become obese. Having a medical condition that causes obesity, including: Hypothyroidism. Polycystic ovarian syndrome (PCOS). Binge-eating disorder. Cushing syndrome. Taking certain medicines, such as steroids, antidepressants, and seizure medicines. Not being physically active (sedentary lifestyle). Not getting enough sleep. What increases the risk? The following factors may make you more likely to develop this condition: Having a family history of obesity. Living in an area with limited access to: [place], recreation centers, or sidewalks. Healthy food choices, such as grocery stores and farmers' markets. What are the signs or symptoms? The main sign of this condition is having too much body fat. How is this diagnosed? This condition is diagnosed based on: Your BMI. If you are an adult with a BMI of 30 or higher, you are considered obese. Your waist circumference. This measures the  distance around your waistline. Your skinfold thickness. Your health care provider may gently pinch a fold of your skin and measure it. You may have other tests to check for underlying conditions. How is this treated? Treatment for this condition often includes changing your lifestyle. Treatment may include some or all of the following: Dietary changes. This may include developing a healthy meal plan. Regular physical activity. This may include activity that causes your heart to beat faster (aerobic exercise) and strength training. Work with your health care provider to design an exercise program that works for you. Medicine to help you lose weight if you are unable to lose one pound a week after six weeks of healthy eating and more physical activity. Treating conditions that cause the obesity (underlying conditions). Surgery. Surgical options may include gastric banding and gastric bypass. Surgery may be done if: Other treatments have not helped to improve your condition. You have a BMI of 40 or higher. You have life-threatening health problems related to obesity. Follow these instructions at home: Eating and drinking  Follow recommendations from your health care provider about what you eat and drink. Your health care provider may advise you to: Limit fast food, sweets, and processed snack foods. Choose low-fat options, such as low-fat milk instead of whole milk. Eat five or more servings of fruits or vegetables every day. Choose healthy foods when you eat out. Keep low-fat snacks available. Limit sugary drinks, such as soda, fruit juice, sweetened iced tea, and flavored milk. Drink enough water to keep your urine pale yellow. Do not follow a fad diet. Fad diets can be unhealthy and even dangerous. Other healthful choices include: Eat at home more often. This gives you more control over what you eat. Learn to read food labels.  This will help you understand how much food is considered one  serving. Learn what a healthy serving size is. Physical activity Exercise regularly, as told by your health care provider. Most adults should get up to 150 minutes of moderate-intensity exercise every week. Ask your health care provider what types of exercise are safe for you and how often you should exercise. Warm up and stretch before being active. Cool down and stretch after being active. Rest between periods of activity. Lifestyle Work with your health care provider and a dietitian to set a weight-loss goal that is healthy and reasonable for you. Limit your screen time. Find ways to reward yourself that do not involve food. Do not drink alcohol if: Your health care provider tells you not to drink. You are pregnant, may be pregnant, or are planning to become pregnant. If you drink alcohol: Limit how much you have to: 0-1 drink a day for women. 0-2 drinks a day for men. Know how much alcohol is in your drink. In the [place] one drink equals one 12 oz bottle of beer (355 mL), one 5 oz glass of wine (148 mL), or one 1 oz glass of hard liquor (44 mL). General instructions Keep a weight-loss journal to keep track of the food you eat and how much exercise you get. Take over-the-counter and prescription medicines only as told by your health care provider. Take vitamins and supplements only as told by your health care provider. Consider joining a support group. Your health care provider may be able to recommend a support group. Pay attention to your mental health as obesity can lead to depression or self esteem issues. Keep all follow-up visits. This is important. Contact a health care provider if: You are unable to meet your weight-loss goal after six weeks of dietary and lifestyle changes. You have trouble breathing. Summary Obesity is the condition of having too much total body fat. Being overweight or obese means that your weight is greater than what is considered healthy for your body  size. Work with your health care provider and a dietitian to set a weight-loss goal that is healthy and reasonable for you. Exercise regularly, as told by your health care provider. Ask your health care provider what types of exercise are safe for you and how often you should exercise. This information is not intended to replace advice given to you by your health care provider. Make sure you discuss any questions you have with your health care provider. Document Revised: 06/28/2021 Document Reviewed: 06/28/2021 Elsevier Patient Education  2024 [employer]. Exercising to Lose Weight Getting regular exercise is important for everyone. It is especially important if you are overweight. Being overweight increases your risk of heart disease, stroke, diabetes, high blood pressure, and several types of cancer. Exercising, and reducing the calories you consume, can help you lose weight and improve fitness and health. Exercise can be moderate or vigorous intensity. To lose weight, most people need to do a certain amount of moderate or vigorous-intensity exercise each week. How can exercise affect me? You lose weight when you exercise enough to burn more calories than you eat. Exercise also reduces body fat and builds muscle. The more muscle you have, the more calories you burn. Exercise also: Improves mood. Reduces stress and tension. Improves your overall fitness, flexibility, and endurance. Increases bone strength. Moderate-intensity exercise  Moderate-intensity exercise is any activity that gets you moving enough to burn at least three times more energy (calories) than if you were  sitting. Examples of moderate exercise include: Walking a mile in 15 minutes. Doing light yard work. Biking at an easy pace. Most people should get at least 150 minutes of moderate-intensity exercise a week to maintain their body weight. Vigorous-intensity exercise Vigorous-intensity exercise is any activity that gets  you moving enough to burn at least six times more calories than if you were sitting. When you exercise at this intensity, you should be working hard enough that you are not able to carry on a conversation. Examples of vigorous exercise include: Running. Playing a team sport, such as football, basketball, and soccer. Jumping rope. Most people should get at least 75 minutes a week of vigorous exercise to maintain their body weight. What actions can I take to lose weight? The amount of exercise you need to lose weight depends on: Your age. The type of exercise. Any health conditions you have. Your overall physical ability. Talk to your health care provider about how much exercise you need and what types of activities are safe for you. Nutrition  Make changes to your diet as told by your health care provider or diet and nutrition specialist (dietitian). This may include: Eating fewer calories. Eating more protein. Eating less unhealthy fats. Eating a diet that includes fresh fruits and vegetables, whole grains, low-fat dairy products, and lean protein. Avoiding foods with added fat, salt, and sugar. Drink plenty of water while you exercise to prevent dehydration or heat stroke. Activity Choose an activity that you enjoy and set realistic goals. Your health care provider can help you make an exercise plan that works for you. Exercise at a moderate or vigorous intensity most days of the week. The intensity of exercise may vary from person to person. You can tell how intense a workout is for you by paying attention to your breathing and heartbeat. Most people will notice their breathing and heartbeat get faster with more intense exercise. Do resistance training twice each week, such as: Push-ups. Sit-ups. Lifting weights. Using resistance bands. Getting short amounts of exercise can be just as helpful as long, structured periods of exercise. If you have trouble finding time to exercise, try  doing these things as part of your daily routine: Get up, stretch, and walk around every 30 minutes throughout the day. Go for a walk during your lunch break. Park your car farther away from your destination. If you take public transportation, get off one stop early and walk the rest of the way. Make phone calls while standing up and walking around. Take the stairs instead of elevators or escalators. Wear comfortable clothes and shoes with good support. Do not exercise so much that you hurt yourself, feel dizzy, or get very short of breath. Where to find more information [place] Department of Health and Human Services: ThisPath.fi Centers for Disease Control and Prevention: FootballExhibition.com.br Contact a health care provider: Before starting a new exercise program. If you have questions or concerns about your weight. If you have a medical problem that keeps you from exercising. Get help right away if: You have any of the following while exercising: Injury. Dizziness. Difficulty breathing or shortness of breath that does not go away when you stop exercising. Chest pain. Rapid heartbeat. These symptoms may represent a serious problem that is an emergency. Do not wait to see if the symptoms will go away. Get medical help right away. Call your local emergency services (911 in the [place] Do not drive yourself to the hospital. Summary Getting regular exercise is especially  important if you are overweight. Being overweight increases your risk of heart disease, stroke, diabetes, high blood pressure, and several types of cancer. Losing weight happens when you burn more calories than you eat. Reducing the amount of calories you eat, and getting regular moderate or vigorous exercise each week, helps you lose weight. This information is not intended to replace advice given to you by your health care provider. Make sure you discuss any questions you have with your health care provider. Document Revised:  01/16/2021 Document Reviewed: 01/16/2021 Elsevier Patient Education  2024 [employer].

## 2023-10-18 ENCOUNTER — Encounter: Payer: Self-pay | Admitting: Obstetrics and Gynecology

## 2023-12-29 NOTE — Patient Instructions (Signed)
Managing Anxiety, Adult  After being diagnosed with anxiety, you may be relieved to know why you have felt or behaved a certain way. You may also feel overwhelmed about the treatment ahead and what it will mean for your life. With care and support, you can manage your anxiety.  How to manage lifestyle changes  Understanding the difference between stress and anxiety  Although stress can play a role in anxiety, it is not the same as anxiety. Stress is your body's reaction to life changes and events, both good and bad. Stress is often caused by something external, such as a deadline, test, or competition. It normally goes away after the event has ended and will last just a few hours. But, stress can be ongoing and can lead to more than just stress.  Anxiety is caused by something internal, such as imagining a terrible outcome or worrying that something will go wrong that will greatly upset you. Anxiety often does not go away even after the event is over, and it can become a long-term (chronic) worry.  Lowering stress and anxiety    Talk with your health care provider or a counselor to learn more about lowering anxiety and stress. They may suggest tension-reduction techniques, such as:  Music. Spend time creating or listening to music that you enjoy and that inspires you.  Mindfulness-based meditation. Practice being aware of your normal breaths while not trying to control your breathing. It can be done while sitting or walking.  Centering prayer. Focus on a word, phrase, or sacred image that means something to you and brings you peace.  Deep breathing. Expand your stomach and inhale slowly through your nose. Hold your breath for 3-5 seconds. Then breathe out slowly, letting your stomach muscles relax.  Self-talk. Learn to notice and spot thought patterns that lead to anxiety reactions. Change those patterns to thoughts that feel peaceful.  Muscle relaxation. Take time to tense muscles and then relax them.  Choose a  tension-reduction technique that fits your lifestyle and personality. These techniques take time and practice. Set aside 5-15 minutes a day to do them. Specialized therapists can offer counseling and training in these techniques. The training to help with anxiety may be covered by some insurance plans.  Other things you can do to manage stress and anxiety include:  Keeping a stress diary. This can help you learn what triggers your reaction and then learn ways to manage your response.  Thinking about how you react to certain situations. You may not be able to control everything, but you can control your response.  Making time for activities that help you relax and not feeling guilty about spending your time in this way.  Doing visual imagery. This involves imagining or creating mental pictures to help you relax.  Practicing yoga. Through yoga poses, you can lower tension and relax.     Medicines  Medicines for anxiety include:  Antidepressant medicines. These are usually prescribed for long-term daily control.  Anti-anxiety medicines. These may be added in severe cases, especially when panic attacks occur.  When used together, medicines, psychotherapy, and tension-reduction techniques may be the most effective treatment.  Relationships  Relationships can play a big part in helping you recover. Spend more time connecting with trusted friends and family members. Think about going to couples counseling if you have a partner, taking family education classes, or going to family therapy. Therapy can help you and others better understand your anxiety.  How to recognize changes in  your anxiety  Everyone responds differently to treatment for anxiety. Recovery from anxiety happens when symptoms lessen and stop interfering with your daily life at home or work. This may mean that you will start to:  Have better concentration and focus. Worry will interfere less in your daily thinking.  Sleep better.  Be less irritable.  Have  more energy.  Have improved memory.  Try to recognize when your condition is getting worse. Contact your provider if your symptoms interfere with home or work and you feel like your condition is not improving.  Follow these instructions at home:  Activity  Exercise. Adults should:  Exercise for at least 150 minutes each week. The exercise should increase your heart rate and make you sweat (moderate-intensity exercise).  Do strengthening exercises at least twice a week.  Get the right amount and quality of sleep. Most adults need 7-9 hours of sleep each night.  Lifestyle    Eat a healthy diet that includes plenty of vegetables, fruits, whole grains, low-fat dairy products, and lean protein.  Do not eat a lot of foods that are high in fats, added sugars, or salt (sodium).  Make choices that simplify your life.  Do not use any products that contain nicotine or tobacco. These products include cigarettes, chewing tobacco, and vaping devices, such as e-cigarettes. If you need help quitting, ask your provider.  Avoid caffeine, alcohol, and certain over-the-counter cold medicines. These may make you feel worse. Ask your pharmacist which medicines to avoid.  General instructions  Take over-the-counter and prescription medicines only as told by your provider.  Keep all follow-up visits. This is to make sure you are managing your anxiety well or if you need more support.  Where to find support  You can get help and support from:  Self-help groups.  Online and [employer].  A trusted spiritual leader.  Couples counseling.  Family education classes.  Family therapy.  Where to find more information  You may find that joining a support group helps you deal with your anxiety. The following sources can help you find counselors or support groups near you:  Mental Health America: mentalhealthamerica.net  Anxiety and Depression Association of [place] (ADAA): adaa.org  [employer] Mental [employer]ness (NAMI):  nami.org  Contact a health care provider if:  You have a hard time staying focused or finishing tasks.  You spend many hours a day feeling worried about everyday life.  You are very tired because you cannot stop worrying.  You start to have headaches or often feel tense.  You have chronic nausea or diarrhea.  Get help right away if:  Your heart feels like it is racing.  You have shortness of breath.  You have thoughts of hurting yourself or others.  Get help right away if you feel like you may hurt yourself or others, or have thoughts about taking your own life. Go to your nearest emergency room or:  Call 911.  Call the National Suicide Prevention Lifeline at 316-825-8339 or 988. This is open 24 hours a day.  Text the Crisis Text Line at (407)774-9675.  This information is not intended to replace advice given to you by your health care provider. Make sure you discuss any questions you have with your health care provider.  Document Revised: 08/29/2022 Document Reviewed: 03/13/2021  Elsevier Patient Education  2024 [employer].

## 2023-12-31 ENCOUNTER — Encounter: Payer: Self-pay | Admitting: Nurse Practitioner

## 2023-12-31 ENCOUNTER — Ambulatory Visit (INDEPENDENT_AMBULATORY_CARE_PROVIDER_SITE_OTHER): Payer: Medicaid Other | Admitting: Nurse Practitioner

## 2023-12-31 VITALS — BP 114/66 | HR 82 | Temp 98.1°F | Ht 63.0 in | Wt 185.6 lb

## 2023-12-31 DIAGNOSIS — F3281 Premenstrual dysphoric disorder: Secondary | ICD-10-CM | POA: Diagnosis not present

## 2023-12-31 DIAGNOSIS — F5101 Primary insomnia: Secondary | ICD-10-CM | POA: Diagnosis not present

## 2023-12-31 DIAGNOSIS — G4700 Insomnia, unspecified: Secondary | ICD-10-CM | POA: Insufficient documentation

## 2023-12-31 MED ORDER — FLUOXETINE HCL 10 MG PO TABS: ORAL_TABLET | ORAL | 2 refills | Status: DC

## 2023-12-31 MED ORDER — TRAZODONE HCL 50 MG PO TABS: 25.0000 mg | ORAL_TABLET | Freq: Every evening | ORAL | 5 refills | Status: DC | PRN

## 2023-12-31 NOTE — Progress Notes (Signed)
BP 114/66   Pulse 82   Temp 98.1 F (36.7 C) (Oral)   Ht 5\' 3"  (1.6 m)   Wt 185 lb 9.6 oz (84.2 kg)   LMP 11/26/2023 (Approximate)   SpO2 98%   BMI 32.88 kg/m    Subjective:    Patient ID: Danielle Gillespie, female    DOB: [birth year], 33 y.o.   MRN: [id number]  HPI: Danielle Gillespie is a 33 y.o. female  Chief Complaint  Patient presents with   Depression   DEPRESSION Has stopped taking Prozac, stopped use about end of October 2024.  Feels like she is doing okay without it in regard to depression/anxiety.  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  Was busier in October then now, gives more time to think. Mood status: stable Satisfied with current treatment?: yes Symptom severity: moderate  Duration of current treatment : chronic Side effects: no Medication compliance: fair compliance Psychotherapy/counseling: none Previous psychiatric medications: Celexa Depressed mood: sometimes Anxious mood: no Anhedonia: no Significant weight loss or gain: no Insomnia: yes hard to fall asleep Fatigue: yes Feelings of worthlessness or guilt: no Impaired concentration/indecisiveness: no Suicidal ideations: no Hopelessness: no Crying spells: yes    12/31/2023    2:41 PM 06/27/2023    4:09 PM 05/16/2023    2:26 PM 10/04/2022    8:30 AM 09/05/2022   10:57 AM  Depression screen PHQ 2/9  Decreased Interest 0 0 0 0 0  Down, Depressed, Hopeless 0 2 0 0 0  PHQ - 2 Score 0 2 0 0 0  Altered sleeping 2 2 3  0   Tired, decreased energy 2 3 3  0   Change in appetite 2 0 0 0   Feeling bad or failure about yourself  0 1 1 0   Trouble concentrating 0 0 0 0   Moving slowly or fidgety/restless 0 0 0 0   Suicidal thoughts 0 0 0 0   PHQ-9 Score 6 8 7  0   Difficult doing work/chores Somewhat difficult Not difficult at all Somewhat difficult Not difficult at all        12/31/2023    2:41 PM 06/27/2023    4:11 PM 05/16/2023    2:26 PM 10/04/2022    8:31 AM  GAD 7 : Generalized Anxiety Score   Nervous, Anxious, on Edge 1 1 3  0  Control/stop worrying 0 1 3 0  Worry too much - different things 0 2 3 0  Trouble relaxing 2 3 2  0  Restless 0 0 1 0  Easily annoyed or irritable 1 1 1  0  Afraid - awful might happen 0 1 2 0  Total GAD 7 Score 4 9 15  0  Anxiety Difficulty Somewhat difficult Somewhat difficult Somewhat difficult Not difficult at all   Relevant past medical, surgical, family and social history reviewed and updated as indicated. Interim medical history since our last visit reviewed. Allergies and medications reviewed and updated.  Review of Systems  Constitutional:  Negative for activity change, appetite change, diaphoresis, fatigue and fever.  Respiratory:  Negative for cough, chest tightness and shortness of breath.   Cardiovascular:  Negative for chest pain, palpitations and leg swelling.  Neurological: Negative.   Psychiatric/Behavioral:  Positive for sleep disturbance. Negative for decreased concentration, self-injury and suicidal ideas. The patient is not nervous/anxious.    Per HPI unless specifically indicated above     Objective:    BP 114/66   Pulse 82   Temp 98.1 F (36.7  C) (Oral)   Ht 5\' 3"  (1.6 m)   Wt 185 lb 9.6 oz (84.2 kg)   LMP 11/26/2023 (Approximate)   SpO2 98%   BMI 32.88 kg/m   Wt Readings from Last 3 Encounters:  12/31/23 185 lb 9.6 oz (84.2 kg)  09/05/23 179 lb 11.2 oz (81.5 kg)  06/22/23 170 lb 6.4 oz (77.3 kg)    Physical Exam Vitals and nursing note reviewed.  Constitutional:      General: She is awake. She is not in acute distress.    Appearance: She is well-developed and well-groomed. She is obese. She is not ill-appearing or toxic-appearing.  HENT:     Head: Normocephalic.     Right Ear: Hearing and external ear normal.     Left Ear: Hearing and external ear normal.  Eyes:     General: Lids are normal.        Right eye: No discharge.        Left eye: No discharge.     Conjunctiva/sclera: Conjunctivae normal.      Pupils: Pupils are equal, round, and reactive to light.  Neck:     Thyroid: No thyromegaly.     Vascular: No carotid bruit.  Cardiovascular:     Rate and Rhythm: Normal rate and regular rhythm.     Heart sounds: Normal heart sounds. No murmur heard.    No gallop.  Pulmonary:     Effort: Pulmonary effort is normal. No accessory muscle usage or respiratory distress.     Breath sounds: Normal breath sounds.  Abdominal:     General: Bowel sounds are normal. There is no distension.     Palpations: Abdomen is soft.     Tenderness: There is no abdominal tenderness.  Musculoskeletal:     Cervical back: Normal range of motion and neck supple.     Right lower leg: No edema.     Left lower leg: No edema.  Lymphadenopathy:     Cervical: No cervical adenopathy.  Skin:    General: Skin is warm and dry.  Neurological:     Mental Status: She is alert and oriented to person, place, and time.     Deep Tendon Reflexes: Reflexes are normal and symmetric.     Reflex Scores:      Brachioradialis reflexes are 2+ on the right side and 2+ on the left side.      Patellar reflexes are 2+ on the right side and 2+ on the left side. Psychiatric:        Attention and Perception: Attention normal.        Mood and Affect: Mood normal.        Speech: Speech normal.        Behavior: Behavior normal. Behavior is cooperative.        Thought Content: Thought content normal.    Results for orders placed or performed in visit on 09/05/23  POCT urine pregnancy   Collection Time: 09/05/23 11:37 AM  Result Value Ref Range   Preg Test, Ur Negative Negative      Assessment & Plan:   Problem List Items Addressed This Visit       Other   Insomnia   Chronic, ongoing.  Will trial Trazodone 25-50 MG PRN nightly.  Educated her on this medication and use + side effects.  Instructed to call provider if any issues present. ?Maintain a regular sleep schedule, particularly a regular wake-up time in the morning ?Try not  to force  sleep ?Avoid caffeinated beverages after lunch ?Avoid alcohol near bedtime (eg, late afternoon and evening)  ?Avoid smoking or other nicotine intake, particularly during the evening ?Adjust the bedroom environment as needed to decrease stimuli (eg, reduce ambient light, turn off the television or radio) ?Avoid prolonged use of light-emitting screens (laptops, tablets, smartphones, ebooks) before bedtime  ?Resolve concerns or worries before bedtime ?Exercise regularly for at least 20 minutes, preferably more than four to five hours prior to bedtime  ?Avoid daytime naps, especially if they are longer than 20 to 30 minutes or occur late in the day       PMDD (premenstrual dysphoric disorder) - Primary   Ongoing, is not taking Prozac at present.  Will change to taking one week prior to cycle and keeping on board until cycle has completed, this may benefit mood shifts during this period.  Denies SI/HI.      Relevant Medications   FLUoxetine (PROZAC) 10 MG tablet   traZODone (DESYREL) 50 MG tablet     Follow up plan: Return in about 5 months (around 05/16/2024) for Annual Physical -- after 05/15/24.

## 2023-12-31 NOTE — Assessment & Plan Note (Signed)
Chronic, ongoing.  Will trial Trazodone 25-50 MG PRN nightly.  Educated her on this medication and use + side effects.  Instructed to call provider if any issues present. ?Maintain a regular sleep schedule, particularly a regular wake-up time in the morning ?Try not to force sleep ?Avoid caffeinated beverages after lunch ?Avoid alcohol near bedtime (eg, late afternoon and evening)  ?Avoid smoking or other nicotine intake, particularly during the evening ?Adjust the bedroom environment as needed to decrease stimuli (eg, reduce ambient light, turn off the television or radio) ?Avoid prolonged use of light-emitting screens (laptops, tablets, smartphones, ebooks) before bedtime  ?Resolve concerns or worries before bedtime ?Exercise regularly for at least 20 minutes, preferably more than four to five hours prior to bedtime  ?Avoid daytime naps, especially if they are longer than 20 to 30 minutes or occur late in the day

## 2023-12-31 NOTE — Assessment & Plan Note (Signed)
Ongoing, is not taking Prozac at present.  Will change to taking one week prior to cycle and keeping on board until cycle has completed, this may benefit mood shifts during this period.  Denies SI/HI.

## 2024-01-01 ENCOUNTER — Telehealth: Payer: Self-pay

## 2024-01-01 NOTE — Telephone Encounter (Signed)
PA for Fluoxetine initiated and submitted via Cover My Meds. Key: [id number]

## 2024-01-02 NOTE — Telephone Encounter (Signed)
PA approved. Called and notified patient of approval.

## 2024-01-24 ENCOUNTER — Encounter: Payer: Self-pay | Admitting: Nurse Practitioner

## 2024-01-25 ENCOUNTER — Ambulatory Visit (INDEPENDENT_AMBULATORY_CARE_PROVIDER_SITE_OTHER): Payer: Medicaid Other | Admitting: Nurse Practitioner

## 2024-01-25 VITALS — BP 115/66 | HR 86 | Temp 98.5°F | Ht 63.0 in | Wt 192.4 lb

## 2024-01-25 DIAGNOSIS — N926 Irregular menstruation, unspecified: Secondary | ICD-10-CM | POA: Diagnosis not present

## 2024-01-25 DIAGNOSIS — M79604 Pain in right leg: Secondary | ICD-10-CM | POA: Diagnosis not present

## 2024-01-25 DIAGNOSIS — M79672 Pain in left foot: Secondary | ICD-10-CM | POA: Insufficient documentation

## 2024-01-25 DIAGNOSIS — M79605 Pain in left leg: Secondary | ICD-10-CM

## 2024-01-25 DIAGNOSIS — Z833 Family history of diabetes mellitus: Secondary | ICD-10-CM

## 2024-01-25 LAB — BAYER DCA HB A1C WAIVED: HB A1C (BAYER DCA - WAIVED): 5.4 % (ref 4.8–5.6)

## 2024-01-25 NOTE — Assessment & Plan Note (Signed)
 For some time, reports starts in heel and radiates up back of leg both sides. ?Plantar fascitis.  Will check labs today per request.  A1c 5.4%.  Check: CBC, iron, ferritin, CMP, thyroid labs.  She is having some numbness as well and continues to gain weight.

## 2024-01-25 NOTE — Assessment & Plan Note (Signed)
 With recent cycle, shorter cycle and light.  Obtain urine pregnancy testing and check blood work today.  Discussed with her this may be normal changes, she is on BCP, and we will monitor.

## 2024-01-25 NOTE — Progress Notes (Signed)
 BP 115/66   Pulse 86   Temp 98.5 F (36.9 C) (Oral)   Ht 5\' 3"  (1.6 m)   Wt 192 lb 6.4 oz (87.3 kg)   LMP 01/04/2024 (Exact Date)   SpO2 98%   BMI 34.08 kg/m    Subjective:    Patient ID: Danielle Gillespie, female    DOB: Jan 17, 1991, 33 y.o.   MRN: [id number]  HPI: Danielle Gillespie is a 33 y.o. female  Chief Complaint  Patient presents with   Diabetes    Patient states she is here to discuss possibly having diabetes. States she has been gaining weight for the last 4 months and has also had pain in her lower legs and feet. States that diabetes runs on her moms side.    Menstrual Problem    Patient states that her last cycle she had was shorter than normal, 3 days, and not heavy. States the blood seemed to be darker in color. States she normally has very heavy periods and they last 5 to 6 days.    LEG PAIN Has been present for a few months, worse in the morning from bottom of heels and up to calves.  Thought it was plantar fasciitis and bought new shoes, but this did not help.  Family history of diabetes, her mom was around her age when diagnosed -- had to get glucose tolerance test for diagnosis -- A1c was normal. She endorses polyuria and polydipsia, no polyphagia. Duration: months Pain: yes Severity: 9/10  Quality: sharp, tender feeling Location:  lower legs Bilateral:  yes Onset: sudden Frequency: constant but level of pain varies Time of  day:   at random Sudden unintentional leg jerking:   no Paresthesias:   yes Decreased sensation:  yes Weakness:   yes Insomnia:   yes - Trazodone helping Fatigue:   no Alleviating factors: Tylenol helps a little Aggravating factors: unknown Status: fluctuating Treatments attempted: Tylenol  ABNORMAL MENSTRUAL PERIODS [id number] -- noticed changes with recent cycle, lasted 3 days.  At baseline has heavier cycles. Last child in January 2022, has been on BCP since then. Duration: weeks Average interval between menses: 22-24 days Length of  menses: 6 days Flow: heavy Dysmenorrhea: yes Intermenstrual bleeding:no Postcoital bleeding: no Contraception: oral contraceptives, menopause, nuvaring, and abstinence Menarche at age: 6 Sexual activity: In a Monogamous Relationship History of sexually transmitted diseases: yes History GYN procedures: no Abnormal pap smears: yes   Dyspareunia: no Vaginal discharge:no Abdominal pain: no Galactorrhea: no Hirsuitism: no Frequent bruising/mucosal bleeding: no Double vision:no Hot flashes: occasional  Relevant past medical, surgical, family and social history reviewed and updated as indicated. Interim medical history since our last visit reviewed. Allergies and medications reviewed and updated.  Review of Systems  Constitutional:  Negative for activity change, appetite change, diaphoresis, fatigue and fever.  Respiratory:  Negative for cough, chest tightness and shortness of breath.   Cardiovascular:  Negative for chest pain, palpitations and leg swelling.  Gastrointestinal: Negative.  Negative for abdominal distention.  Endocrine: Positive for polydipsia, polyphagia and polyuria. Negative for cold intolerance and heat intolerance.  Musculoskeletal:  Positive for arthralgias.  Neurological:  Positive for numbness. Negative for dizziness, tremors, syncope, facial asymmetry, weakness and headaches.  Psychiatric/Behavioral: Negative.     Per HPI unless specifically indicated above     Objective:    BP 115/66   Pulse 86   Temp 98.5 F (36.9 C) (Oral)   Ht 5\' 3"  (1.6 m)   Wt 192 lb 6.4 oz (  87.3 kg)   LMP 01/04/2024 (Exact Date)   SpO2 98%   BMI 34.08 kg/m   Wt Readings from Last 3 Encounters:  01/25/24 192 lb 6.4 oz (87.3 kg)  12/31/23 185 lb 9.6 oz (84.2 kg)  09/05/23 179 lb 11.2 oz (81.5 kg)    Physical Exam Vitals and nursing note reviewed.  Constitutional:      General: She is awake. She is not in acute distress.    Appearance: Normal appearance. She is  well-developed and well-groomed. She is obese. She is not ill-appearing or toxic-appearing.  HENT:     Head: Normocephalic.     Right Ear: Hearing and external ear normal.     Left Ear: Hearing and external ear normal.  Eyes:     General: Lids are normal.        Right eye: No discharge.        Left eye: No discharge.     Conjunctiva/sclera: Conjunctivae normal.     Pupils: Pupils are equal, round, and reactive to light.  Neck:     Thyroid: No thyromegaly.     Vascular: No carotid bruit.  Cardiovascular:     Rate and Rhythm: Normal rate and regular rhythm.     Heart sounds: Normal heart sounds. No murmur heard.    No gallop.  Pulmonary:     Effort: Pulmonary effort is normal. No accessory muscle usage or respiratory distress.     Breath sounds: Normal breath sounds.  Abdominal:     General: Bowel sounds are normal. There is no distension.     Palpations: Abdomen is soft.     Tenderness: There is no abdominal tenderness.  Musculoskeletal:     Cervical back: Normal range of motion and neck supple.     Right lower leg: No edema.     Left lower leg: No edema.  Lymphadenopathy:     Cervical: No cervical adenopathy.  Skin:    General: Skin is warm and dry.  Neurological:     Mental Status: She is alert and oriented to person, place, and time.     Cranial Nerves: Cranial nerves 2-12 are intact.     Gait: Gait is intact.     Deep Tendon Reflexes: Reflexes are normal and symmetric.     Reflex Scores:      Brachioradialis reflexes are 2+ on the right side and 2+ on the left side.      Patellar reflexes are 2+ on the right side and 2+ on the left side. Psychiatric:        Attention and Perception: Attention normal.        Mood and Affect: Mood normal.        Speech: Speech normal.        Behavior: Behavior normal. Behavior is cooperative.        Thought Content: Thought content normal.     Results for orders placed or performed in visit on 09/05/23  POCT urine pregnancy    Collection Time: 09/05/23 11:37 AM  Result Value Ref Range   Preg Test, Ur Negative Negative      Assessment & Plan:   Problem List Items Addressed This Visit       Other   Family history of diabetes mellitus   Mother an grandmother, mother diagnosed in her 38's.  She reports both had normal A1c, but abnormal glucose tolerance.  Her A1c today is 5.4%.  She would like glucose tolerance test, will order this.  Relevant Orders   Bayer DCA Hb A1c Waived   Glucose tolerance, 2 hours([facility])   Menstrual changes   With recent cycle, shorter cycle and light.  Obtain urine pregnancy testing and check blood work today.  Discussed with her this may be normal changes, she is on BCP, and we will monitor.      Relevant Orders   Urinalysis, Routine w reflex microscopic   WET PREP FOR TRICH, YEAST, CLUE   Pregnancy, urine   Pain in both lower extremities - Primary   For some time, reports starts in heel and radiates up back of leg both sides. ?Plantar fascitis.  Will check labs today per request.  A1c 5.4%.  Check: CBC, iron, ferritin, CMP, thyroid labs.  She is having some numbness as well and continues to gain weight.      Relevant Orders   TSH   Thyroid peroxidase antibody   T4, free   Comprehensive metabolic panel   CBC with Differential/Platelet   Ferritin   Iron     Follow up plan: Return if symptoms worsen or fail to improve.

## 2024-01-25 NOTE — Assessment & Plan Note (Signed)
 Mother an grandmother, mother diagnosed in her 34's.  She reports both had normal A1c, but abnormal glucose tolerance.  Her A1c today is 5.4%.  She would like glucose tolerance test, will order this.

## 2024-01-25 NOTE — Patient Instructions (Signed)
 Plantar Fasciitis: What to Know  Your plantar fascia is a band of thick tissue on the bottom of your foot. It connects your heel bone to the base of your toes. If the fascia gets irritated, it can cause pain in your heel or foot. This is called plantar fasciitis. In some cases, plantar fasciitis can make it hard for you to walk or move. The pain is often worse in the morning after sleeping, or after sitting or lying down for a long time. Pain may also be worse after walking or standing for a long time. What are the causes? Plantar fasciitis may be caused by: Standing for a long time. Wearing shoes that don't have good arch support. Doing high-impact activities. These are things that put stress on your joints. They include: Ballet. Aerobic exercises. These are exercises that make your heart beat faster. Being overweight. Having a way of walking, or gait, that isn't normal. Tight muscles in your calf, which is in the back of your lower leg. High arches in your feet, or flat feet. Starting a new sport or activity. What are the signs or symptoms? The main symptom of plantar fasciitis is heel pain. Your pain may get worse after: You take your first steps after a time of rest. This includes in the morning after you wake up, or after you've been sitting or lying down for a while. Standing still for a long time. Pain may lessen after 30-45 minutes of activity, such as gentle walking. How is this diagnosed? Plantar fasciitis may be diagnosed based on your medical history, your symptoms, and an exam. Your health care provider will check for: A tender spot on the bottom of your foot. A high arch in your foot, or flat feet. Pain when you move your foot. Trouble moving your foot. You may also have tests. These may include: X-rays. Ultrasound. MRI. How is this treated? Treatment depends on how bad your plantar fasciitis is. It may include: RICE therapy. This stands for rest, ice, pressure  (compression), and raising (elevating) the foot. Exercises to stretch your calves and plantar fascia. A night splint. This holds your foot in a stretched, upward position while you sleep. Physical therapy. This can help with symptoms. It can also prevent problems in the future. Shots of a steroid medicine called cortisone. This can help with pain and irritation. Extracorporeal shock wave therapy. This uses electric shocks to stimulate your plantar fascia. If other treatments don't help, you may need to have surgery. Follow these instructions at home: Managing pain, stiffness, and swelling  Use ice, an ice pack, or a frozen bottle of water as told. Place a towel between your skin and the ice. Roll the bottom of your foot over the ice or frozen bottle. Do this for 20 minutes, 2-3 times a day. If your skin turns red, take off the ice right away to prevent skin damage. The risk of damage is higher if you can't feel pain, heat, or cold. Wear shoes that have air-sole or gel-sole cushions. You could also try soft shoe inserts made for plantar fasciitis. Activity Try not to do things that cause pain. Ask what things are safe for you to do. Exercise as told. Try activities that are low impact. This means that they're easier on your joints. They include: Swimming. Water aerobics. Biking. General instructions Take your medicines only as told. Wear a night splint as told. Loosen the splint if your toes tingle, are numb, or turn cold and blue.  Stay at a healthy weight. Work with your provider to lose weight as needed. Contact a health care provider if: Your symptoms don't go away with treatment. You have pain that gets worse. Your pain makes it hard to move or do everyday things. This information is not intended to replace advice given to you by your health care provider. Make sure you discuss any questions you have with your health care provider. Document Revised: 04/23/2023 Document Reviewed:  04/23/2023 Elsevier Patient Education  2024 [employer].

## 2024-01-26 ENCOUNTER — Encounter: Payer: Self-pay | Admitting: Nurse Practitioner

## 2024-01-26 NOTE — Progress Notes (Signed)
 Contacted via MyChart   Good evening [place], your labs have returned and overall are stable: - Kidney function, creatinine and eGFR, remains normal, as is liver function, AST and ALT.  - Glucose level is a little elevated, if you ate beforehand this is to be expected. - Thyroid labs are all normal. - CBC and iron level show no anemia or infection.  Any questions? Keep being stellar!!  Thank you for allowing me to participate in your care.  I appreciate you. Kindest regards, Amri Lien

## 2024-01-27 LAB — CBC WITH DIFFERENTIAL/PLATELET
Basophils Absolute: 0.1 10*3/uL (ref 0.0–0.2)
Basos: 1 %
EOS (ABSOLUTE): 0.1 10*3/uL (ref 0.0–0.4)
Eos: 1 %
Hematocrit: 39.8 % (ref 34.0–46.6)
Hemoglobin: 13.6 g/dL (ref 11.1–15.9)
Immature Grans (Abs): 0 10*3/uL (ref 0.0–0.1)
Immature Granulocytes: 0 %
Lymphocytes Absolute: 2.6 10*3/uL (ref 0.7–3.1)
Lymphs: 26 %
MCH: 32 pg (ref 26.6–33.0)
MCHC: 34.2 g/dL (ref 31.5–35.7)
MCV: 94 fL (ref 79–97)
Monocytes Absolute: 0.5 10*3/uL (ref 0.1–0.9)
Monocytes: 5 %
Neutrophils Absolute: 6.5 10*3/uL (ref 1.4–7.0)
Neutrophils: 67 %
Platelets: 357 10*3/uL (ref 150–450)
RBC: 4.25 x10E6/uL (ref 3.77–5.28)
RDW: 12.3 % (ref 11.7–15.4)
WBC: 9.9 10*3/uL (ref 3.4–10.8)

## 2024-01-27 LAB — COMPREHENSIVE METABOLIC PANEL
ALT: 15 IU/L (ref 0–32)
AST: 13 IU/L (ref 0–40)
Albumin: 3.9 g/dL (ref 3.9–4.9)
Alkaline Phosphatase: 64 IU/L (ref 44–121)
BUN/Creatinine Ratio: 10 (ref 9–23)
BUN: 7 mg/dL (ref 6–20)
Bilirubin Total: <0.2 mg/dL (ref 0.0–1.2)
CO2: 23 mmol/L (ref 20–29)
Calcium: 9.3 mg/dL (ref 8.7–10.2)
Chloride: 105 mmol/L (ref 96–106)
Creatinine, Ser: 0.69 mg/dL (ref 0.57–1.00)
Globulin, Total: 2.9 g/dL (ref 1.5–4.5)
Glucose: 129 mg/dL — ABNORMAL HIGH (ref 70–99)
Potassium: 4.4 mmol/L (ref 3.5–5.2)
Sodium: 138 mmol/L (ref 134–144)
Total Protein: 6.8 g/dL (ref 6.0–8.5)
eGFR: 118 mL/min/{1.73_m2} (ref >59)

## 2024-01-27 LAB — FERRITIN: Ferritin: 20 ng/mL (ref 15–150)

## 2024-01-27 LAB — THYROID PEROXIDASE ANTIBODY: Thyroperoxidase Ab SerPl-aCnc: 14 [IU]/mL (ref 0–34)

## 2024-01-27 LAB — TSH: TSH: 1.48 u[IU]/mL (ref 0.450–4.500)

## 2024-01-27 LAB — IRON: Iron: 95 ug/dL (ref 27–159)

## 2024-01-27 LAB — T4, FREE: Free T4: 1 ng/dL (ref 0.82–1.77)

## 2024-01-30 ENCOUNTER — Other Ambulatory Visit: Payer: Medicaid Other

## 2024-01-31 ENCOUNTER — Other Ambulatory Visit: Payer: Self-pay | Admitting: Nurse Practitioner

## 2024-02-01 NOTE — Telephone Encounter (Signed)
 Pharmacy comment: REQUEST FOR 90 DAYS PRESCRIPTION.   Requested Prescriptions  Pending Prescriptions Disp Refills   traZODone (DESYREL) 50 MG tablet [Pharmacy Med Name: TRAZODONE 50 MG TABLET] 90 tablet 1    Sig: TAKE 0.5-1 TABLETS BY MOUTH AT BEDTIME AS NEEDED FOR SLEEP.     Psychiatry: Antidepressants - Serotonin Modulator Passed - 02/01/2024 11:52 AM      Passed - Completed PHQ-2 or PHQ-9 in the last 360 days      Passed - Valid encounter within last 6 months    Recent Outpatient Visits           1 month ago PMDD (premenstrual dysphoric disorder)   [place] [facility] [place], Dorie Rank, NP   7 months ago Grief reaction   [place] [facility] [place], [place] T, NP   8 months ago Elevated low density lipoprotein (LDL) cholesterol level   [place] [facility] [place], [place] T, NP   1 year ago Otalgia of both ears   [place] [facility] [place], [place] T, NP   1 year ago History of postpartum depression   [place] [facility] [place], Dorie Rank, NP       Future Appointments             In 4 months [place], Dorie Rank, NP [place] [facility]             FLUoxetine (PROZAC) 10 MG tablet [Pharmacy Med Name: FLUOXETINE HCL 10 MG TABLET] 90 tablet 1    Sig: TAKE ONE TABLET BY MOUTH DAILY STARTING AT ONE WEEK PRIOR TO MENSTRUAL CYCLE AND THEN STOP ONCE MENSTRUAL CYCLE COMPLETE     Psychiatry:  Antidepressants - SSRI Passed - 02/01/2024 11:52 AM      Passed - Completed PHQ-2 or PHQ-9 in the last 360 days      Passed - Valid encounter within last 6 months    Recent Outpatient Visits           1 month ago PMDD (premenstrual dysphoric disorder)   [place] [facility] [place], Dorie Rank, NP   7 months ago Grief reaction   [place] [facility] [place], [place] T, NP   8 months ago Elevated low density lipoprotein (LDL) cholesterol level   [facility] [place], [place] T, NP   1 year ago Otalgia of both ears   [place] [facility] [place], [place] T, NP   1 year ago History of postpartum depression   [place] [facility] [place], Dorie Rank, NP       Future Appointments             In 4 months Cannady, Dorie Rank, NP  [facility], [facility]

## 2024-02-18 ENCOUNTER — Other Ambulatory Visit: Payer: Self-pay | Admitting: Obstetrics and Gynecology

## 2024-03-10 ENCOUNTER — Encounter: Payer: Self-pay | Admitting: Nurse Practitioner

## 2024-03-10 ENCOUNTER — Telehealth (INDEPENDENT_AMBULATORY_CARE_PROVIDER_SITE_OTHER): Admitting: Nurse Practitioner

## 2024-03-10 DIAGNOSIS — M79671 Pain in right foot: Secondary | ICD-10-CM

## 2024-03-10 DIAGNOSIS — M79672 Pain in left foot: Secondary | ICD-10-CM

## 2024-03-10 MED ORDER — MELOXICAM 15 MG PO TABS: 15.0000 mg | ORAL_TABLET | Freq: Every day | ORAL | 0 refills | Status: DC

## 2024-03-10 NOTE — Progress Notes (Signed)
 There were no vitals taken for this visit.   Subjective:    Patient ID: Danielle Gillespie, female    DOB: [birth year], 33 y.o.   MRN: [id number]  HPI: Danielle Gillespie is a 33 y.o. female  Chief Complaint  Patient presents with   Foot Pain    Patient states she has been having pain in both of her feet since the beginning of the year. States she feels the pains in and around her heels. States the pain started out as coming and going but has become more constant. States she has been taking Tylenol daily as well some ibuprofen which are both no longer helping.    Virtual Visit via Video Note  I connected with Danielle Gillespie on 03/10/24 at  4:20 PM EDT by a video enabled telemedicine application and verified that I am speaking with the correct person using two identifiers.  Location: Patient: home Provider: work   I discussed the limitations of evaluation and management by telemedicine and the availability of in person appointments. The patient expressed understanding and agreed to proceed.  I discussed the assessment and treatment plan with the patient. The patient was provided an opportunity to ask questions and all were answered. The patient agreed with the plan and demonstrated an understanding of the instructions.   The patient was advised to call back or seek an in-person evaluation if the symptoms worsen or if the condition fails to improve as anticipated.  I provided 25 minutes of non-face-to-face time during this encounter.   Marjie Skiff, NP   FOOT PAIN Presents today for foot pain to both heels and medial aspect of posterior foot.  This has been present since the beginning of year. When gets out of bed will hurt badly.  Past two nights has woken her up from sleep.  Feels this is worsening.  Recent A1c 5.4%.  Reports her mother has neuropathy, but she is not sure this is neuropathy anymore. Duration: months Involved foot: bilateral Mechanism of injury: unknown Location: as  above Onset: gradual  Severity: 10/10 keeping her from doing day to day things Quality:  sharp, aching, burning, and tender Frequency: constant the last two weeks Radiation: no Aggravating factors: weight bearing and movementAlleviating factors: Tylenol and Ibuprofen helps initially + ice helps on occasion Status: worse Treatments attempted: Tylenol and Ibuprofen, ice   Relief with NSAIDs?:   initially, but not anymore Weakness with weight bearing or walking: yes Morning stiffness: yes Swelling: occasional to medial aspect Redness: no Bruising: no Paresthesias / decreased sensation: occasional  Fevers:no   Relevant past medical, surgical, family and social history reviewed and updated as indicated. Interim medical history since our last visit reviewed. Allergies and medications reviewed and updated.  Review of Systems  Constitutional:  Negative for activity change, appetite change, diaphoresis, fatigue and fever.  Respiratory:  Negative for cough, chest tightness and shortness of breath.   Cardiovascular:  Negative for chest pain, palpitations and leg swelling.  Gastrointestinal: Negative.   Endocrine: Negative for polydipsia, polyphagia and polyuria.  Musculoskeletal:  Positive for arthralgias.  Psychiatric/Behavioral: Negative.      Per HPI unless specifically indicated above     Objective:    There were no vitals taken for this visit.  Wt Readings from Last 3 Encounters:  01/25/24 192 lb 6.4 oz (87.3 kg)  12/31/23 185 lb 9.6 oz (84.2 kg)  09/05/23 179 lb 11.2 oz (81.5 kg)    Physical Exam Vitals and nursing note reviewed.  Constitutional:      General: She is awake. She is not in acute distress.    Appearance: She is well-developed and well-groomed. She is not ill-appearing or toxic-appearing.  HENT:     Head: Normocephalic.     Right Ear: Hearing normal.     Left Ear: Hearing normal.  Eyes:     General: Lids are normal.        Right eye: No discharge.         Left eye: No discharge.     Conjunctiva/sclera: Conjunctivae normal.  Pulmonary:     Effort: Pulmonary effort is normal. No accessory muscle usage or respiratory distress.  Musculoskeletal:     Cervical back: Normal range of motion.  Neurological:     Mental Status: She is alert and oriented to person, place, and time.  Psychiatric:        Attention and Perception: Attention normal.        Mood and Affect: Mood normal.        Behavior: Behavior normal. Behavior is cooperative.        Thought Content: Thought content normal.        Judgment: Judgment normal.    Results for orders placed or performed in visit on 01/25/24  Bayer DCA Hb A1c Waived   Collection Time: 01/25/24 11:33 AM  Result Value Ref Range   HB A1C (BAYER DCA - WAIVED) 5.4 4.8 - 5.6 %  TSH   Collection Time: 01/25/24 11:34 AM  Result Value Ref Range   TSH 1.480 0.450 - 4.500 uIU/mL  Thyroid peroxidase antibody   Collection Time: 01/25/24 11:34 AM  Result Value Ref Range   Thyroperoxidase Ab SerPl-aCnc 14 0 - 34 IU/mL  T4, free   Collection Time: 01/25/24 11:34 AM  Result Value Ref Range   Free T4 1.00 0.82 - 1.77 ng/dL  Comprehensive metabolic panel   Collection Time: 01/25/24 11:34 AM  Result Value Ref Range   Glucose 129 (H) 70 - 99 mg/dL   BUN 7 6 - 20 mg/dL   Creatinine, Ser 6.57 0.57 - 1.00 mg/dL   eGFR 846 >96 EX/BMW/4.13   BUN/Creatinine Ratio 10 9 - 23   Sodium 138 134 - 144 mmol/L   Potassium 4.4 3.5 - 5.2 mmol/L   Chloride 105 96 - 106 mmol/L   CO2 23 20 - 29 mmol/L   Calcium 9.3 8.7 - 10.2 mg/dL   Total Protein 6.8 6.0 - 8.5 g/dL   Albumin 3.9 3.9 - 4.9 g/dL   Globulin, Total 2.9 1.5 - 4.5 g/dL   Bilirubin Total <2.4 0.0 - 1.2 mg/dL   Alkaline Phosphatase 64 44 - 121 IU/L   AST 13 0 - 40 IU/L   ALT 15 0 - 32 IU/L  CBC with Differential/Platelet   Collection Time: 01/25/24 11:34 AM  Result Value Ref Range   WBC 9.9 3.4 - 10.8 x10E3/uL   RBC 4.25 3.77 - 5.28 x10E6/uL   Hemoglobin 13.6  11.1 - 15.9 g/dL   Hematocrit 40.1 02.7 - 46.6 %   MCV 94 79 - 97 fL   MCH 32.0 26.6 - 33.0 pg   MCHC 34.2 31.5 - 35.7 g/dL   RDW 25.3 66.4 - 40.3 %   Platelets 357 150 - 450 x10E3/uL   Neutrophils 67 Not Estab. %   Lymphs 26 Not Estab. %   Monocytes 5 Not Estab. %   Eos 1 Not Estab. %   Basos 1 Not Estab. %  Neutrophils Absolute 6.5 1.4 - 7.0 x10E3/uL   Lymphocytes Absolute 2.6 0.7 - 3.1 x10E3/uL   Monocytes Absolute 0.5 0.1 - 0.9 x10E3/uL   EOS (ABSOLUTE) 0.1 0.0 - 0.4 x10E3/uL   Basophils Absolute 0.1 0.0 - 0.2 x10E3/uL   Immature Granulocytes 0 Not Estab. %   Immature Grans (Abs) 0.0 0.0 - 0.1 x10E3/uL  Ferritin   Collection Time: 01/25/24 11:34 AM  Result Value Ref Range   Ferritin 20 15 - 150 ng/mL  Iron   Collection Time: 01/25/24 11:34 AM  Result Value Ref Range   Iron 95 27 - 159 ug/dL      Assessment & Plan:   Problem List Items Addressed This Visit       Other   Bilateral foot pain - Primary   To both feet, worsening.  Recent A1c 5.4%.  Mother has neuropathy.  Suspect this is more tendonitis vs plantar fascitis.  Will place referral to podiatry since no benefit from simple and OTC treatment.  Send in Meloxicam 15 MG to take daily until able to see podiatry.  Educated her on this.      Relevant Orders   Ambulatory referral to Podiatry     Follow up plan: Return if symptoms worsen or fail to improve.

## 2024-03-10 NOTE — Assessment & Plan Note (Signed)
 To both feet, worsening.  Recent A1c 5.4%.  Mother has neuropathy.  Suspect this is more tendonitis vs plantar fascitis.  Will place referral to podiatry since no benefit from simple and OTC treatment.  Send in Meloxicam 15 MG to take daily until able to see podiatry.  Educated her on this.

## 2024-03-10 NOTE — Patient Instructions (Signed)
 Plantar Fasciitis: What to Know  Your plantar fascia is a band of thick tissue on the bottom of your foot. It connects your heel bone to the base of your toes. If the fascia gets irritated, it can cause pain in your heel or foot. This is called plantar fasciitis. In some cases, plantar fasciitis can make it hard for you to walk or move. The pain is often worse in the morning after sleeping, or after sitting or lying down for a long time. Pain may also be worse after walking or standing for a long time. What are the causes? Plantar fasciitis may be caused by: Standing for a long time. Wearing shoes that don't have good arch support. Doing high-impact activities. These are things that put stress on your joints. They include: Ballet. Aerobic exercises. These are exercises that make your heart beat faster. Being overweight. Having a way of walking, or gait, that isn't normal. Tight muscles in your calf, which is in the back of your lower leg. High arches in your feet, or flat feet. Starting a new sport or activity. What are the signs or symptoms? The main symptom of plantar fasciitis is heel pain. Your pain may get worse after: You take your first steps after a time of rest. This includes in the morning after you wake up, or after you've been sitting or lying down for a while. Standing still for a long time. Pain may lessen after 30-45 minutes of activity, such as gentle walking. How is this diagnosed? Plantar fasciitis may be diagnosed based on your medical history, your symptoms, and an exam. Your health care provider will check for: A tender spot on the bottom of your foot. A high arch in your foot, or flat feet. Pain when you move your foot. Trouble moving your foot. You may also have tests. These may include: X-rays. Ultrasound. MRI. How is this treated? Treatment depends on how bad your plantar fasciitis is. It may include: RICE therapy. This stands for rest, ice, pressure  (compression), and raising (elevating) the foot. Exercises to stretch your calves and plantar fascia. A night splint. This holds your foot in a stretched, upward position while you sleep. Physical therapy. This can help with symptoms. It can also prevent problems in the future. Shots of a steroid medicine called cortisone. This can help with pain and irritation. Extracorporeal shock wave therapy. This uses electric shocks to stimulate your plantar fascia. If other treatments don't help, you may need to have surgery. Follow these instructions at home: Managing pain, stiffness, and swelling  Use ice, an ice pack, or a frozen bottle of water as told. Place a towel between your skin and the ice. Roll the bottom of your foot over the ice or frozen bottle. Do this for 20 minutes, 2-3 times a day. If your skin turns red, take off the ice right away to prevent skin damage. The risk of damage is higher if you can't feel pain, heat, or cold. Wear shoes that have air-sole or gel-sole cushions. You could also try soft shoe inserts made for plantar fasciitis. Activity Try not to do things that cause pain. Ask what things are safe for you to do. Exercise as told. Try activities that are low impact. This means that they're easier on your joints. They include: Swimming. Water aerobics. Biking. General instructions Take your medicines only as told. Wear a night splint as told. Loosen the splint if your toes tingle, are numb, or turn cold and blue.  Stay at a healthy weight. Work with your provider to lose weight as needed. Contact a health care provider if: Your symptoms don't go away with treatment. You have pain that gets worse. Your pain makes it hard to move or do everyday things. This information is not intended to replace advice given to you by your health care provider. Make sure you discuss any questions you have with your health care provider. Document Revised: 04/23/2023 Document Reviewed:  04/23/2023 Elsevier Patient Education  2024 [employer].

## 2024-03-18 ENCOUNTER — Encounter: Payer: Self-pay | Admitting: Obstetrics and Gynecology

## 2024-03-18 ENCOUNTER — Ambulatory Visit: Admitting: Obstetrics and Gynecology

## 2024-03-18 ENCOUNTER — Other Ambulatory Visit (HOSPITAL_COMMUNITY)
Admission: RE | Admit: 2024-03-18 | Discharge: 2024-03-18 | Disposition: A | Discharge status: Home / Self Care | Source: Ambulatory Visit | Attending: Obstetrics and Gynecology | Admitting: Obstetrics and Gynecology

## 2024-03-18 VITALS — BP 122/74 | HR 77 | Ht 63.0 in | Wt 195.8 lb

## 2024-03-18 DIAGNOSIS — E7800 Pure hypercholesterolemia, unspecified: Secondary | ICD-10-CM

## 2024-03-18 DIAGNOSIS — Z124 Encounter for screening for malignant neoplasm of cervix: Secondary | ICD-10-CM | POA: Insufficient documentation

## 2024-03-18 DIAGNOSIS — Z01419 Encounter for gynecological examination (general) (routine) without abnormal findings: Secondary | ICD-10-CM

## 2024-03-18 DIAGNOSIS — E66811 Obesity, class 1: Secondary | ICD-10-CM

## 2024-03-18 MED ORDER — ONDANSETRON 4 MG PO TBDP
4.0000 mg | ORAL_TABLET | Freq: Three times a day (TID) | ORAL | 2 refills | Status: AC | PRN
Start: 2024-03-18 — End: ?

## 2024-03-18 MED ORDER — NORGESTIMATE-ETH ESTRADIOL 0.25-35 MG-MCG PO TABS: 1.0000 | ORAL_TABLET | Freq: Every day | ORAL | 3 refills | Status: DC

## 2024-03-18 MED ORDER — SEMAGLUTIDE-WEIGHT MANAGEMENT 1 MG/0.5ML ~~LOC~~ SOAJ: 1.0000 mg | SUBCUTANEOUS | 0 refills | Status: AC

## 2024-03-18 MED ORDER — SEMAGLUTIDE-WEIGHT MANAGEMENT 1.7 MG/0.75ML ~~LOC~~ SOAJ: 1.7000 mg | SUBCUTANEOUS | 0 refills | Status: AC

## 2024-03-18 MED ORDER — SEMAGLUTIDE-WEIGHT MANAGEMENT 0.25 MG/0.5ML ~~LOC~~ SOAJ: 0.2500 mg | SUBCUTANEOUS | 0 refills | Status: AC

## 2024-03-18 MED ORDER — SEMAGLUTIDE-WEIGHT MANAGEMENT 2.4 MG/0.75ML ~~LOC~~ SOAJ: 2.4000 mg | SUBCUTANEOUS | 0 refills | Status: AC

## 2024-03-18 MED ORDER — SEMAGLUTIDE-WEIGHT MANAGEMENT 0.5 MG/0.5ML ~~LOC~~ SOAJ: 0.5000 mg | SUBCUTANEOUS | 0 refills | Status: AC

## 2024-03-18 NOTE — Progress Notes (Signed)
 GYNECOLOGY ANNUAL PHYSICAL EXAM PROGRESS NOTE  Subjective:    Danielle Gillespie is a 33 y.o. G7P1001 female who presents for an annual exam.  She has a PMH of obesity and mild dyslipidemia.The patient is sexually active. The patient participates in regular exercise: no. Has the patient ever been transfused or tattooed?: yes. The patient reports that there is not domestic violence in her life.   The patient has the following complaints today: She would like to start her weight loss medication again. She started taking Wegovy last November, but notes that she stopped taking the medication in December because the increased dose made her very nauseous after the dose increase. 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 She reports she has gained more weight since stopping the medication. Also wonders if she needs to change her birth control once restarting the medication.   Menstrual History: Menarche age: 78 Patient's last menstrual period was 02/23/2024. Period Cycle (Days): 28 Period Duration (Days): 4-5 Period Pattern: Regular Menstrual Flow: Heavy Menstrual Control: Maxi pad Menstrual Control Change Freq (Hours): 1-3 Dysmenorrhea: (!) Severe Dysmenorrhea Symptoms: Cramping, Headache   Gynecologic History:  Contraception: OCP (estrogen/progesterone) History of STI's: Trichomoniasis Last Pap: 05/30/2021. Results were: normal. Notes h/o abnormal pap smears. Last mammogram: Never Done   OB History  Gravida Para Term Preterm AB Living  1 1 1  0 0 1  SAB IAB Ectopic Multiple Live Births  0 0 0 0 1    # Outcome Date GA Lbr Len/2nd Weight Sex Type Anes PTL Lv  1 Term 12/05/20 [redacted]w[redacted]d 24:14 / 06:02 6 lb 14.1 oz (3.12 kg) F Vag-Vacuum EPI, Local  LIV     Name: Anello,GIRL Trany     Apgar1: 4  Apgar5: 9    Past Medical History:  Diagnosis Date   Anxiety    Depression     Past Surgical History:  Procedure Laterality Date   NO PAST  SURGERIES      Family History  Problem Relation Age of Onset   Depression Mother    Diabetes Maternal Grandmother    Bipolar disorder Maternal Grandmother    Heart disease Maternal Grandmother    Depression Maternal Grandmother    Healthy Father    Dementia Paternal Grandmother    Depression Sister    Depression Brother     Social History   Socioeconomic History   Marital status: Significant Other    Spouse name: Not on file   Number of children: Not on file   Years of education: Not on file   Highest education level: 12th grade  Occupational History   Not on file  Tobacco Use   Smoking status: Former    Types: E-cigarettes   Smokeless tobacco: Never   Tobacco comments:    09/16/20 pt states she smoked half a cigarrete yesterday.    Stopped cigarettes in 2022  Vaping Use   Vaping status: Every Day   Substances: Nicotine, Flavoring  Substance and Sexual Activity   Alcohol use: Yes    Comment: rarely   Drug use: Not Currently    Types: Marijuana    Comment: one time smoke marijuana end March 2023   Sexual activity: Yes    Partners: Male    Birth control/protection: Pill  Other Topics Concern   Not on file  Social History Narrative   Not on file   Social Drivers of Health   Financial Resource Strain: Low Risk  (  01/25/2024)   Overall Financial Resource Strain (CARDIA)    Difficulty of Paying Living Expenses: Not hard at all  Food Insecurity: Food Insecurity Present (01/25/2024)   Hunger Vital Sign    Worried About Running Out of Food in the Last Year: Sometimes true    Ran Out of Food in the Last Year: Never true  Transportation Needs: No Transportation Needs (01/25/2024)   PRAPARE - Administrator, Civil Service (Medical): No    Lack of Transportation (Non-Medical): No  Physical Activity: Insufficiently Active (01/25/2024)   Exercise Vital Sign    Days of Exercise per Week: 2 days    Minutes of Exercise per Session: 30 min  Stress: No Stress  Concern Present (01/25/2024)   [employer] of Occupational Health - Occupational Stress Questionnaire    Feeling of Stress : Only a little  Social Connections: Moderately Isolated (01/25/2024)   Social Connection and Isolation Panel [NHANES]    Frequency of Communication with Friends and Family: More than three times a week    Frequency of Social Gatherings with Friends and Family: Once a week    Attends Religious Services: Never    Database administrator or Organizations: No    Attends Engineer, structural: Not on file    Marital Status: Living with partner  Intimate Partner Violence: Not At Risk (04/27/2021)   Humiliation, Afraid, Rape, and Kick questionnaire    Fear of Current or Ex-Partner: No    Emotionally Abused: No    Physically Abused: No    Sexually Abused: No    Current Outpatient Medications on File Prior to Visit  Medication Sig Dispense Refill   FLUoxetine (PROZAC) 10 MG tablet TAKE ONE TABLET BY MOUTH DAILY STARTING AT ONE WEEK PRIOR TO MENSTRUAL CYCLE AND THEN STOP ONCE MENSTRUAL CYCLE COMPLETE 90 tablet 1   meloxicam (MOBIC) 15 MG tablet Take 1 tablet (15 mg total) by mouth daily. 30 tablet 0   norgestimate-ethinyl estradiol (ORTHO-CYCLEN) 0.25-35 MG-MCG tablet Take 1 tablet by mouth daily. 84 tablet 3   traZODone (DESYREL) 50 MG tablet TAKE 0.5-1 TABLETS BY MOUTH AT BEDTIME AS NEEDED FOR SLEEP. 90 tablet 1   No current facility-administered medications on file prior to visit.    No Known Allergies   Review of Systems Constitutional: negative for chills, fatigue, fevers and sweats Positive for weight gain.  Eyes: negative for irritation, redness and visual disturbance Ears, nose, mouth, throat, and face: negative for hearing loss, nasal congestion, snoring and tinnitus Respiratory: negative for asthma, cough, sputum Cardiovascular: negative for chest pain, dyspnea, exertional chest pressure/discomfort, irregular heart beat, palpitations and  syncope Gastrointestinal: negative for abdominal pain, change in bowel habits, nausea and vomiting Genitourinary: negative for abnormal menstrual periods, genital lesions, sexual problems and vaginal discharge, dysuria and urinary incontinence Integument/breast: negative for breast lump, breast tenderness and nipple discharge Hematologic/lymphatic: negative for bleeding and easy bruising Musculoskeletal:negative for back pain and muscle weakness Neurological: negative for dizziness, headaches, vertigo and weakness Endocrine: negative for diabetic symptoms including polydipsia, polyuria and skin dryness Allergic/Immunologic: negative for hay fever and urticaria      Objective:  Blood pressure 122/74, pulse 77, height 5\' 3"  (1.6 m), weight 195 lb 12.8 oz (88.8 kg), last menstrual period 02/23/2024.  Body mass index is 34.68 kg/m.    General Appearance:    Alert, cooperative, no distress, appears stated age, mild obesity  Head:    Normocephalic, without obvious abnormality, atraumatic  Eyes:  PERRL, conjunctiva/corneas clear, EOM's intact, both eyes  Ears:    Normal external ear canals, both ears  Nose:   Nares normal, septum midline, mucosa normal, no drainage or sinus tenderness  Throat:   Lips, mucosa, and tongue normal; teeth and gums normal  Neck:   Supple, symmetrical, trachea midline, no adenopathy; thyroid: no enlargement/tenderness/nodules; no carotid bruit or JVD  Back:     Symmetric, no curvature, ROM normal, no CVA tenderness  Lungs:     Clear to auscultation bilaterally, respirations unlabored  Chest Wall:    No tenderness or deformity   Heart:    Regular rate and rhythm, S1 and S2 normal, no murmur, rub or gallop  Breast Exam:    No tenderness, masses, or nipple abnormality  Abdomen:     Soft, non-tender, bowel sounds active all four quadrants, no masses, no organomegaly.    Genitalia:    Pelvic:external genitalia normal, vagina without lesions, discharge, or tenderness,  rectovaginal septum  normal. Cervix normal in appearance, no cervical motion tenderness, no adnexal masses or tenderness.  Uterus normal size, shape, mobile, regular contours, nontender.  Rectal:    Normal external sphincter.  No hemorrhoids appreciated. Internal exam not done.   Extremities:   Extremities normal, atraumatic, no cyanosis or edema  Pulses:   2+ and symmetric all extremities  Skin:   Skin color, texture, turgor normal, no rashes or lesions  Lymph nodes:   Cervical, supraclavicular, and axillary nodes normal  Neurologic:   CNII-XII intact, normal strength, sensation and reflexes throughout   .  Labs:  Lab Results  Component Value Date   WBC 9.9 01/25/2024   HGB 13.6 01/25/2024   HCT 39.8 01/25/2024   MCV 94 01/25/2024   PLT 357 01/25/2024    Lab Results  Component Value Date   CREATININE 0.69 01/25/2024   BUN 7 01/25/2024   NA 138 01/25/2024   K 4.4 01/25/2024   CL 105 01/25/2024   CO2 23 01/25/2024    Lab Results  Component Value Date   ALT 15 01/25/2024   AST 13 01/25/2024   ALKPHOS 64 01/25/2024   BILITOT <0.2 01/25/2024    Lab Results  Component Value Date   TSH 1.480 01/25/2024   Lab Results  Component Value Date   CHOL 194 05/16/2023   HDL 53 05/16/2023   LDLCALC 118 (H) 05/16/2023   TRIG 129 05/16/2023    Lab Results  Component Value Date   HGBA1C 5.4 01/25/2024      Assessment:   1. Encounter for well woman exam with routine gynecological exam   2. Elevated LDL cholesterol level   3. Obesity (BMI 30.0-34.9)   4. Cervical cancer screening      Plan:  - Blood tests: up to date. -Breast self exam technique reviewed and patient encouraged to perform self-exam monthly. - Contraception: OCP (estrogen/progesterone). Discussed use of back up method of contraception or consideration of changing to an alternative form of contraception if she resumes GLP-1 therapy.  - Discussed healthy lifestyle modifications. - Pap smear ordered. -  Reviewed GLP-1 therapy, expectations. Discussed side effects of GI upset. Prescribed Wegovy as this was covered previously. Also prescribe Zofran to take on week of changing doses to help with nausea.  - Follow up in 1 year for annual exam. Follow up in 3 months for weight check.    Hildred Laser, MD [facility]

## 2024-03-24 LAB — CYTOLOGY - PAP: Diagnosis: NEGATIVE

## 2024-03-25 ENCOUNTER — Encounter: Payer: Self-pay | Admitting: Obstetrics and Gynecology

## 2024-03-28 ENCOUNTER — Telehealth: Payer: Self-pay

## 2024-04-02 ENCOUNTER — Ambulatory Visit: Admitting: Podiatry

## 2024-04-02 ENCOUNTER — Ambulatory Visit (INDEPENDENT_AMBULATORY_CARE_PROVIDER_SITE_OTHER)

## 2024-04-02 ENCOUNTER — Encounter: Payer: Self-pay | Admitting: Podiatry

## 2024-04-02 VITALS — Ht 63.0 in | Wt 195.0 lb

## 2024-04-02 DIAGNOSIS — M7752 Other enthesopathy of left foot: Secondary | ICD-10-CM | POA: Diagnosis not present

## 2024-04-02 DIAGNOSIS — M7751 Other enthesopathy of right foot: Secondary | ICD-10-CM

## 2024-04-02 DIAGNOSIS — M722 Plantar fascial fibromatosis: Secondary | ICD-10-CM | POA: Diagnosis not present

## 2024-04-02 DIAGNOSIS — M62462 Contracture of muscle, left lower leg: Secondary | ICD-10-CM

## 2024-04-02 DIAGNOSIS — M62461 Contracture of muscle, right lower leg: Secondary | ICD-10-CM

## 2024-04-02 MED ORDER — MELOXICAM 15 MG PO TABS: 15.0000 mg | ORAL_TABLET | Freq: Every day | ORAL | 0 refills | Status: DC

## 2024-04-02 MED ORDER — PREDNISONE 10 MG PO TABS: ORAL_TABLET | ORAL | 0 refills | Status: AC

## 2024-04-02 NOTE — Patient Instructions (Signed)
 VISIT SUMMARY: You came in today because of constant pain in both of your feet, particularly in the heels and the bottom of your toes. You rated the pain as six out of ten. 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 You have been taking meloxicam  for the past three weeks, which initially helped but the pain has started to return. You also tried arch supports in the past, but they made your symptoms worse.  YOUR PLAN: -PLANTAR FASCIITIS: Plantar fasciitis is a condition that causes pain in the heel and bottom of the foot due to inflammation of the plantar fascia, a thick band of tissue that runs across the bottom of your foot. This condition is often caused by overuse or inadequate footwear support. To help manage your symptoms, you will start a one-week course of prednisone  to reduce acute inflammation and continue taking meloxicam  15 mg daily for one month. You are also advised to use arch support insoles such as Powerstep, Superfeet, or Protalus (Or [place] for a cork based softer insoles), and to visit a running shoe store like [employer] for proper insole fitting. Additionally, you should begin therapy exercises to address tight calf and foot muscles.  INSTRUCTIONS: Please follow up with us  after completing the one-week course of prednisone  and the one-month course of meloxicam  to reassess your symptoms and progress. If your pain persists or worsens, contact our office for further evaluation.                      Contains text generated by [employer]bridge.                                 Contains text generated by [employer]bridge.    Plantar Fasciitis (Heel Spur Syndrome) with Rehab The plantar fascia is a fibrous, ligament-like, soft-tissue structure that spans the bottom of the foot. Plantar fasciitis is a condition that causes pain in the foot due to inflammation  of the tissue. SYMPTOMS  Pain and tenderness on the underneath side of the foot. Pain that worsens with standing or walking. CAUSES  Plantar fasciitis is caused by irritation and injury to the plantar fascia on the underneath side of the foot. Common mechanisms of injury include: Direct trauma to bottom of the foot. Damage to a small nerve that runs under the foot where the main fascia attaches to the heel bone. Stress placed on the plantar fascia due to bone spurs. RISK INCREASES WITH:  Activities that place stress on the plantar fascia (running, jumping, pivoting, or cutting). Poor strength and flexibility. Improperly fitted shoes. Tight calf muscles. Flat feet. Failure to warm-up properly before activity. Obesity. PREVENTION Warm up and stretch properly before activity. Allow for adequate recovery between workouts. Maintain physical fitness: Strength, flexibility, and endurance. Cardiovascular fitness. Maintain a health body weight. Avoid stress on the plantar fascia. Wear properly fitted shoes, including arch supports for individuals who have flat feet.  PROGNOSIS  If treated properly, then the symptoms of plantar fasciitis usually resolve without surgery. However, occasionally surgery is necessary.  RELATED COMPLICATIONS  Recurrent symptoms that may result in a chronic condition. Problems of the lower back that are caused by compensating for the injury, such as limping. Pain or weakness of the foot during push-off following surgery. Chronic inflammation, scarring, and partial or complete fascia tear, occurring  more often from repeated injections.  TREATMENT  Treatment initially involves the use of ice and medication to help reduce pain and inflammation. The use of strengthening and stretching exercises may help reduce pain with activity, especially stretches of the Achilles tendon. These exercises may be performed at home or with a therapist. Your caregiver may recommend  that you use heel cups of arch supports to help reduce stress on the plantar fascia. Occasionally, corticosteroid injections are given to reduce inflammation. If symptoms persist for greater than 6 months despite non-surgical (conservative), then surgery may be recommended.   MEDICATION  If pain medication is necessary, then nonsteroidal anti-inflammatory medications, such as aspirin and ibuprofen , or other minor pain relievers, such as acetaminophen , are often recommended. Do not take pain medication within 7 days before surgery. Prescription pain relievers may be given if deemed necessary by your caregiver. Use only as directed and only as much as you need. Corticosteroid injections may be given by your caregiver. These injections should be reserved for the most serious cases, because they may only be given a certain number of times.  HEAT AND COLD Cold treatment (icing) relieves pain and reduces inflammation. Cold treatment should be applied for 10 to 15 minutes every 2 to 3 hours for inflammation and pain and immediately after any activity that aggravates your symptoms. Use ice packs or massage the area with a piece of ice (ice massage). Heat treatment may be used prior to performing the stretching and strengthening activities prescribed by your caregiver, physical therapist, or athletic trainer. Use a heat pack or soak the injury in warm water.  SEEK IMMEDIATE MEDICAL CARE IF: Treatment seems to offer no benefit, or the condition worsens. Any medications produce adverse side effects.  EXERCISES- RANGE OF MOTION (ROM) AND STRETCHING EXERCISES - Plantar Fasciitis (Heel Spur Syndrome) These exercises may help you when beginning to rehabilitate your injury. Your symptoms may resolve with or without further involvement from your physician, physical therapist or athletic trainer. While completing these exercises, remember:  Restoring tissue flexibility helps normal motion to return to the joints.  This allows healthier, less painful movement and activity. An effective stretch should be held for at least 30 seconds. A stretch should never be painful. You should only feel a gentle lengthening or release in the stretched tissue.  RANGE OF MOTION - Toe Extension, Flexion Sit with your right / left leg crossed over your opposite knee. Grasp your toes and gently pull them back toward the top of your foot. You should feel a stretch on the bottom of your toes and/or foot. Hold this stretch for 10 seconds. Now, gently pull your toes toward the bottom of your foot. You should feel a stretch on the top of your toes and or foot. Hold this stretch for 10 seconds. Repeat  times. Complete this stretch 3 times per day.   RANGE OF MOTION - Ankle Dorsiflexion, Active Assisted Remove shoes and sit on a chair that is preferably not on a carpeted surface. Place right / left foot under knee. Extend your opposite leg for support. Keeping your heel down, slide your right / left foot back toward the chair until you feel a stretch at your ankle or calf. If you do not feel a stretch, slide your bottom forward to the edge of the chair, while still keeping your heel down. Hold this stretch for 10 seconds. Repeat 3 times. Complete this stretch 2 times per day.   STRETCH  Gastroc, Standing Place hands  on wall. Extend right / left leg, keeping the front knee somewhat bent. Slightly point your toes inward on your back foot. Keeping your right / left heel on the floor and your knee straight, shift your weight toward the wall, not allowing your back to arch. You should feel a gentle stretch in the right / left calf. Hold this position for 10 seconds. Repeat 3 times. Complete this stretch 2 times per day.  STRETCH  Soleus, Standing Place hands on wall. Extend right / left leg, keeping the other knee somewhat bent. Slightly point your toes inward on your back foot. Keep your right / left heel on the floor, bend  your back knee, and slightly shift your weight over the back leg so that you feel a gentle stretch deep in your back calf. Hold this position for 10 seconds. Repeat 3 times. Complete this stretch 2 times per day.  STRETCH  Gastrocsoleus, Standing  Note: This exercise can place a lot of stress on your foot and ankle. Please complete this exercise only if specifically instructed by your caregiver.  Place the ball of your right / left foot on a step, keeping your other foot firmly on the same step. Hold on to the wall or a rail for balance. Slowly lift your other foot, allowing your body weight to press your heel down over the edge of the step. You should feel a stretch in your right / left calf. Hold this position for 10 seconds. Repeat this exercise with a slight bend in your right / left knee. Repeat 3 times. Complete this stretch 2 times per day.   STRENGTHENING EXERCISES - Plantar Fasciitis (Heel Spur Syndrome)  These exercises may help you when beginning to rehabilitate your injury. They may resolve your symptoms with or without further involvement from your physician, physical therapist or athletic trainer. While completing these exercises, remember:  Muscles can gain both the endurance and the strength needed for everyday activities through controlled exercises. Complete these exercises as instructed by your physician, physical therapist or athletic trainer. Progress the resistance and repetitions only as guided.  STRENGTH - Towel Curls Sit in a chair positioned on a non-carpeted surface. Place your foot on a towel, keeping your heel on the floor. Pull the towel toward your heel by only curling your toes. Keep your heel on the floor. Repeat 3 times. Complete this exercise 2 times per day.  STRENGTH - Ankle Inversion Secure one end of a rubber exercise band/tubing to a fixed object (table, pole). Loop the other end around your foot just before your toes. Place your fists between your  knees. This will focus your strengthening at your ankle. Slowly, pull your big toe up and in, making sure the band/tubing is positioned to resist the entire motion. Hold this position for 10 seconds. Have your muscles resist the band/tubing as it slowly pulls your foot back to the starting position. Repeat 3 times. Complete this exercises 2 times per day.  Document Released: 11/20/2005 Document Revised: 02/12/2012 Document Reviewed: 03/04/2009 [facility] Patient Information 2014 [place], [place].

## 2024-04-02 NOTE — Telephone Encounter (Signed)
 [facility] [address] [place], [place] [address] 03/28/2024 Reference Number: [id number]  Dear Teresa Fender,   Enclosed is your copy of the Adverse Benefit Determination for your records. Only the Requesting Provider may call the Medical Director who made this decision at  [phone] to schedule a Peer-to-Peer Review. You have the right to examine your case file at  no charge, including medical records, guidelines, protocol or criteria, and any other documents and  records considered during the review process. Advise if you would like to schedule a Peer-to-Peer on  this denied Pharmacy request. Be sure to provide Member Name, Member ID, Provider Name,  Requested Drug, Call/Fax back information. There are two kinds of appeals with [facility]

## 2024-04-02 NOTE — Progress Notes (Signed)
  Subjective:  Patient ID: Danielle Gillespie, female    DOB: 23-Mar-1991,  MRN: [id number]  Chief Complaint  Patient presents with   Foot Pain    RM1: patient is here for bilateral foot pain in heel of foot and ball of foot throbbing and sometimes stabbing constant pain since December    Discussed the use of AI scribe software for clinical note transcription with the patient, who gave verbal consent to proceed.  History of Present Illness Danielle Gillespie is a 32 year old female who presents with bilateral foot pain.  She experiences constant pain in both feet, specifically in the heels and the bottom of her toes, rated as six out of ten. No recent injuries are reported.  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 She typically wears tennis shoes and replaces them every two months due to odor issues, noting that her feet tend to 'stink pretty quickly.'  She has been taking meloxicam  15 mg daily for the past three weeks, which initially alleviated her symptoms completely, but the pain has started to return. She has about seven days of medication left from a thirty-day supply.  She has tried using arch supports in the past, but found that they worsened her symptoms. She is unsure of the brand but recalls that they were quite firm.  Soreness upon palpation of the feet is noted. No recent injuries.      Objective:    Physical Exam VASCULAR: DP and PT pulse palpable. Foot is warm and well-perfused. Capillary fill time is brisk. DERMATOLOGIC: Normal skin turgor, texture, and temperature. No open lesions, rashes, or ulcerations. NEUROLOGIC: Normal sensation to light touch and pressure. No paresthesias. ORTHOPEDIC: Mild pain on palpation at plantar medial heel bilaterally. Significant gastrocnemius equinus and diffuse tenderness across the ball of the foot. Calf muscles are tight. Smooth pain-free range of motion of  all examined joints. No ecchymosis or bruising. No gross deformity.   No images are attached to the encounter.    Results RADIOLOGY Bilateral foot X-ray: No stress fracture, fracture, or significant heel spur (04/02/2024)   Assessment:   1. Plantar fasciitis, left   2. Plantar fasciitis, right   3. Gastrocnemius equinus of left lower extremity   4. Gastrocnemius equinus of right lower extremity      Plan:  Patient was evaluated and treated and all questions answered.  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 Significant gastrocnemius equinus contributes to plantar fascia strain. Previous meloxicam  use provided temporary relief, but symptoms have recurred. Radiographs show no stress fracture or major heel spur. Likely due to overuse and inadequate footwear support, necessitating improved arch support and muscle therapy. - Prescribe a one-week course of prednisone  for acute inflammation. - Continue meloxicam  15 mg daily for one month. - Recommend arch support insoles such as Powerstep, Superfeet, or Protailus. - Advise visiting a running shoe store like [employer] for appropriate insole fitting. - Initiate therapy exercises to address tight calf and foot muscles.      No follow-ups on file.

## 2024-05-15 ENCOUNTER — Encounter: Payer: Self-pay | Admitting: Nurse Practitioner

## 2024-05-16 MED ORDER — FLUCONAZOLE 150 MG PO TABS: 150.0000 mg | ORAL_TABLET | Freq: Every day | ORAL | 0 refills | Status: DC

## 2024-06-07 NOTE — Patient Instructions (Incomplete)
 Be Involved in Caring For Your Health:  Taking Medications When medications are taken as directed, they can greatly improve your health. But if they are not taken as prescribed, they may not work. In some cases, not taking them correctly can be harmful. To help ensure your treatment remains effective and safe, understand your medications and how to take them. Bring your medications to each visit for review by your provider.  Your lab results, notes, and after visit summary will be available on My Chart. We strongly encourage you to use this feature. If lab results are abnormal the clinic will contact you with the appropriate steps. If the clinic does not contact you assume the results are satisfactory. You can always view your results on My Chart. If you have questions regarding your health or results, please contact the clinic during office hours. You can also ask questions on My Chart.  We at [facility] are grateful that you chose [place] to provide your care. We strive to provide evidence-based and compassionate care and are always looking for feedback. If you get a survey from the clinic please complete this so we can hear your opinions.  Healthy Eating, Adult Healthy eating may help you get and keep a healthy body weight, reduce the risk of chronic disease, and live a long and productive life. It is important to follow a healthy eating pattern. Your nutritional and calorie needs should be met mainly by different nutrient-rich foods. What are tips for following this plan? Reading food labels Read labels and choose the following: Reduced or low sodium products. Juices with 100% fruit juice. Foods with low saturated fats (<3 g per serving) and high polyunsaturated and monounsaturated fats. Foods with whole grains, such as whole wheat, cracked wheat, brown rice, and wild rice. Whole grains that are fortified with folic acid. This is recommended for females who are pregnant or who want  to become pregnant. Read labels and do not eat or drink the following: Foods or drinks with added sugars. These include foods that contain brown sugar, corn sweetener, corn syrup, dextrose, fructose, glucose, high-fructose corn syrup, honey, invert sugar, lactose, malt syrup, maltose, molasses, raw sugar, sucrose, trehalose, or turbinado sugar. Limit your intake of added sugars to less than 10% of your total daily calories. Do not eat more than the following amounts of added sugar per day: 6 teaspoons (25 g) for females. 9 teaspoons (38 g) for males. Foods that contain processed or refined starches and grains. Refined grain products, such as white flour, degermed cornmeal, white bread, and white rice. Shopping Choose nutrient-rich snacks, such as vegetables, whole fruits, and nuts. Avoid high-calorie and high-sugar snacks, such as potato chips, fruit snacks, and candy. Use oil-based dressings and spreads on foods instead of solid fats such as butter, margarine, sour cream, or cream cheese. Limit pre-made sauces, mixes, and "instant" products such as flavored rice, instant noodles, and ready-made pasta. Try more plant-protein sources, such as tofu, tempeh, black beans, edamame, lentils, nuts, and seeds. Explore eating plans such as the Mediterranean diet or vegetarian diet. Try heart-healthy dips made with beans and healthy fats like hummus and guacamole. Vegetables go great with these. Cooking Use oil to saut or stir-fry foods instead of solid fats such as butter, margarine, or lard. Try baking, boiling, grilling, or broiling instead of frying. Remove the fatty part of meats before cooking. Steam vegetables in water or broth. Meal planning  At meals, imagine dividing your plate into fourths: One-half of  your plate is fruits and vegetables. One-fourth of your plate is whole grains. One-fourth of your plate is protein, especially lean meats, poultry, eggs, tofu, beans, or nuts. Include  low-fat dairy as part of your daily diet. Lifestyle Choose healthy options in all settings, including home, work, school, restaurants, or stores. Prepare your food safely: Wash your hands after handling raw meats. Where you prepare food, keep surfaces clean by regularly washing with hot, soapy water. Keep raw meats separate from ready-to-eat foods, such as fruits and vegetables. Cook seafood, meat, poultry, and eggs to the recommended temperature. Get a food thermometer. Store foods at safe temperatures. In general: Keep cold foods at 76F (4.4C) or below. Keep hot foods at 176F (60C) or above. Keep your freezer at [facility] (-17.8C) or below. Foods are not safe to eat if they have been between the temperatures of 40-176F (4.4-60C) for more than 2 hours. What foods should I eat? Fruits Aim to eat 1-2 cups of fresh, canned (in natural juice), or frozen fruits each day. One cup of fruit equals 1 small apple, 1 large banana, 8 large strawberries, 1 cup (237 g) canned fruit,  cup (82 g) dried fruit, or 1 cup (240 mL) 100% juice. Vegetables Aim to eat 2-4 cups of fresh and frozen vegetables each day, including different varieties and colors. One cup of vegetables equals 1 cup (91 g) broccoli or cauliflower florets, 2 medium carrots, 2 cups (150 g) raw, leafy greens, 1 large tomato, 1 large bell pepper, 1 large sweet potato, or 1 medium white potato. Grains Aim to eat 5-10 ounce-equivalents of whole grains each day. Examples of 1 ounce-equivalent of grains include 1 slice of bread, 1 cup (40 g) ready-to-eat cereal, 3 cups (24 g) popcorn, or  cup (93 g) cooked rice. Meats and other proteins Try to eat 5-7 ounce-equivalents of protein each day. Examples of 1 ounce-equivalent of protein include 1 egg,  oz nuts (12 almonds, 24 pistachios, or 7 walnut halves), 1/4 cup (90 g) cooked beans, 6 tablespoons (90 g) hummus or 1 tablespoon (16 g) peanut butter. A cut of meat or fish that is the size of a deck  of cards is about 3-4 ounce-equivalents (85 g). Of the protein you eat each week, try to have at least 8 sounce (227 g) of seafood. This is about 2 servings per week. This includes salmon, trout, herring, sardines, and anchovies. Dairy Aim to eat 3 cup-equivalents of fat-free or low-fat dairy each day. Examples of 1 cup-equivalent of dairy include 1 cup (240 mL) milk, 8 ounces (250 g) yogurt, 1 ounces (44 g) natural cheese, or 1 cup (240 mL) fortified soy milk. Fats and oils Aim for about 5 teaspoons (21 g) of fats and oils per day. Choose monounsaturated fats, such as canola and olive oils, mayonnaise made with olive oil or avocado oil, avocados, peanut butter, and most nuts, or polyunsaturated fats, such as sunflower, corn, and soybean oils, walnuts, pine nuts, sesame seeds, sunflower seeds, and flaxseed. Beverages Aim for 6 eight-ounce glasses of water per day. Limit coffee to 3-5 eight-ounce cups per day. Limit caffeinated beverages that have added calories, such as soda and energy drinks. If you drink alcohol: Limit how much you have to: 0-1 drink a day if you are female. 0-2 drinks a day if you are female. Know how much alcohol is in your drink. In the [place] one drink is one 12 oz bottle of beer (355 mL), one 5 oz glass of wine (  148 mL), or one 1 oz glass of hard liquor (44 mL). Seasoning and other foods Try not to add too much salt to your food. Try using herbs and spices instead of salt. Try not to add sugar to food. This information is based on [place] nutrition guidelines. To learn more, visit [employer].be. Exact amounts may vary. You may need different amounts. This information is not intended to replace advice given to you by your health care provider. Make sure you discuss any questions you have with your health care provider. Document Revised: 08/21/2022 Document Reviewed: 08/21/2022 Elsevier Patient Education  2024 [employer].

## 2024-06-11 ENCOUNTER — Encounter: Payer: Self-pay | Admitting: Nurse Practitioner

## 2024-06-11 DIAGNOSIS — D72828 Other elevated white blood cell count: Secondary | ICD-10-CM

## 2024-06-11 DIAGNOSIS — E559 Vitamin D deficiency, unspecified: Secondary | ICD-10-CM

## 2024-06-11 DIAGNOSIS — Z720 Tobacco use: Secondary | ICD-10-CM

## 2024-06-11 DIAGNOSIS — E538 Deficiency of other specified B group vitamins: Secondary | ICD-10-CM

## 2024-06-11 DIAGNOSIS — F3281 Premenstrual dysphoric disorder: Secondary | ICD-10-CM

## 2024-06-11 DIAGNOSIS — F5101 Primary insomnia: Secondary | ICD-10-CM

## 2024-06-11 DIAGNOSIS — Z0000 Encounter for general adult medical examination without abnormal findings: Secondary | ICD-10-CM

## 2024-06-11 DIAGNOSIS — Z833 Family history of diabetes mellitus: Secondary | ICD-10-CM

## 2024-06-11 DIAGNOSIS — E7800 Pure hypercholesterolemia, unspecified: Secondary | ICD-10-CM

## 2024-06-23 ENCOUNTER — Ambulatory Visit (INDEPENDENT_AMBULATORY_CARE_PROVIDER_SITE_OTHER): Admitting: Podiatry

## 2024-06-23 ENCOUNTER — Encounter: Payer: Self-pay | Admitting: Podiatry

## 2024-06-23 DIAGNOSIS — M722 Plantar fascial fibromatosis: Secondary | ICD-10-CM

## 2024-06-23 MED ORDER — DICLOFENAC SODIUM 75 MG PO TBEC: 75.0000 mg | DELAYED_RELEASE_TABLET | Freq: Two times a day (BID) | ORAL | 0 refills | Status: DC

## 2024-06-23 NOTE — Progress Notes (Addendum)
  Subjective:  Patient ID: Danielle Gillespie, female    DOB: Jan 01, 1991,  MRN: [id number]  Chief Complaint  Patient presents with   Foot Pain    RM Patient is here for steroid shots for both feet. Patient states a stabbing pain (7 out of 10) when standing/walking and a throbbing. pain at rest.      History of Present Illness Has only had slight improvement using the meloxicam       Objective:    Physical Exam VASCULAR: DP and PT pulse palpable. Foot is warm and well-perfused. Capillary fill time is brisk. DERMATOLOGIC: Normal skin turgor, texture, and temperature. No open lesions, rashes, or ulcerations. NEUROLOGIC: Normal sensation to light touch and pressure. No paresthesias. ORTHOPEDIC: Mild pain on palpation at plantar medial heel bilaterally. Significant gastrocnemius equinus and diffuse tenderness across the ball of the foot. Calf muscles are tight. Smooth pain-free range of motion of all examined joints. No ecchymosis or bruising. No gross deformity.   No images are attached to the encounter.    Results RADIOLOGY Bilateral foot X-ray: No stress fracture, fracture, or significant heel spur (04/02/2024)   Assessment:   1. Plantar fasciitis, left   2. Plantar fasciitis, right      Plan:  Patient was evaluated and treated and all questions answered.  Assessment and Plan Assessment & Plan Plantar fasciitis Recommend therapy and exercises given today.  Switch to diclofenac  75 mg twice daily.  Injection performed today as noted below.  Follow-up in 1 month if not improved we will plan for boot formal PT possible MRI   After sterile prep with povidone-iodine solution and alcohol, the bilateral heel was injected with 0.5cc 2% xylocaine  plain, 0.5cc 0.5% marcaine  plain, 20 mg triamcinolone  acetonide, and 4 mg dexamethasone  was injected along the medial plantar fascia at the insertion on the plantar calcaneus. The patient tolerated the procedure well without  complication.    Return in about 1 month (around 07/24/2024) for recheck plantar fasciitis.

## 2024-06-23 NOTE — Patient Instructions (Signed)

## 2024-06-27 ENCOUNTER — Telehealth: Payer: Self-pay

## 2024-06-27 NOTE — Telephone Encounter (Signed)
 PA request received from [employer] for Diclofenac  sodium 75mg  tablet. PA submitted and waiting on response.  Danielle Gillespie  (Key: [id number]) Rx #: [phone]

## 2024-07-04 NOTE — Telephone Encounter (Signed)
 PA was denied

## 2024-07-21 ENCOUNTER — Ambulatory Visit (INDEPENDENT_AMBULATORY_CARE_PROVIDER_SITE_OTHER): Admitting: Podiatry

## 2024-07-21 DIAGNOSIS — M722 Plantar fascial fibromatosis: Secondary | ICD-10-CM

## 2024-07-21 NOTE — Progress Notes (Signed)
  Subjective:  Patient ID: Danielle Gillespie, female    DOB: 14-Mar-1991,  MRN: [id number]  Chief Complaint  Patient presents with   Plantar Fasciitis    Rm 7 Patient is here for a follow-up plantar fascitis. Patient states injections helped with pain for the past three weeks. Patient would like a steroid injection today.      History of Present Illness Last injection was helpful started to wear off last week      Objective:    Physical Exam VASCULAR: DP and PT pulse palpable. Foot is warm and well-perfused. Capillary fill time is brisk. DERMATOLOGIC: Normal skin turgor, texture, and temperature. No open lesions, rashes, or ulcerations. NEUROLOGIC: Normal sensation to light touch and pressure. No paresthesias. ORTHOPEDIC: Mild pain on palpation at plantar medial heel bilaterally, seems to be worse on the right foot. Significant gastrocnemius equinus and diffuse tenderness across the ball of the foot. Calf muscles are tight. Smooth pain-free range of motion of all examined joints. No ecchymosis or bruising. No gross deformity.   No images are attached to the encounter.    Results RADIOLOGY Bilateral foot X-ray: No stress fracture, fracture, or significant heel spur (04/02/2024)   Assessment:   1. Plantar fasciitis, left   2. Plantar fasciitis, right       Plan:  Patient was evaluated and treated and all questions answered.  Assessment and Plan Assessment & Plan Plantar fasciitis Last injection was quite helpful.  She requested additional injection today.  We discussed this is reasonable and low risk of rupture but is still not completely improved after this injection then plan for boot MRI and PT.  Follow-up in 1 month   After sterile prep with povidone-iodine solution and alcohol, the bilateral heel was injected with 0.5cc 2% xylocaine  plain, 0.5cc 0.5% marcaine  plain, 20 mg triamcinolone  acetonide, and 4 mg dexamethasone  was injected along the medial plantar fascia at the  insertion on the plantar calcaneus. The patient tolerated the procedure well without complication.    No follow-ups on file.

## 2024-07-23 ENCOUNTER — Ambulatory Visit: Admitting: Podiatry

## 2024-07-28 ENCOUNTER — Encounter: Payer: Self-pay | Admitting: Podiatry

## 2024-07-28 MED ORDER — MELOXICAM 15 MG PO TABS
15.0000 mg | ORAL_TABLET | Freq: Every day | ORAL | 3 refills | Status: AC
Start: 2024-07-28 — End: ?

## 2024-08-27 ENCOUNTER — Ambulatory Visit: Admitting: Podiatry

## 2024-09-01 ENCOUNTER — Ambulatory Visit (INDEPENDENT_AMBULATORY_CARE_PROVIDER_SITE_OTHER): Admitting: Podiatry

## 2024-09-01 VITALS — Ht 63.0 in | Wt 195.0 lb

## 2024-09-01 DIAGNOSIS — M722 Plantar fascial fibromatosis: Secondary | ICD-10-CM

## 2024-09-02 NOTE — Progress Notes (Signed)
  Subjective:  Patient ID: Danielle Gillespie, female    DOB: 02-May-1991,  MRN: [id number]  Chief Complaint  Patient presents with   Plantar Fasciitis    Rm 7 Patient is here to f/u on PF of the left foot. Patient states a 60-70% improvement in pain. Pt is currently wearing [employer] brand sandals for support/ pain relief.      History of Present Illness Not 100% better but has had improvement, the shoe changes have helped quite a bit.      Objective:    Physical Exam VASCULAR: DP and PT pulse palpable. Foot is warm and well-perfused. Capillary fill time is brisk. DERMATOLOGIC: Normal skin turgor, texture, and temperature. No open lesions, rashes, or ulcerations. NEUROLOGIC: Normal sensation to light touch and pressure. No paresthesias. ORTHOPEDIC: Mild pain on palpation at plantar medial heel bilaterally, seems to be worse on the right foot. Significant gastrocnemius equinus and diffuse tenderness across the ball of the foot. Calf muscles are tight. Smooth pain-free range of motion of all examined joints. No ecchymosis or bruising. No gross deformity.   No images are attached to the encounter.    Results RADIOLOGY Bilateral foot X-ray: No stress fracture, fracture, or significant heel spur (04/02/2024)   Assessment:   1. Plantar fasciitis, left   2. Plantar fasciitis, right        Plan:  Patient was evaluated and treated and all questions answered.  Assessment and Plan Assessment & Plan Plantar fasciitis Improved after the last injection.  Do not think she needs boot or MRI at this point I do think physical therapy was to be beneficial especially for reduction of her equinus and tightness of the calf muscle contributing to the plantar fasciitis.  Referral was placed for this.  She will continue her home exercise plan in the interim.  Follow-up with me in 2 months for final evaluation.        No follow-ups on file.

## 2024-09-09 ENCOUNTER — Encounter: Payer: Self-pay | Admitting: Podiatry

## 2024-09-09 ENCOUNTER — Ambulatory Visit: Admitting: Podiatry

## 2024-09-10 ENCOUNTER — Ambulatory Visit (INDEPENDENT_AMBULATORY_CARE_PROVIDER_SITE_OTHER)

## 2024-09-10 ENCOUNTER — Ambulatory Visit

## 2024-09-10 DIAGNOSIS — M79671 Pain in right foot: Secondary | ICD-10-CM

## 2024-09-10 DIAGNOSIS — M722 Plantar fascial fibromatosis: Secondary | ICD-10-CM

## 2024-09-10 DIAGNOSIS — M62461 Contracture of muscle, right lower leg: Secondary | ICD-10-CM

## 2024-09-10 DIAGNOSIS — M629 Disorder of muscle, unspecified: Secondary | ICD-10-CM

## 2024-09-10 DIAGNOSIS — M62462 Contracture of muscle, left lower leg: Secondary | ICD-10-CM

## 2024-09-10 NOTE — Progress Notes (Signed)
  Subjective:  Patient ID: Danielle Gillespie, female    DOB: May 05, 1991,  MRN: [id number]  Chief Complaint  Patient presents with   Foot Pain    injurey today.right foot-patient states it felt like something burst when she was walking up hill on 09/724    Discussed the use of AI scribe software for clinical note transcription with the patient, who gave verbal consent to proceed.  History of Present Illness Danielle Gillespie is a 33 year old female with bilateral plantar fasciitis who presents with acute right foot pain after feeling a pop while walking uphill.  She experienced a sudden 'big burst' sensation in her right foot while walking uphill yesterday. The pain originated under the heel and extended to the ball of her foot, particularly under her toes. Following the incident, her foot swelled, and she was initially unable to move her toes, which felt numb. Today, she can move her toes, but the area remains tight and painful, especially when pushing down or moving the toes upward.  She has bilateral plantar fasciitis, with the right foot being more problematic. Prior to the incident, her symptoms were improving significantly. 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 She uses [employer]os sandals and recently acquired [employer]os tennis shoes for support.  Her left foot has been improving but is not completely pain-free. She has been putting more weight on the left foot since the incident with the right foot, raising concerns about exacerbating her symptoms.      Objective:    Physical Exam MUSCULOSKELETAL: Significant tenderness to palpation at the origin of the right plantar fascia. Pain with activation of windlass mechanism in the right foot. No palpable defect in the right plantar fascia, but patient unable to tolerate significant palpation. Significant tenderness extending along the medial band  to the metatarsophalangeal joints of the right foot. 5/5 muscle strength to all major pedal muscle groups, with minor pain upon plantarflexion.  No pain with side to side squeeze of calcaneus.  N/V/Derm: Within normal limits. Palpable pulses. Normal epicritic sensation. No open wounds or lesions.     Results RADIOLOGY Right foot X-ray: No fracture or abnormalities detected. Rectus foot shape with maintained joint spaces. Normal osseous mineralization.    Assessment:   1. Foot pain, right   2. Plantar fasciitis, right   3. Gastrocnemius equinus of left lower extremity   4. Gastrocnemius equinus of right lower extremity   5. Plantar fasciitis, left   6. Nontraumatic tear of plantar fascia      Plan:  Patient was evaluated and treated and all questions answered.  Assessment and Plan Assessment & Plan Right plantar fascia partial tear Partial tear of the right plantar fascia - Prescribed walking boot for immobilization and arch support until pain-free ambulation. - Provided work note for three weeks of no ambulating or walking, allowing seated work if available. - Advised gradual transition to supportive shoes with orthotics once pain subsides and she is able to walk in CAM walker painfree.  - Scheduled follow-up in three weeks for reassessment.  Bilateral plantar fasciitis Chronic bilateral plantar fasciitis with improvement in the left foot; right foot exacerbated by recent partial tear. - Encouraged rest for recovery of both feet. - Continue supportive footwear and orthotics for arch support.      No follow-ups on file.

## 2024-10-01 ENCOUNTER — Ambulatory Visit: Admitting: Podiatry

## 2024-10-01 ENCOUNTER — Encounter: Payer: Self-pay | Admitting: Podiatry

## 2024-10-01 VITALS — Ht 63.0 in | Wt 195.0 lb

## 2024-10-01 DIAGNOSIS — M629 Disorder of muscle, unspecified: Secondary | ICD-10-CM

## 2024-10-01 DIAGNOSIS — M216X1 Other acquired deformities of right foot: Secondary | ICD-10-CM

## 2024-10-01 NOTE — Patient Instructions (Signed)

## 2024-10-01 NOTE — Progress Notes (Signed)
  Subjective:  Patient ID: Danielle Gillespie, female    DOB: October 28, 1991,  MRN: [id number]  Chief Complaint  Patient presents with   Foot Pain    Right foot pain. 5/6 out of 10. Very sore in the evening. She is wearing the boot for 8/10 hours a day      History of Present Illness Returns for follow-up today has not improved very much since her visit 3 weeks ago Dr. Kennieth after feeling a pop.  She has been using the boot nearly all day and is still painful walking in the boot.      Objective:    Physical Exam VASCULAR: DP and PT pulse palpable. Foot is warm and well-perfused. Capillary fill time is brisk. DERMATOLOGIC: Normal skin turgor, texture, and temperature. No open lesions, rashes, or ulcerations. NEUROLOGIC: Normal sensation to light touch and pressure. No paresthesias. ORTHOPEDIC: Today sharp pain on the plantar medial heel on the right and in the mid plantar fascia   No images are attached to the encounter.    Results RADIOLOGY Bilateral foot X-ray: No stress fracture, fracture, or significant heel spur (04/02/2024)   Assessment:   1. Nontraumatic tear of plantar fascia        Plan:  Patient was evaluated and treated and all questions answered.  Assessment and Plan Assessment & Plan Plantar fasciitis Minimal proved with boot immobilization.  At this point with her chronic plantar fasciitis and suspicion of a plantar fascial tear I recommended an MRI to evaluate.  May require surgical intervention to complete.  Stat MRI ordered and sent referral and she will follow-up me in 4 weeks to reevaluate.        Return in about 4 weeks (around 10/29/2024).

## 2024-10-10 ENCOUNTER — Ambulatory Visit
Admission: RE | Admit: 2024-10-10 | Discharge: 2024-10-10 | Disposition: A | Discharge status: Home / Self Care | Source: Ambulatory Visit | Attending: Podiatry | Admitting: Podiatry

## 2024-10-10 DIAGNOSIS — M629 Disorder of muscle, unspecified: Secondary | ICD-10-CM

## 2024-10-13 ENCOUNTER — Encounter: Payer: Self-pay | Admitting: Podiatry

## 2024-10-29 ENCOUNTER — Ambulatory Visit: Admitting: Podiatry

## 2024-10-29 ENCOUNTER — Encounter: Payer: Self-pay | Admitting: Podiatry

## 2024-10-29 ENCOUNTER — Telehealth: Payer: Self-pay

## 2024-10-29 DIAGNOSIS — M216X1 Other acquired deformities of right foot: Secondary | ICD-10-CM | POA: Diagnosis not present

## 2024-10-29 DIAGNOSIS — M216X2 Other acquired deformities of left foot: Secondary | ICD-10-CM | POA: Diagnosis not present

## 2024-10-29 DIAGNOSIS — M722 Plantar fascial fibromatosis: Secondary | ICD-10-CM | POA: Diagnosis not present

## 2024-10-29 DIAGNOSIS — M629 Disorder of muscle, unspecified: Secondary | ICD-10-CM | POA: Diagnosis not present

## 2024-10-29 NOTE — Progress Notes (Signed)
  Subjective:  Patient ID: Danielle Gillespie, female    DOB: [birth year],  MRN: [id number]  Chief Complaint  Patient presents with   Plantar Fasciitis    follow up plantar fascitis. She has not worn her boot x 9 days. Pain scale on the right 3/10 and on the left 7/10      History of Present Illness Follow-up visit had significant improvement utilizing the boot on the right side      Objective:    Physical Exam VASCULAR: DP and PT pulse palpable. Foot is warm and well-perfused. Capillary fill time is brisk. DERMATOLOGIC: Normal skin turgor, texture, and temperature. No open lesions, rashes, or ulcerations. NEUROLOGIC: Normal sensation to light touch and pressure. No paresthesias. ORTHOPEDIC: Soreness on the plantar medial right heel, more severe on the left today   No images are attached to the encounter.    Results RADIOLOGY Bilateral foot X-ray: No stress fracture, fracture, or significant heel spur (04/02/2024)   Assessment:   1. Nontraumatic tear of plantar fascia   2. Plantar fasciitis, left   3. Pronation of left foot   4. Pronation of right foot        Plan:  Patient was evaluated and treated and all questions answered.  Assessment and Plan Assessment & Plan Plantar fasciitis Has improved after boot immobilization for plantar fascial tear, I recommend using the boot on the left side until this side resolves as well.  She will return to work without restrictions next week.  At that point we will utilize the boot primarily out of work.  She was casted today for custom molded foot orthoses to maintain the medial longitudinal arch support her plantar fascia and they will prevent recurrence long-term for her for pronation deformity.  She will be notified when these are ready for pickup.  Referral sent to [facility] in [facility] for physical therapy.        No follow-ups on file.

## 2024-10-29 NOTE — Telephone Encounter (Signed)
 PT order and demographics faxed to [facility] in [place] fax [phone]

## 2024-11-04 ENCOUNTER — Ambulatory Visit: Admitting: Certified Nurse Midwife

## 2024-11-04 ENCOUNTER — Encounter: Payer: Self-pay | Admitting: Certified Nurse Midwife

## 2024-11-04 VITALS — BP 110/75 | HR 80 | Ht 63.0 in | Wt 200.9 lb

## 2024-11-04 DIAGNOSIS — Z6835 Body mass index (BMI) 35.0-35.9, adult: Secondary | ICD-10-CM

## 2024-11-04 DIAGNOSIS — E66811 Obesity, class 1: Secondary | ICD-10-CM

## 2024-11-04 DIAGNOSIS — Z713 Dietary counseling and surveillance: Secondary | ICD-10-CM | POA: Diagnosis not present

## 2024-11-04 DIAGNOSIS — Z7689 Persons encountering health services in other specified circumstances: Secondary | ICD-10-CM

## 2024-11-04 MED ORDER — MOUNJARO 2.5 MG/0.5ML ~~LOC~~ SOAJ: 2.5000 mg | SUBCUTANEOUS | 0 refills | Status: AC

## 2024-11-04 NOTE — Progress Notes (Signed)
     Subjective:  Danielle Gillespie is a 33 y.o. G1P1001 at Unknown being seen today for weight loss management- visit.   She has been seen in the past by Dr. Connell. She has used wagovy and Monjoro. States that the wagovy gave her to much GI upset. She is requesting to start on the Mounjoro.   Patient reports General ROS: negative and reports previous weight loss attempts:  Onset was sudden/gradual over the past 2 yrs. She state it is due to poor diet, life stress ( her bother died) , and foot injury.   Associated symptoms include: fatigue, depression, anxiety, change in clothing fit and menstrual changes. Previous/Current treatment includes: small frequent feedings,  Pertinent medical history includes:none   Risk factors include: injurt to her foot , making it difficult to exercise.   Pertinent social history includes: none Past treatment has included: small frequent feedings, wagovy, exercise The following portions of the patient's history were reviewed and updated as appropriate: allergies, current medications, past family history, past medical history, past social history, past surgical history and problem list.   Objective:   Vitals:   11/04/24 1530  BP: 110/75  Pulse: 80  Weight: 200 lb 14.4 oz (91.1 kg)  Height: 5' 3 (1.6 m)    General:  Alert, oriented and cooperative. Patient is in no acute distress.  PE: Well groomed female in no current distress,   Mental Status: Normal mood and affect. Normal behavior. Normal judgment and thought content.   Current BMI: Body mass index is 35.59 kg/m.   Assessment and Plan:  Obesity  1. Encounter for weight management (Primary)  2. Obesity (BMI 30.0-34.9)  Orders placed 2.5 mg weekly. Pt counseled to use back up birth control ( condom).   Plan: low carb, High protein diet RX for adipex 37.5 mg daily and B12 1000mcg.ml monthly, to start now with first injection given at today's visit. Reviewed side-effects common to both  medications and expected outcomes. Increase daily water intake to at least 8 bottle a day, every day.  Goal is to reduse weight by 10% by end of three months, and will re-evaluate then.  RTC in 4 weeks for Nurse visit to check weight & BP, and get next B12 injections.    Please refer to After Visit Summary for other counseling recommendations.    Sebastian Sham, CNM   Consider the Low Glycemic Index Diet and 6 smaller meals daily .  This boosts your metabolism and regulates your sugars:   Use the protein bar by [employer] because they have lots of fiber in them  Find the low carb flatbreads, tortillas and pita breads for sandwiches:  Joseph's makes a pita bread and a flat bread , available at [facility] and [employer]; Toufayah makes a low carb flatbread available at [employer] and [employer] that is 9 net carbs and 100 cal Mission makes a low carb whole wheat tortilla available at [employer] most grocery stores with 6 net carbs and 210 cal  Greek yogurt can still have a lot of carbs .  Dannon Light N fit has 80 cal and 8 carbs

## 2024-11-04 NOTE — Patient Instructions (Signed)

## 2024-11-21 ENCOUNTER — Telehealth: Payer: Self-pay

## 2024-11-21 NOTE — Telephone Encounter (Signed)
 Patient's orthotics are in and balance is $0. Patient's orthotics are in the [place] bin ready to be sent to [place].

## 2024-12-16 ENCOUNTER — Telehealth: Payer: Self-pay | Admitting: Lab

## 2024-12-16 NOTE — Telephone Encounter (Signed)
 Inquiring about orthotics please advise.

## 2024-12-25 ENCOUNTER — Other Ambulatory Visit (HOSPITAL_COMMUNITY): Payer: Self-pay

## 2024-12-25 ENCOUNTER — Telehealth: Payer: Self-pay | Admitting: Pharmacy Technician

## 2024-12-25 NOTE — Telephone Encounter (Signed)
 Pharmacy Patient Advocate Encounter   Received notification from [facility] charts that prior authorization for Mounjaro  2.5mg /0.62ml auto-injectors is required/requested.   Insurance verification completed.   The patient is insured through [facility] MEDICAID.   Ozempic /Mounjaro  is approved exclusively as an adjunct to diet and exercise to improve glycemic  control in adults with type 2 diabetes mellitus. A review of patient's medical chart reveals no  documented diagnosis of type 2 diabetes or an A1C indicative of diabetes. Therefore, they do not  currently meet the criteria for prior authorization of this medication. If clinically appropriate, alternative  options such as Saxenda, Zepbound , or Wegovy  may be considered for this patient.  Wegovy  has been reinstated as the preferred weight loss injectable on most [place] medicaid plans. Would you like me try an obtain an approval for Wegovy  for the patient?

## 2024-12-26 ENCOUNTER — Telehealth: Payer: Self-pay | Admitting: Pharmacy Technician

## 2024-12-26 ENCOUNTER — Other Ambulatory Visit (HOSPITAL_COMMUNITY): Payer: Self-pay

## 2024-12-26 NOTE — Telephone Encounter (Signed)
 Pharmacy Patient Advocate Encounter   Received notification from Physician's Office that prior authorization for Wegovy  0.25mg /0.71ml auto-injectors is required/requested.   Insurance verification completed.   The patient is insured through [facility] MEDICAID.   Per test claim: PA required; PA submitted to above mentioned insurance via Latent Key/confirmation #/EOC [facility] Status is pending

## 2024-12-26 NOTE — Telephone Encounter (Signed)
 Pharmacy Patient Advocate Encounter  Received notification from [facility] MEDICAID that Prior Authorization for Wegovy  0.25mg /0.54ml auto-injectors  has been DENIED.  Full denial letter will be uploaded to the media tab. See denial reason below.   PA #/Case ID/Reference #: [id number]  You can schedule patient for an office visit to obtain a weight within the last 45 days as requested by the plan and to discuss current and continued lifestyle modifications including structured nutrition and physical activity. We can resubmit/appeal the PA after the office visit.
